# Patient Record
Sex: Male | Born: 1945 | Race: Black or African American | Hispanic: No | Marital: Married | Smoking: Never smoker
Health system: Southern US, Community
[De-identification: ages and names within clinical notes are randomized; demographics above are authoritative.]

## PROBLEM LIST (undated history)

## (undated) DIAGNOSIS — E785 Hyperlipidemia, unspecified: Secondary | ICD-10-CM

## (undated) DIAGNOSIS — M1990 Unspecified osteoarthritis, unspecified site: Secondary | ICD-10-CM

## (undated) DIAGNOSIS — I10 Essential (primary) hypertension: Secondary | ICD-10-CM

## (undated) DIAGNOSIS — T7840XA Allergy, unspecified, initial encounter: Secondary | ICD-10-CM

## (undated) DIAGNOSIS — K219 Gastro-esophageal reflux disease without esophagitis: Secondary | ICD-10-CM

## (undated) DIAGNOSIS — E119 Type 2 diabetes mellitus without complications: Secondary | ICD-10-CM

## (undated) HISTORY — PX: COLONOSCOPY: SHX174

## (undated) HISTORY — DX: Allergy, unspecified, initial encounter: T78.40XA

## (undated) HISTORY — DX: Unspecified osteoarthritis, unspecified site: M19.90

## (undated) HISTORY — DX: Hyperlipidemia, unspecified: E78.5

## (undated) HISTORY — DX: Gastro-esophageal reflux disease without esophagitis: K21.9

## (undated) HISTORY — DX: Essential (primary) hypertension: I10

## (undated) HISTORY — PX: KNEE SURGERY: SHX244

---

## 2001-10-09 ENCOUNTER — Encounter: Admission: RE | Admit: 2001-10-09 | Discharge: 2001-10-09 | Payer: Self-pay | Admitting: Occupational Medicine

## 2001-10-09 ENCOUNTER — Encounter: Payer: Self-pay | Admitting: Occupational Medicine

## 2002-02-11 ENCOUNTER — Encounter: Admission: RE | Admit: 2002-02-11 | Discharge: 2002-02-11 | Payer: Self-pay | Admitting: Occupational Medicine

## 2002-02-11 ENCOUNTER — Encounter: Payer: Self-pay | Admitting: Occupational Medicine

## 2003-09-18 HISTORY — PX: COLON SURGERY: SHX602

## 2004-07-07 ENCOUNTER — Encounter (INDEPENDENT_AMBULATORY_CARE_PROVIDER_SITE_OTHER): Payer: Self-pay | Admitting: *Deleted

## 2004-07-07 ENCOUNTER — Inpatient Hospital Stay (HOSPITAL_COMMUNITY): Admission: AC | Admit: 2004-07-07 | Discharge: 2004-07-21 | Payer: Self-pay

## 2004-07-07 ENCOUNTER — Ambulatory Visit: Payer: Self-pay | Admitting: Internal Medicine

## 2004-07-14 ENCOUNTER — Encounter: Payer: Self-pay | Admitting: Cardiovascular Disease

## 2004-07-24 ENCOUNTER — Ambulatory Visit: Payer: Self-pay | Admitting: Internal Medicine

## 2004-07-25 ENCOUNTER — Ambulatory Visit: Payer: Self-pay | Admitting: Internal Medicine

## 2004-07-27 ENCOUNTER — Ambulatory Visit: Payer: Self-pay | Admitting: Internal Medicine

## 2004-07-28 ENCOUNTER — Ambulatory Visit: Payer: Self-pay | Admitting: Family Medicine

## 2004-08-07 ENCOUNTER — Ambulatory Visit: Payer: Self-pay | Admitting: Family Medicine

## 2004-08-29 ENCOUNTER — Ambulatory Visit: Payer: Self-pay | Admitting: Family Medicine

## 2004-09-05 ENCOUNTER — Ambulatory Visit: Payer: Self-pay | Admitting: Family Medicine

## 2004-09-13 ENCOUNTER — Ambulatory Visit: Payer: Self-pay | Admitting: Family Medicine

## 2004-09-17 HISTORY — PX: COLON SURGERY: SHX602

## 2004-09-20 ENCOUNTER — Ambulatory Visit: Payer: Self-pay | Admitting: Family Medicine

## 2004-09-22 ENCOUNTER — Ambulatory Visit (HOSPITAL_COMMUNITY): Admission: RE | Admit: 2004-09-22 | Discharge: 2004-09-22 | Payer: Self-pay | Admitting: General Surgery

## 2004-09-26 ENCOUNTER — Ambulatory Visit (HOSPITAL_COMMUNITY): Admission: RE | Admit: 2004-09-26 | Discharge: 2004-09-26 | Payer: Self-pay | Admitting: General Surgery

## 2004-09-27 ENCOUNTER — Ambulatory Visit: Payer: Self-pay | Admitting: Family Medicine

## 2004-10-11 ENCOUNTER — Ambulatory Visit: Payer: Self-pay | Admitting: Family Medicine

## 2004-10-19 ENCOUNTER — Encounter (INDEPENDENT_AMBULATORY_CARE_PROVIDER_SITE_OTHER): Payer: Self-pay | Admitting: *Deleted

## 2004-10-19 ENCOUNTER — Inpatient Hospital Stay (HOSPITAL_COMMUNITY): Admission: RE | Admit: 2004-10-19 | Discharge: 2004-10-24 | Payer: Self-pay | Admitting: General Surgery

## 2004-11-01 ENCOUNTER — Ambulatory Visit: Payer: Self-pay | Admitting: Family Medicine

## 2004-11-07 ENCOUNTER — Ambulatory Visit: Payer: Self-pay | Admitting: Family Medicine

## 2004-11-10 ENCOUNTER — Emergency Department (HOSPITAL_COMMUNITY): Admission: EM | Admit: 2004-11-10 | Discharge: 2004-11-10 | Payer: Self-pay | Admitting: Emergency Medicine

## 2004-12-05 ENCOUNTER — Ambulatory Visit: Payer: Self-pay | Admitting: Family Medicine

## 2005-01-05 ENCOUNTER — Ambulatory Visit: Payer: Self-pay | Admitting: Family Medicine

## 2005-01-22 ENCOUNTER — Ambulatory Visit: Payer: Self-pay | Admitting: Internal Medicine

## 2005-02-05 ENCOUNTER — Ambulatory Visit: Payer: Self-pay | Admitting: Family Medicine

## 2005-02-21 ENCOUNTER — Ambulatory Visit: Payer: Self-pay | Admitting: Internal Medicine

## 2005-02-28 ENCOUNTER — Ambulatory Visit: Payer: Self-pay | Admitting: Internal Medicine

## 2005-10-18 ENCOUNTER — Ambulatory Visit: Payer: Self-pay | Admitting: Family Medicine

## 2006-02-06 ENCOUNTER — Ambulatory Visit: Payer: Self-pay | Admitting: Family Medicine

## 2006-04-02 ENCOUNTER — Ambulatory Visit: Payer: Self-pay | Admitting: Family Medicine

## 2006-04-09 ENCOUNTER — Ambulatory Visit: Payer: Self-pay | Admitting: Family Medicine

## 2006-04-16 ENCOUNTER — Encounter: Admission: RE | Admit: 2006-04-16 | Discharge: 2006-04-16 | Payer: Self-pay | Admitting: Family Medicine

## 2006-04-23 ENCOUNTER — Ambulatory Visit: Payer: Self-pay | Admitting: Family Medicine

## 2006-06-24 ENCOUNTER — Ambulatory Visit: Payer: Self-pay | Admitting: Family Medicine

## 2007-03-13 ENCOUNTER — Encounter: Payer: Self-pay | Admitting: Family Medicine

## 2007-03-13 DIAGNOSIS — I10 Essential (primary) hypertension: Secondary | ICD-10-CM | POA: Insufficient documentation

## 2007-03-13 DIAGNOSIS — K219 Gastro-esophageal reflux disease without esophagitis: Secondary | ICD-10-CM

## 2007-03-13 DIAGNOSIS — E785 Hyperlipidemia, unspecified: Secondary | ICD-10-CM

## 2007-03-13 DIAGNOSIS — Z86718 Personal history of other venous thrombosis and embolism: Secondary | ICD-10-CM

## 2007-03-31 ENCOUNTER — Ambulatory Visit: Payer: Self-pay | Admitting: Family Medicine

## 2007-03-31 LAB — CONVERTED CEMR LAB
ALT: 22 units/L (ref 0–53)
AST: 16 units/L (ref 0–37)
Basophils Relative: 0.2 % (ref 0.0–1.0)
Bilirubin, Direct: 0.1 mg/dL (ref 0.0–0.3)
Calcium: 9.4 mg/dL (ref 8.4–10.5)
Chloride: 102 meq/L (ref 96–112)
Creatinine, Ser: 1.3 mg/dL (ref 0.4–1.5)
Creatinine,U: 157.4 mg/dL
Eosinophils Relative: 2.5 % (ref 0.0–5.0)
Glucose, Bld: 118 mg/dL — ABNORMAL HIGH (ref 70–99)
HCT: 43.5 % (ref 39.0–52.0)
MCV: 85.8 fL (ref 78.0–100.0)
Neutrophils Relative %: 51.1 % (ref 43.0–77.0)
PSA: 0.24 ng/mL (ref 0.10–4.00)
RBC: 5.07 M/uL (ref 4.22–5.81)
RDW: 13.2 % (ref 11.5–14.6)
Sodium: 140 meq/L (ref 135–145)
TSH: 1.16 microintl units/mL (ref 0.35–5.50)
Total Bilirubin: 0.9 mg/dL (ref 0.3–1.2)
Total CHOL/HDL Ratio: 6.1
Triglycerides: 124 mg/dL (ref 0–149)
VLDL: 25 mg/dL (ref 0–40)
WBC: 6.2 10*3/uL (ref 4.5–10.5)

## 2007-04-07 ENCOUNTER — Ambulatory Visit: Payer: Self-pay | Admitting: Family Medicine

## 2007-04-07 DIAGNOSIS — N529 Male erectile dysfunction, unspecified: Secondary | ICD-10-CM | POA: Insufficient documentation

## 2007-04-17 ENCOUNTER — Encounter: Payer: Self-pay | Admitting: Family Medicine

## 2007-04-17 ENCOUNTER — Telehealth: Payer: Self-pay | Admitting: Family Medicine

## 2007-05-07 DIAGNOSIS — M67919 Unspecified disorder of synovium and tendon, unspecified shoulder: Secondary | ICD-10-CM | POA: Insufficient documentation

## 2007-05-07 DIAGNOSIS — M719 Bursopathy, unspecified: Secondary | ICD-10-CM

## 2007-07-07 ENCOUNTER — Ambulatory Visit: Payer: Self-pay | Admitting: Family Medicine

## 2008-04-02 ENCOUNTER — Ambulatory Visit: Payer: Self-pay | Admitting: Family Medicine

## 2008-04-06 ENCOUNTER — Ambulatory Visit: Payer: Self-pay | Admitting: Family Medicine

## 2008-04-06 LAB — CONVERTED CEMR LAB
AST: 37 units/L (ref 0–37)
Albumin: 4.2 g/dL (ref 3.5–5.2)
BUN: 19 mg/dL (ref 6–23)
Basophils Absolute: 0 10*3/uL (ref 0.0–0.1)
Basophils Relative: 0.7 % (ref 0.0–3.0)
Bilirubin Urine: NEGATIVE
Blood in Urine, dipstick: NEGATIVE
Calcium: 9.7 mg/dL (ref 8.4–10.5)
Cholesterol: 165 mg/dL (ref 0–200)
Creatinine, Ser: 1.2 mg/dL (ref 0.4–1.5)
Creatinine,U: 126 mg/dL
Eosinophils Absolute: 0.1 10*3/uL (ref 0.0–0.7)
Eosinophils Relative: 1.5 % (ref 0.0–5.0)
GFR calc Af Amer: 79 mL/min
GFR calc non Af Amer: 66 mL/min
Glucose, Urine, Semiquant: 100
HCT: 44.3 % (ref 39.0–52.0)
Hgb A1c MFr Bld: 8.9 % — ABNORMAL HIGH (ref 4.6–6.0)
MCHC: 34.4 g/dL (ref 30.0–36.0)
MCV: 87.4 fL (ref 78.0–100.0)
Microalb Creat Ratio: 10.3 mg/g (ref 0.0–30.0)
Microalb, Ur: 1.3 mg/dL (ref 0.0–1.9)
Monocytes Absolute: 0.6 10*3/uL (ref 0.1–1.0)
Neutro Abs: 2 10*3/uL (ref 1.4–7.7)
Neutrophils Relative %: 42.8 % — ABNORMAL LOW (ref 43.0–77.0)
PSA: 0.2 ng/mL (ref 0.10–4.00)
Protein, U semiquant: NEGATIVE
RBC: 5.07 M/uL (ref 4.22–5.81)
Specific Gravity, Urine: 1.02
Total Bilirubin: 1.3 mg/dL — ABNORMAL HIGH (ref 0.3–1.2)
VLDL: 34 mg/dL (ref 0–40)
WBC Urine, dipstick: NEGATIVE
WBC: 4.7 10*3/uL (ref 4.5–10.5)
pH: 5.5

## 2008-04-20 ENCOUNTER — Ambulatory Visit: Payer: Self-pay | Admitting: Family Medicine

## 2008-04-21 ENCOUNTER — Telehealth: Payer: Self-pay | Admitting: Family Medicine

## 2008-04-27 ENCOUNTER — Ambulatory Visit: Payer: Self-pay | Admitting: Family Medicine

## 2008-06-07 ENCOUNTER — Telehealth: Payer: Self-pay | Admitting: *Deleted

## 2008-07-22 ENCOUNTER — Ambulatory Visit: Payer: Self-pay | Admitting: Family Medicine

## 2008-07-22 LAB — CONVERTED CEMR LAB
BUN: 20 mg/dL (ref 6–23)
Creatinine, Ser: 1.2 mg/dL (ref 0.4–1.5)
GFR calc Af Amer: 79 mL/min
Glucose, Bld: 92 mg/dL (ref 70–99)
Potassium: 4.6 meq/L (ref 3.5–5.1)

## 2008-07-26 ENCOUNTER — Ambulatory Visit: Payer: Self-pay | Admitting: Family Medicine

## 2008-12-10 ENCOUNTER — Telehealth: Payer: Self-pay | Admitting: Family Medicine

## 2009-04-13 ENCOUNTER — Ambulatory Visit: Payer: Self-pay | Admitting: Family Medicine

## 2009-04-13 LAB — CONVERTED CEMR LAB
ALT: 42 units/L (ref 0–53)
AST: 30 units/L (ref 0–37)
Albumin: 4.1 g/dL (ref 3.5–5.2)
BUN: 22 mg/dL (ref 6–23)
Chloride: 109 meq/L (ref 96–112)
Creatinine, Ser: 1.3 mg/dL (ref 0.4–1.5)
GFR calc non Af Amer: 72.07 mL/min (ref >60)
Glucose, Bld: 77 mg/dL (ref 70–99)
Hgb A1c MFr Bld: 7.1 % — ABNORMAL HIGH (ref 4.6–6.5)
Potassium: 4.6 meq/L (ref 3.5–5.1)

## 2009-04-19 ENCOUNTER — Ambulatory Visit: Payer: Self-pay | Admitting: Family Medicine

## 2009-06-06 ENCOUNTER — Encounter: Payer: Self-pay | Admitting: Family Medicine

## 2009-06-23 ENCOUNTER — Telehealth: Payer: Self-pay | Admitting: Family Medicine

## 2009-07-18 ENCOUNTER — Telehealth: Payer: Self-pay | Admitting: Family Medicine

## 2009-07-25 ENCOUNTER — Telehealth: Payer: Self-pay | Admitting: Family Medicine

## 2009-07-28 ENCOUNTER — Ambulatory Visit: Payer: Self-pay | Admitting: Family Medicine

## 2009-07-28 LAB — CONVERTED CEMR LAB
Albumin: 4.2 g/dL (ref 3.5–5.2)
Alkaline Phosphatase: 48 units/L (ref 39–117)
Basophils Relative: 0.4 % (ref 0.0–3.0)
CO2: 33 meq/L — ABNORMAL HIGH (ref 19–32)
Chloride: 105 meq/L (ref 96–112)
Eosinophils Absolute: 0.1 10*3/uL (ref 0.0–0.7)
Glucose, Urine, Semiquant: NEGATIVE
HCT: 45.7 % (ref 39.0–52.0)
Hemoglobin: 15 g/dL (ref 13.0–17.0)
Lymphocytes Relative: 41.4 % (ref 12.0–46.0)
Lymphs Abs: 2.1 10*3/uL (ref 0.7–4.0)
MCHC: 32.9 g/dL (ref 30.0–36.0)
MCV: 90.2 fL (ref 78.0–100.0)
Microalb, Ur: 1.4 mg/dL (ref 0.0–1.9)
Monocytes Absolute: 0.5 10*3/uL (ref 0.1–1.0)
Neutro Abs: 2.3 10*3/uL (ref 1.4–7.7)
Nitrite: NEGATIVE
Potassium: 4.7 meq/L (ref 3.5–5.1)
RBC: 5.06 M/uL (ref 4.22–5.81)
Sodium: 145 meq/L (ref 135–145)
Specific Gravity, Urine: 1.02
Total CHOL/HDL Ratio: 5
Total Protein: 6.8 g/dL (ref 6.0–8.3)

## 2009-08-01 ENCOUNTER — Telehealth: Payer: Self-pay | Admitting: Family Medicine

## 2009-08-03 ENCOUNTER — Ambulatory Visit: Payer: Self-pay | Admitting: Family Medicine

## 2009-08-22 ENCOUNTER — Encounter (INDEPENDENT_AMBULATORY_CARE_PROVIDER_SITE_OTHER): Payer: Self-pay | Admitting: *Deleted

## 2009-10-28 ENCOUNTER — Ambulatory Visit: Payer: Self-pay | Admitting: Family Medicine

## 2009-10-28 LAB — CONVERTED CEMR LAB
CO2: 31 meq/L (ref 19–32)
Calcium: 9.4 mg/dL (ref 8.4–10.5)
Glucose, Bld: 97 mg/dL (ref 70–99)
Potassium: 4.4 meq/L (ref 3.5–5.1)
Sodium: 140 meq/L (ref 135–145)

## 2009-11-07 ENCOUNTER — Ambulatory Visit: Payer: Self-pay | Admitting: Family Medicine

## 2010-10-19 NOTE — Assessment & Plan Note (Signed)
Summary: 3 month rov/njr/pt rsc from bmp/cjr   Vital Signs:  Patient profile:   65 year old male Weight:      250 pounds Temp:     98.8 degrees F oral BP sitting:   122 / 82  (left arm) Cuff size:   regular  Vitals Entered By: Kern Reap CMA Duncan Dull) (November 07, 2009 1:51 PM)  Reason for Visit follow up dm  History of Present Illness: Billy Hansen is a 65 y/o male NS w DM the type1 comes in today for evaluation and have diabetes.  His current dose is Glucophage 1000 mg b.i.d., Glucotrol, 10 mg b.i.d. NovoLog 20 units nightly morning.  Blood sugars around 100, hemoglobin A1c7.0.  We discussed various options, including splitting the dose.  However, for now will increase it to 24 units nightly to see if we can't get his A1c back below 6.5, and only have to take one shot daily  Diabetes Management History:      He says that he is exercising.  Type of exercise includes: walk.  He is doing this 5 times per week.    Allergies: 1)  ! * Aleve  Past History:  Past medical, surgical, family and social histories (including risk factors) reviewed, and no changes noted (except as noted below).  Past Medical History: Reviewed history from 04/20/2008 and no changes required. Diabetes mellitus, type II GERD Hypertension ED Pulmonary embolism, hx of HX HEMATOCHEZIA Hyperlipidemia perforated colon from a leg of a chair to penetrate his rectum Diabetes mellitus, type I  Past Surgical History: Reviewed history from 03/13/2007 and no changes required. FX L KNEE  RT KNEE SCOPE CARTILAGE PERIRECTAL TEAR -FELL ON CHAIR-HAD COLOSTOMY WITH HARTMAN'S POUCH  COLOSTOMY REVERSAL  Family History: Reviewed history from 04/20/2008 and no changes required. father died at 21, hypertension, stroke, and congestive heart failure.  Mother died of a stroke at age 32, 3 brothers, one sister in good health  Social History: Reviewed history from 04/20/2008 and no changes  required. Occupation: Divorced Never Smoked Alcohol use-no Drug use-no Regular exercise-yes  Review of Systems      See HPI  Physical Exam  General:  Well-developed,well-nourished,in no acute distress; alert,appropriate and cooperative throughout examination   Impression & Recommendations:  Problem # 1:  DIABETES MELLITUS, TYPE I (ICD-250.01) Assessment Improved  His updated medication list for this problem includes:    Aspir-81 81 Mg Tbec (Aspirin) .Marland Kitchen... Take 1 tablet by mouth once a day    Glucophage 1000 Mg Tabs (Metformin hcl) .Marland Kitchen... Take 1 tablet by mouth twice a day    Glucotrol 10 Mg Tabs (Glipizide) .Marland Kitchen... Take 1 tablet by mouth twice a day    Lisinopril 30 Mg Tabs (Lisinopril) .Marland Kitchen... 1 tab bid    Novolog Mix 70/30 70-30 % Susp (Insulin aspart prot & aspart) .Marland Kitchen... 20 units once daily    Lisinopril 30 Mg Tabs (Lisinopril) .Marland Kitchen... Take 1 tablet by mouth two times a day  Complete Medication List: 1)  Aspir-81 81 Mg Tbec (Aspirin) .... Take 1 tablet by mouth once a day 2)  Glucophage 1000 Mg Tabs (Metformin hcl) .... Take 1 tablet by mouth twice a day 3)  Glucotrol 10 Mg Tabs (Glipizide) .... Take 1 tablet by mouth twice a day 4)  Hydrochlorothiazide 25 Mg Tabs (Hydrochlorothiazide) .Marland Kitchen.. 1 once daily 5)  Lisinopril 30 Mg Tabs (Lisinopril) .Marland Kitchen.. 1 tab bid 6)  Prilosec 20 Mg Cpdr (Omeprazole) .... Take 1 capsule by mouth every morning 7)  Viagra 100 Mg Tabs (Sildenafil citrate) .... Take as directed 8)  Bd Insulin Syringe U-100 1 Ml Misc (Insulin syringes (disposable)) .... Use two times a day 9)  Accu-chek Compact Strp (Glucose blood) .... Use one in the morning to check fasing blood sugar  dx 250.0 10)  Novolog Mix 70/30 70-30 % Susp (Insulin aspart prot & aspart) .... 20 units once daily 11)  Lisinopril 30 Mg Tabs (Lisinopril) .... Take 1 tablet by mouth two times a day 12)  Zocor 40 Mg Tabs (Simvastatin) .Marland Kitchen.. 1 tab @ bedtime  Patient Instructions: 1)  increase the insulin  dose to 24 units at bedtime, continue the oral medications 2)  Please schedule a follow-up appointment in 3 months. 250.01 3)  BMP prior to visit, ICD-9: 4)  HbgA1C prior to visit, ICD-9:

## 2011-02-02 NOTE — Discharge Summary (Signed)
NAME:  Billy Hansen, Billy Hansen NO.:  [id number]   MEDICAL RECORD NO.:  [id number]          PATIENT TYPE:  INP   LOCATION:  5708                         FACILITY:  [facility]   PHYSICIAN:  Cherylynn Ridges, M.D.    DATE OF BIRTH:  07/18/1946   DATE OF ADMISSION:  10/19/2004  DATE OF DISCHARGE:  10/24/2004                                 DISCHARGE SUMMARY   ADMITTING TRAUMA SURGEON:  Cherylynn Ridges, M.D.   CONSULTANTS:  None.   DISCHARGE DIAGNOSIS:  Status post reversal colostomy.   PROCEDURE:  Reversal colostomy.   HISTORY ON ADMISSION:  Billy Hansen is well known to the trauma service from  a previous admission back in late October 2005 when he was injured,  suffering a perirectal wound.  He had to undergo exploratory laparotomy  which revealed a rectal tear at about 20 cm.  He underwent a diverting end  colostomy with Luz Brazen pouch per Dr. Lindie Spruce and Dr. Janee Morn and had no  complications following those.  During his hospitalization it was discovered  he had significant untreated hypertension and hyperglycemia.  These were  addressed by the medicine service.  He had a further complication of  pulmonary embolus and was placed on heparin and then later Coumadin.  He was  followed in the trauma clinic over the past several months and has done  quite well.  It was felt that he should do well with takedown of his  colostomy and he was admitted for this reason on October 19, 2004.  He was,  of course, taken off his Coumadin in the preoperative period and his INR had  normalized prior to his surgery.  He underwent this procedure without any  complications.  He had an obligatory ileus postoperatively but this rapidly  resolved.  He was initially kept off of all anticoagulants but was able to  be restarted on subcutaneous heparin on postop day #2 and then his Coumadin  was resumed on postoperative day #4.  He has tolerated all this extremely  well, his incision is healing well and he is  tolerating a regular diet and  ready to be discharged at this time.   DISCHARGE MEDICATIONS:  1.  Coumadin 10 mg one p.o. daily.  2.  Vicodin one to two p.o. q.4-6h. p.r.n. more severe pain.  3.  Hydrochlorothiazide 25 mg one p.o. daily.  4.  Lisinopril 30 mg p.o. daily.  5.  Metoprolol 100 mg p.o. b.i.d.   He will need to follow up with his primary physician, Dr. Alonza Smoker, for pro  time/INR next week.  He will follow up with the trauma service week on  October 31, 2004 at approximately 9:30 a.m. for staple removal and recheck  following his takedown colostomy.    SR/MEDQ  D:  10/24/2004  T:  10/24/2004  Job:  [id number]

## 2011-02-02 NOTE — Op Note (Signed)
NAME:  Billy Hansen, Billy Hansen NO.:  [id number]   MEDICAL RECORD NO.:  [id number]          PATIENT TYPE:  OBV   LOCATION:  5733                         FACILITY:  [facility]   PHYSICIAN:  Jimmye Norman III, M.D.  DATE OF BIRTH:  03/09/1946   DATE OF PROCEDURE:  07/07/2004  DATE OF DISCHARGE:                                 OPERATIVE REPORT   PREOPERATIVE DIAGNOSIS:  Rectal and perirectal tear status post accidental  fall on impelling chair leg.   POSTOPERATIVE DIAGNOSIS:  Rectal and perirectal tear status post accidental  fall on impelling chair leg with rectal tear at about 20 centimeters.   OPERATION PERFORMED:  1.  Rigid sigmoidoscopy.  2.  Exploration, debridement and repair of perirectal and rectal tear.  3.  Diverting end colostomy with Hartmann pouch.   SURGEON:  Jimmye Norman, M.D.   ASSISTANT:  Gabrielle Dare. Janee Morn, M.D.   ANESTHESIA:  General endotracheal.   ESTIMATED BLOOD LOSS:  A hundred to 150 mL.   COMPLICATIONS:  None.   CONDITION:  Stable.   FINDINGS:  The patient had a perirectal tear, which came in the right  gluteal cheek near the rectum, extended up the presacral area about half a  centimeter from the anal verge, extending into the anal verge and causing  some tear of the serosa of the rectum at about ___________ meters based on  rigid sigmoidoscopy.  There was no intraperitoneal  perforation.   DESCRIPTION OF OPERATION:  The patient was brought to the operating room and  placed on the table initially in the supine position.  After an adequate  endotracheal anesthetic was administered he was placed in the lithotomy  position and prepped after a rigid sigmoidoscopy was done.   A rigid sigmoidoscopy was done with the patient in lithotomy position.  We  were able to pass the rigid scope up to 22 cm without difficulty.  There was  minimal trauma during the process with a good amount of lubrication and at  about 20 cm there was what appeared to be a  posteriorly lateral to the right  wall hematoma and/or laceration without full-thickness exposure, but what  appeared to be perhaps secondary to the impelled objected.  Also as we  brought to the sigmoidoscope out through the anal verge you could see tears  of the skin all the way up to the anal verge  making it at risk for  contamination with bowel movements; and, the decision was made to do a  repair with a drain and also a diverting end colostomy.   We irrigated the presacral area with copious amounts of warm  half strength  Betadine solution and then with saline solution.  This was done with a bulb  syringe; and, approximately a liter to a liter-and-a-half of saline and  Betadine were used.  The bulb was extended all the way into the presacral  area and then aspirated out.  We then placed a 19 [place] Blake drain into  the presacral space  and sutured it in place with a 2-0 nylon.  We closed  the tear  after debriding it and refreshing the edges.  The tear was started  at the mucosal anal verge and suture approximately 5-6 cm downward towards  the posterior wall using a running stitch of 3-0 chromic.  The rest of the  incision was about 6 cm long also down to the point where the drain came  out, sort of a gluteal T; was closed using a running 3-0 Prolene.  Again,  the drain itself was sutured in place with a 2-0 nylon.  Once this was done  a sterile dressing was applied and we went ahead and placed the patient's  legs down in a supine position, and did a colostomy.   We changed gowns and gloves, resterilized our hands and then prepped the  abdomen in the usual sterile manner for a colostomy.  A midline incision was  made using a #10 blade and it was taken from just above the umbilicus on the  right side down to the pubis.  We went down through the midline fascia using  cautery.  Once we were in the peritoneal cavity we had to figure out the  patient's anatomy carefully because he had a  very redundant sigmoid colon,  which looped up to the right and was distended from the previous  sigmoidoscopy.  It looped up to the right and then went back down towards  the rectum.  There was a redundant loop times two and as it came up to the  right again it went over to the left side of the abdomen where it continued  on with the descending colon.  We transected the midsigmoid colon using a  GIA-75 stapler and left the Hartmann pouch in the pelvis unmarked, but it  was so large, protuberant, that it came right up to the midline above the  bladder.   We had to mobilize the mesentery and the proximal sigmoid colon carefully as  it was very redundant and had a mesentery, which was attached to the right  side of the abdomen.  In doing so we did take some of the deeper mesenteric  vessels leaving a viable end to bring out through the stomal site, which was  placed on the left side.  As we mobilized the colon it did so easily and we  subsequently were able to bring it out through an ostomy site made in the  left lateral wall with the Kocher clamp and  #10 blade.  Once we closed the  fascia we secured it with 3-0 Vicryl pop off sutures.  We irrigated with  saline in the abdomen and then closed using a #1 PDS suture.  Once that was  done the skin was closed using stainless steel staples.   All counts were correct including needles, sutures, sponges and instruments.  Once this was done a sterile dressing was applied and as mentioned we cutoff  the distal 5 inches of colon that had been brought out through the left side  of the abdomen and then attached the transected mucosal edge to the skin,  which appeared to be viable and bled easily.       JW/MEDQ  D:  07/07/2004  T:  07/08/2004  Job:  [id number]

## 2011-02-02 NOTE — Letter (Signed)
August 19, 2006    Ree Kida B. Vanna Scotland., Esq.  [facility] [place], [place]  [address]  [place], [place] [address]   RE:  KRISS, ISHLER  MRN:  [id number]  /  DOB:  [birth year]   Dear Mr. Lennon Alstrom,   I am Mr. Julius Bowels family physician and I would like to comment  on his sequelae from the injury described from the fall at [facility].   Mr. Devera is a 65 year old male who is a primary care patient of mine  at [facility]. I have seen him since he was released from the  hospital following his injury dated July 07, 2004. As you know on  July 07, 2004, Mr. Novosel reports he was working at [employer]. 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 After being impaled for some time, he disimpaled  himself and went to seek help. He was taken to the emergency room and  subsequently admitted to the hospital and treated by Dr. Marta Lamas. Lindie Spruce,  III, MD for severe rectal and perirectal tear from the penetrating  wound. The leg of the chair penetrated through the skin, the muscle and  the perirectal area of his buttocks on the right side. During the first  surgical procedure, it was noted the chair caused a hematoma and  laceration of the right wall of the rectum. There was no full-thickness  perforation. This injury to the rectum did not require surgical repair.  During the first procedure, a [place] drain was placed into the  perisacral area and sutured in place. After debridement and trimming  away the edges, the tear was sutured for a distance of approximately 6  cm towards the posterior wall of the rectum. The rest of the incision  was about 6 cm long which was sutured down to a point where the drain  came out in a T-shaped area around the gluteal fold. Some tearing in the  anal verge ws also noted. Because of the nature of the  injury, it was  necessary to perform a diverting colostomy. This was the second  procedure performed on the same day after the repair of the perirectal  laceration. This diverting colostomy required an abdominal incision  through the skin of the anterior abdominal wall. The incision went from  just above the umbilicus to the right side of the pubic area. Dr. Lindie Spruce  at that time transected the mid sigmoid colon, excised approximately 5  inches of his colon. A second abdominal incision was made in the left  side of the abdomen so the colon could be attached to the ostomy site.  The abdomen was closed in layers and Mr. Lottman tolerated that surgical  procedure well.   He did experience a pulmonary emboli in the left lower lobe of the lung  which was a complication during his first hospitalization. This was  treated and he was placed on anticoagulation, did well and had no  sequelae.   After 2 weeks in the hospital, Mr. Boehning was discharged July 21, 2004 with a functioning colostomy. The healing of his rectal and  perirectal tear continued slowly but without significant incident.   Three months later on October 19, 2004, Dr. Lindie Spruce performed a second  surgery to take down the colostomy and reanastomose Mr. Pfiester  bowels.  During this procedure, Dr. Lindie Spruce noted extensive adhesions of  the small and large bowel which he was able to take down. Adhesions as  you well know are troublesome and not only troublesome but can cause  bowel obstruction later on. Formation of these adhesions is not an  uncommon event in light of his surgical procedures of the abdomen,  colostomy, etc.   During this second surgery on October 19, 2004, 6 inches of stoma was  resected and the large bowel was then reanastomosed. A few weeks after  that first surgical procedure on February 24, Mr. Lenhoff experienced  complications and returned to the hospital and was treated with IV  antibiotics for perirectal  cellulitis.   SUMMARY OF MR Samarin'S INJURIES:  1. After Mr. Sallade last complication, he continued to have      complications with rectal incontinence. He was sent to see our GI      folks who did a complete evaluation. They felt that over time, the      incontinence would resolve although he would never have 100%      function of his rectal sphincter because of the trauma. He also      will have long-term problems sitting for more than 15, 20 to 30      minutes at a time because of scarring around the perirectal area      from the surgical procedure and the trauma of the initial injury.  2. Mr. Longley also sustained significant damage to the skin and      muscles of the buttocks area. Because of its location, it would not      be a cosmetic issue; however, again he will have trouble sitting      for long periods of time and he is unable to lift anything over 50      pounds and therefore this limits what kind of work he can do.  3. Because of the tear in the anal verge in the wound, there is some      loss of function of the sphincter as noted above. He now has fairly      reasonable control of his bowels. He has to be extremely careful      what he eats and avoid constipation.  4. Because of the injury, he had a significant trauma to the distal      rectum. He was extremely fortunate that the area healed and the      colostomy was functioning well and that he was then able to be      reanastomosed but obviously this can cause problems in the future.  5. Because of the abdominal surgeries and then a second final      subsequent reanastomosis of his colostomy, there is some loss of      skin function in the anterior abdominal wall with scarring. This      causes some pruritus and some tenderness which he will experience      for a lifetime. He is also more at risk for developing a hernia in      that area because of the incision through the anterior abdominal     wall. Again he has  to be very careful about lifting and he cannot      lift anything much over 50 pounds.  6. Because of the scarring on the anterior abdominal wall from the      surgical procedures, Mr. Blew is much more  susceptible for back      pain. As you know, the anterior abdominal wall muscle supports 50%      of the back function and therefore this may be a problem in the      future. Again this limits heavy lifting which may limit the work he      can do.  7. Because of the multiple abdominal operations, Mr. Bisig developed      adhesions as noted above. Dr. Lindie Spruce attempted to remove all he      could see but that has potential for causing small bowel      obstruction in the future. When one presents with small bowel      infarction from adhesions, often times a portion of the intestine      has to be removed. These surgical procedures again put Mr. Worley      at high risk for having bowel obstruction in the future. In      addition, any further abdominal surgery that he may require in the      future is likely to be extremely problematic because of the      underlying adhesions that he now has.  8. The sigmoid colon had 2 surgical procedures including the initial      resection. Subsequent colostomy and then the reanastomosis and a      third operation. Because of this, again, it would be difficult in      the future to evaluate him for colon cancer via colonoscopy.  9. During his initial hospitalization, he was noted to have left lower      lobe pulmonary emboli which is a complication of pelvic trauma and      venous stasis. Fortunately this was diagnosed in early stage. He      underwent anticoagulation and was maintained on that for 6 months      subsequent to surgery and did well and had no pulmonary      complications at that time. However, we do know that people who      have pulmonary emboli are more prone to secondary infection, etc.      down the road.  10.Because of the trauma  to the pelvic area and the nerves around the      rectum and the prostate area, Mr. Fawver now is impotent. Prior to      that, he used half of a 50 mg Viagra tablet to help with his sexual      function. Subsequently medications have not worked and I believe      this is a secondary phenomena because of the trauma to the nerves      around the prostate area from the initial injury.   Multiple medications have been tried without improvement and I suspect  this will be a permanent problem.   So in summary, the areas that were injured are as follows:  1. Skin and muscles of the buttocks.  2. Rectal, perirectal and anal sphincter.  3. Trauma to the rectum as noted above.  4. Surgery with injury to the skin and muscles of the anterior      abdominal wall.  5. Surgical removal attachment of the stoma.  6. Surgery with injuries to the sigmoid colon.  7. Surgery with injuries to the abdominal wall and omentum.  8. Adhesions to the small bowel and large bowel. 9. Multiple pulmonary emboli of the left lower lung.  10.Scarring of the  buttocks and abdominal wall from surgical trauma.  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   Considering the trauma that Mr. Bigley experienced, I think he is  overall doing well. He is able to function, he is able to work, however,  he does have limitations as noted above which will be permanent.  However, because of the trauma, he will never be as he was before the  injury.   Case of Rosaire Cueto. [facility] vs[facility] file  [phone].    Sincerely,      Eugenio Hoes. Tawanna Cooler, MD  Electronically Signed    JAT/MedQ  DD: 08/19/2006  DT: 08/19/2006  Job #: [id number]

## 2011-02-02 NOTE — Op Note (Signed)
NAME:  Billy Hansen, GRUENHAGEN NO.:  [id number]   MEDICAL RECORD NO.:  [id number]          PATIENT TYPE:  INP   LOCATION:  2550                         FACILITY:  [facility]   PHYSICIAN:  Cherylynn Ridges III, M.D.DATE OF BIRTH:  [birth year]   DATE OF PROCEDURE:  10/19/2004  DATE OF DISCHARGE:                                 OPERATIVE REPORT   PREOPERATIVE DIAGNOSES:  Functioning colostomy status post, rectal injury.   POSTOPERATIVE DIAGNOSES:  Functioning colostomy status post, rectal injury.   OPERATION PERFORMED:  Takedown colostomy.   SURGEON:  Marta Lamas. Lindie Spruce, M.D.   ASSISTANT:  Adolph Pollack, M.D.   ANESTHESIA:  General endotracheal.   ESTIMATED BLOOD LOSS:  Less than 100 mL.   COMPLICATIONS:  None.   CONDITION:  Stable.   FINDINGS:  The patient had extensive adhesions of the large and small bowel  but no inadvertent enterotomies were made.   DESCRIPTION OF PROCEDURE:  The patient was taken to the operating room and  placed on the table in the supine position.  After an adequate endotracheal  anesthetic was administered, his colostomy was closed using a running  locking stitch of 2-0 silk.  We then prepped and draped in the usual sterile  manner exposing the midline and the colostomy site.   A midline incision was made using a #10 blade below the umbilicus excising  the patient's previously thickened and hypertrophic scar.  It was taken down  to and through the midline fascia.  Once we had gotten to the peritoneal  cavity and the upper portion of the wound without injury of bowel, we could  see extensive adhesions of the small bowel to the anterior abdominal wall to  the previous distal stump of the Hartmann's stump and also to other  surrounding intestines.  The hypertrophic reaction of scarring on the  patient's skin and at his incision was reminiscent of the scarring that was  noted intra-abdominally.   Considerable time was taken to remove down  adhesions from the ligament of  Treitz down to the terminal ileum but this was done.  We were subsequently  able to dissect out the colostomy from its site using a #10 blade then the  electrocautery to dissect it out of the subcu pocket.  There was extensive  scarring here also.  We eventually were able to get the stoma to go back  into the peritoneal cavity and we closed the stoma site at the end of the  case using internal simple stitches of #1 Vicryl and external fascial  stitches of #1 Vicryl.  Once the stoma was retrieved back into the  peritoneal cavity, we resected about six inches of the stomach to anastomose  to the distal sigmoid stump which was obvious in the lower portion of the  abdomen.  We freshened up both ends proximally and distally so that we could  get good serosal stitches of Lembert stitches of 3-0 silk.  This was done  attaching the back walls of the proximal and distal colon and then a full-  thickness and Connell stitch of 3-0  Vicryl was placed on the inner layer.  The anterior wall was reapproximated using 3-0 silk Lembert stitches and the  colon fell easily over into the left lower quadrant with minimal difficulty  with an adequate stoma.  Once the anastomosis was completed, we irrigated  with saline solution.  We could not get any of the omentum to fall back into  the peritoneal cavity as it was adhered in the left upper quadrant.  We did  palpate the NG tube which was in adequate position.   Once the abdomen was closed, then the stoma site was closed as mentioned  previously.  We irrigated with saline on both sides.  Then we closed the  midline fascia using a running #1 PDS suture.  The subcu was irrigated with  saline and the skin was closed using stainless steel staples.  Sterile  dressings were applied.  All sponge, needle and instrument counts were  correct at the conclusion of the case.      JOW/MEDQ  D:  10/19/2004  T:  10/19/2004  Job:  [id number]

## 2011-02-02 NOTE — Discharge Summary (Signed)
NAME:  Billy Hansen, Billy Hansen NO.:  [id number]   MEDICAL RECORD NO.:  [id number]          PATIENT TYPE:  INP   LOCATION:  5742                         FACILITY:  [facility]   PHYSICIAN:  Jimmye Norman, M.D.      DATE OF BIRTH:  [birth year]   DATE OF ADMISSION:  07/07/2004  DATE OF DISCHARGE:  07/21/2004                                 DISCHARGE SUMMARY   CONSULTING PHYSICIAN:  Medical Teaching Service.   FINAL DIAGNOSES:  1.  Fall.  2.  Penetrating puncture wound to perirectal area.  3.  Diabetes mellitus.  4.  Hypertension.   PROCEDURES:  Exploratory laparotomy with diverting colostomy performed on  July 07, 2004 by Dr. Jimmye Norman.   HISTORY:  This is a 65 year old African American male who was standing on  some chairs which were stacked up when he lost his balance and fell off and  an upturned chair leg caught him in the buttock area, and it penetrated just  lateral to the rectum.  The patient was brought to the [facility] Emergency  Room.  He was seen in the emergency room by Dr. Lindie Spruce.  Chest x-ray and  pelvis x-ray were negative.  He was subsequently taken to the OR for  exploration.   HOSPITAL COURSE:  He underwent exploration of the wound in the OR.  A  diverting colostomy was performed.  The penetrating chair leg caused some  bruising and swelling around the sigmoid area, but no puncture was noted.  We did a sigmoidoscopy and it was negative for any penetration of the  sigmoid.  The patient did tolerate the procedure well and no intraoperative  complications occurred.  Postoperatively, the patient's problems recurred  with hyperglycemia and hypertension.  The patient had known he was a mild  hypertensive initially, but he had been noncompliant with his medications.  At the time of arrival, he denied taking any medications.  What was noted  was that he did have an elevated glucose level too and a hemoglobin A1c was  performed which was elevated above normal.   Because of these findings,  medical teaching service was consulted to see the patient and they followed  [place] with the patient.  They treated him for his hypertension and for his  hyperglycemia.  He was started on Lantus subcu daily.  He had Glucotrol.  The patient was started on antihypertensives which were hydrochlorothiazide,  lisinopril and Lopressor.  He would start on Metformin later on after they  do some liver function tests.  He did have a liver function test which was  elevated and he subsequently ordered hepatitis profile which was pending at  the time of this dictation.  He was put on Coumadin and started on this, and  will continue on this for some time, as he did develop a PE while in the  hospital.  This did not cause any further complications while in the  hospital.   DISCHARGE MEDICATIONS:  He was on these medicines at the time of discharge:  1.  Hydrochlorothiazide 25 mg 1 daily.  2.  Lopressor  100 mg b.i.d.  3.  Lisinopril 60 mg 1 daily.  4.  Glucotrol 10 mg 1 daily.  5.  Lantus insulin 14 units subcu at bedtime.  6.  Coumadin 10 mg on Saturday, 7.5 mg on Sunday and Monday.  7.  The patient was given Percocet 1-2 p.o. q.4-6 h. p.r.n. for pain.   FOLLOWUP:  He will be followed up at the clinic with Dr. Judie Grieve  on Monday, the 7th of November, at 2:30 p.m.  He will also follow up with  Dr. Michaell Cowing in the [facility] for continued monitoring of his INR level.  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  He does have a colostomy which  is functioning well at the time of discharge and we will need to schedule a  time for a takedown in the next few months.   CONDITION ON DISCHARGE:  He is actually doing well at this time, tolerating  a diet satisfactorily.  He did have a postoperative ileus which did resolve  without any difficulty.  As noted, the colostomy is functioning   satisfactorily at this time.  No other problems occurred.  He was  subsequently discharged home in satisfactory and stable condition on  July 21, 2004.       CL/MEDQ  D:  07/21/2004  T:  07/22/2004  Job:  [id number]   cc:   Judie Grieve, MD  Fax: [phone]

## 2012-07-22 ENCOUNTER — Telehealth: Payer: Self-pay | Admitting: Family Medicine

## 2012-07-22 NOTE — Telephone Encounter (Signed)
Pt is on medicare now wondering will MD still see him. Last appt 2011.

## 2012-07-22 NOTE — Telephone Encounter (Signed)
Fleet Contras please call will continue to see no problem

## 2012-07-24 NOTE — Telephone Encounter (Signed)
lmom for pt to callback to sch appt °

## 2012-07-25 NOTE — Telephone Encounter (Signed)
Pt is sch for cpx 10-25-2011

## 2012-10-29 ENCOUNTER — Ambulatory Visit (INDEPENDENT_AMBULATORY_CARE_PROVIDER_SITE_OTHER): Payer: Medicare Other | Admitting: Family Medicine

## 2012-10-29 ENCOUNTER — Encounter: Payer: Self-pay | Admitting: Family Medicine

## 2012-10-29 VITALS — BP 160/100 | Temp 98.7°F | Ht 70.75 in | Wt 237.0 lb

## 2012-10-29 DIAGNOSIS — E785 Hyperlipidemia, unspecified: Secondary | ICD-10-CM

## 2012-10-29 DIAGNOSIS — E119 Type 2 diabetes mellitus without complications: Secondary | ICD-10-CM

## 2012-10-29 DIAGNOSIS — Z23 Encounter for immunization: Secondary | ICD-10-CM

## 2012-10-29 DIAGNOSIS — N529 Male erectile dysfunction, unspecified: Secondary | ICD-10-CM

## 2012-10-29 DIAGNOSIS — I10 Essential (primary) hypertension: Secondary | ICD-10-CM

## 2012-10-29 DIAGNOSIS — K219 Gastro-esophageal reflux disease without esophagitis: Secondary | ICD-10-CM

## 2012-10-29 DIAGNOSIS — Z0000 Encounter for general adult medical examination without abnormal findings: Secondary | ICD-10-CM

## 2012-10-29 LAB — CBC WITH DIFFERENTIAL/PLATELET
Eosinophils Relative: 2.6 % (ref 0.0–5.0)
HCT: 49.7 % (ref 39.0–52.0)
Hemoglobin: 16.4 g/dL (ref 13.0–17.0)
Lymphs Abs: 2 10*3/uL (ref 0.7–4.0)
Monocytes Relative: 9.6 % (ref 3.0–12.0)
Neutro Abs: 2.3 10*3/uL (ref 1.4–7.7)
RBC: 5.81 Mil/uL (ref 4.22–5.81)
WBC: 4.8 10*3/uL (ref 4.5–10.5)

## 2012-10-29 LAB — LIPID PANEL
HDL: 34.8 mg/dL — ABNORMAL LOW (ref >39.00)
Total CHOL/HDL Ratio: 6
Triglycerides: 193 mg/dL — ABNORMAL HIGH (ref 0.0–149.0)

## 2012-10-29 LAB — BASIC METABOLIC PANEL
Creatinine, Ser: 1 mg/dL (ref 0.4–1.5)
Glucose, Bld: 222 mg/dL — ABNORMAL HIGH (ref 70–99)
Potassium: 4.4 mEq/L (ref 3.5–5.1)
Sodium: 139 mEq/L (ref 135–145)

## 2012-10-29 LAB — POCT URINALYSIS DIPSTICK
Bilirubin, UA: NEGATIVE
Blood, UA: NEGATIVE
Ketones, UA: NEGATIVE
Leukocytes, UA: NEGATIVE
pH, UA: 5.5

## 2012-10-29 LAB — MICROALBUMIN / CREATININE URINE RATIO
Creatinine,U: 167.3 mg/dL
Microalb, Ur: 4.9 mg/dL — ABNORMAL HIGH (ref 0.0–1.9)

## 2012-10-29 LAB — PSA: PSA: 0.33 ng/mL (ref 0.10–4.00)

## 2012-10-29 LAB — HEMOGLOBIN A1C: Hgb A1c MFr Bld: 10.9 % — ABNORMAL HIGH (ref 4.6–6.5)

## 2012-10-29 LAB — HEPATIC FUNCTION PANEL: Total Bilirubin: 1.4 mg/dL — ABNORMAL HIGH (ref 0.3–1.2)

## 2012-10-29 MED ORDER — LISINOPRIL 20 MG PO TABS: 20.0000 mg | ORAL_TABLET | Freq: Every day | ORAL | Status: DC

## 2012-10-29 MED ORDER — SIMVASTATIN 20 MG PO TABS: 20.0000 mg | ORAL_TABLET | Freq: Every evening | ORAL | Status: DC

## 2012-10-29 MED ORDER — GLIPIZIDE 10 MG PO TABS: 10.0000 mg | ORAL_TABLET | Freq: Every day | ORAL | Status: DC

## 2012-10-29 MED ORDER — FUROSEMIDE 20 MG PO TABS: 20.0000 mg | ORAL_TABLET | Freq: Every day | ORAL | Status: DC

## 2012-10-29 MED ORDER — METFORMIN HCL 1000 MG PO TABS: 1000.0000 mg | ORAL_TABLET | Freq: Two times a day (BID) | ORAL | Status: DC

## 2012-10-29 NOTE — Progress Notes (Signed)
Subjective:    Patient ID: Billy Hansen, male    DOB: [birth year], 67 y.o.   MRN: [id number]  HPI Billy Hansen is a 67 year old married male nonsmoker who comes in today after a 3 year absence to reestablish. He lost his health insurance and has been seeing Dr. Bruna Potter for medication refills no recent lab work  He states he checked his blood sugar about a month ago was in the 200 range.  He's taking glipizide 10 mg daily and metformin 1000 mg twice a day Lasix 20 mg daily along with lisinopril 20 mg daily for hypertension and simvastatin 20 mg daily for hyperlipidemia  He had the one hospitalization about 10 years ago when he had an accident at work and the leg was chair when up his rectum. He had been reconstructed by Dr. Lindie Spruce  Does not get routine eye care recommended Dr. Gweneth Dimitri. He does not get regular dental care other he does brush and floss daily. He's had a colonoscopy in 04 due for followup  No history of any renal problems no erectile dysfunction he's lost weight he was down to 237. He's not been trying to lose weight. He had surgery on his right knee many years ago with arthritis.  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 Information given on shingles  Diabetic review of systems negative no symptoms of neuropathy   Review of Systems  Constitutional: Negative.   HENT: Negative.   Eyes: Negative.   Respiratory: Negative.   Cardiovascular: Negative.   Gastrointestinal: Negative.   Genitourinary: Negative.   Musculoskeletal: Negative.   Skin: Negative.   Neurological: Negative.   Psychiatric/Behavioral: Negative.        Objective:   Physical Exam  Constitutional: He is oriented to person, place, and time. He appears well-developed and well-nourished.  HENT:  Head: Normocephalic and atraumatic.  Right Ear: External  ear normal.  Left Ear: External ear normal.  Nose: Nose normal.  Mouth/Throat: Oropharynx is clear and moist.  Eyes: Conjunctivae and EOM are normal. Pupils are equal, round, and reactive to light.  Neck: Normal range of motion. Neck supple. No JVD present. No tracheal deviation present. No thyromegaly present.  Cardiovascular: Normal rate, regular rhythm, normal heart sounds and intact distal pulses.  Exam reveals no gallop and no friction rub.   No murmur heard. Vascular exam normal no carotid bruits aorta normal peripheral pulses normal  Pulmonary/Chest: Effort normal and breath sounds normal. No stridor. No respiratory distress. He has no wheezes. He has no rales. He exhibits no tenderness.  Abdominal: Soft. Bowel sounds are normal. He exhibits no distension and no mass. There is no tenderness. There is no rebound and no guarding.  Scar bid lower abdomen more he had the large bowel repair  Genitourinary: Rectum normal, prostate normal and penis normal. Guaiac negative stool. No penile tenderness.  Musculoskeletal: Normal range of motion. He exhibits no edema and no tenderness.  Lymphadenopathy:    He has no cervical adenopathy.  Neurological: He is alert and oriented to person, place, and time. He has normal reflexes. No cranial nerve deficit. He exhibits normal muscle tone.  No neuropathy  Skin: Skin is warm and dry. No rash noted. No erythema. No pallor.  Psychiatric: He has a normal mood and affect. His behavior is normal. Judgment and thought content normal.  Assessment & Plan:  Healthy male  Hypertension question goal continue lisinopril and Lasix BP check daily followup in 3 weeks  Hyperlipidemia check labs continue simvastatin and an aspirin tablet  Diabetes type 2 question goal continue glipizide and metformin..... Check labs

## 2012-10-29 NOTE — Patient Instructions (Signed)
Continue your current medications  We will call you on the phone to make any adjustments  Purchase a pump up digital blood pressure cuff and check your blood pressure every morning  Check a fasting blood sugar Monday Wednesday Friday  Return in 3 weeks for followup

## 2012-11-06 ENCOUNTER — Ambulatory Visit (INDEPENDENT_AMBULATORY_CARE_PROVIDER_SITE_OTHER): Payer: Medicare Other | Admitting: Family Medicine

## 2012-11-06 ENCOUNTER — Encounter: Payer: Self-pay | Admitting: Family Medicine

## 2012-11-06 VITALS — BP 130/90

## 2012-11-06 DIAGNOSIS — I10 Essential (primary) hypertension: Secondary | ICD-10-CM

## 2012-11-06 DIAGNOSIS — E119 Type 2 diabetes mellitus without complications: Secondary | ICD-10-CM

## 2012-11-06 NOTE — Progress Notes (Signed)
  Subjective:    Patient ID: Billy Hansen, male    DOB: 17-Aug-1946, 67 y.o.   MRN: [id number]  HPI Billy Hansen is a 67 year old male who comes in today for followup of diabetes and hypertension  We saw him last week after a three-year absence. His blood sugar was elevated and we do some basic labs. Times that his hemoglobin A1c was markedly elevated however he says he was only taking his medication every other day. Since he's been taking his medication daily his blood sugars decreased but only down to 230 range.  BP normal 130/90   Review of Systems Asymptomatic no neuropathy vision normal    Objective:   Physical Exam  Well-developed well-nourished male in no acute distress      Assessment & Plan:  Diabetes type 2 not at goal plan continue current meds

## 2012-11-06 NOTE — Patient Instructions (Addendum)
Continue your current diabetic medication  Really tighten up on your diet  Walk 30 minutes daily  Check a fasting blood sugar daily in the morning  Followup in 3 weeks with all your blood sugar readings

## 2012-11-19 ENCOUNTER — Ambulatory Visit: Payer: Medicare Other | Admitting: Family Medicine

## 2012-12-11 ENCOUNTER — Ambulatory Visit: Payer: Medicare Other | Admitting: Family Medicine

## 2012-12-18 ENCOUNTER — Other Ambulatory Visit: Payer: Self-pay | Admitting: *Deleted

## 2012-12-18 DIAGNOSIS — I10 Essential (primary) hypertension: Secondary | ICD-10-CM

## 2012-12-18 DIAGNOSIS — E119 Type 2 diabetes mellitus without complications: Secondary | ICD-10-CM

## 2012-12-18 DIAGNOSIS — E785 Hyperlipidemia, unspecified: Secondary | ICD-10-CM

## 2012-12-18 MED ORDER — FUROSEMIDE 20 MG PO TABS: 20.0000 mg | ORAL_TABLET | Freq: Every day | ORAL | Status: DC

## 2012-12-18 MED ORDER — METFORMIN HCL 1000 MG PO TABS: 1000.0000 mg | ORAL_TABLET | Freq: Two times a day (BID) | ORAL | Status: DC

## 2012-12-18 MED ORDER — SIMVASTATIN 20 MG PO TABS: 20.0000 mg | ORAL_TABLET | Freq: Every evening | ORAL | Status: DC

## 2012-12-18 MED ORDER — LISINOPRIL 20 MG PO TABS: 20.0000 mg | ORAL_TABLET | Freq: Every day | ORAL | Status: DC

## 2012-12-18 MED ORDER — GLIPIZIDE 10 MG PO TABS: 10.0000 mg | ORAL_TABLET | Freq: Every day | ORAL | Status: DC

## 2013-01-12 ENCOUNTER — Encounter: Payer: Self-pay | Admitting: Family Medicine

## 2013-01-12 ENCOUNTER — Ambulatory Visit (INDEPENDENT_AMBULATORY_CARE_PROVIDER_SITE_OTHER): Payer: Medicare Other | Admitting: Family Medicine

## 2013-01-12 VITALS — BP 140/90 | Temp 98.4°F | Wt 242.0 lb

## 2013-01-12 DIAGNOSIS — I10 Essential (primary) hypertension: Secondary | ICD-10-CM

## 2013-01-12 DIAGNOSIS — E119 Type 2 diabetes mellitus without complications: Secondary | ICD-10-CM

## 2013-01-12 MED ORDER — GLIPIZIDE 10 MG PO TABS: ORAL_TABLET | ORAL | Status: DC

## 2013-01-12 MED ORDER — LISINOPRIL 20 MG PO TABS: ORAL_TABLET | ORAL | Status: DC

## 2013-01-12 NOTE — Patient Instructions (Addendum)
Increase the lisinopril,,,,,,,, take 2 tabs daily in the morning  Increase the glipizide take one tab twice daily  Continue metformin one twice daily  Fasting blood sugar Monday Wednesday Friday  Return in 4 weeks for followup  Workout on your diet   Walk 30 minutes daily

## 2013-01-12 NOTE — Progress Notes (Signed)
  Subjective:    Patient ID: Billy Hansen, male    DOB: July 11, 1946, 67 y.o.   MRN: [id number]  HPI Billy Hansen is a 67 year old male who comes in today for followup of diabetes and hypertension  2 months ago his hemoglobin A1c went from normal to 10.9. His blood sugar is still elevated despite increasing his medication  His blood pressure is 140/90.   Review of Systems    review of systems otherwise negative no neuropathy Objective:   Physical Exam  Well-developed well-nourished male in no acute distress BP 140/90 foot exam normal pulses normal no neuropathy      Assessment & Plan:  Diabetes type 2 out of control increase Glucotrol 10 twice a day continue metformin 1000 mg twice a day followup in 2 months  Hypertension not at goal increase lisinopril to 40 mg daily

## 2013-02-16 ENCOUNTER — Ambulatory Visit (INDEPENDENT_AMBULATORY_CARE_PROVIDER_SITE_OTHER): Payer: Medicare Other | Admitting: Family Medicine

## 2013-02-16 ENCOUNTER — Encounter: Payer: Self-pay | Admitting: Family Medicine

## 2013-02-16 VITALS — BP 140/90 | Temp 98.5°F | Wt 240.0 lb

## 2013-02-16 DIAGNOSIS — I10 Essential (primary) hypertension: Secondary | ICD-10-CM

## 2013-02-16 DIAGNOSIS — E119 Type 2 diabetes mellitus without complications: Secondary | ICD-10-CM

## 2013-02-16 NOTE — Patient Instructions (Signed)
Continue the Glucotrol and metformin one of each twice daily  Fasting blood sugar daily in the morning  Were coronary diet and exercise  Return in 6 weeks with a record of all your blood sugar readings  If however during this process you see your blood sugar go up and not down then come back sooner and we will start insulin as we discussed

## 2013-02-16 NOTE — Progress Notes (Signed)
  Subjective:    Patient ID: Billy Hansen, male    DOB: 22-Mar-1946, 67 y.o.   MRN: [id number]  HPI [name] is a 67 year old male who comes in today for evaluation of hypertension diabetes  His blood pressure is 140/90. He takes Lasix 20 mg daily and lisinopril 40 mg daily.  His blood sugar on metformin 1000 mg twice a day and Glucotrol 10 mg twice a day is staying around 220. It was up to 260 but now instructed to 20 but will go any lower. In the past we started a discussion on insulin however he would like to try a diet and exercise to get his blood sugar back to normal   Review of Systems Review of systems negative    Objective:   Physical Exam Well-developed well-nourished male no acute distress vital signs stable he is afebrile BP 140/90 morning sugar 220       Assessment & Plan:  Hypertension approaching goal  Diabetes type 2 uncontrolled,,,,,,,,,, I suggested we started insulin he wants to try diet and exercise and followup in 6 weeks

## 2013-03-09 ENCOUNTER — Telehealth: Payer: Self-pay | Admitting: Family Medicine

## 2013-03-09 MED ORDER — GLUCOSE BLOOD VI STRP: ORAL_STRIP | Status: DC

## 2013-03-09 NOTE — Telephone Encounter (Signed)
Pt states he has the Accu check Compact Plus.

## 2013-03-09 NOTE — Telephone Encounter (Signed)
Left message on machine to find out which accu-chek glucometer he uses

## 2013-03-09 NOTE — Telephone Encounter (Signed)
Pt stopped by today to request that a new RX of accucheck testing strips be sent to [facility] on [address]. He states that he is completely out. Please assist.

## 2013-03-30 ENCOUNTER — Encounter: Payer: Self-pay | Admitting: Family Medicine

## 2013-03-30 ENCOUNTER — Ambulatory Visit (INDEPENDENT_AMBULATORY_CARE_PROVIDER_SITE_OTHER): Payer: Medicare Other | Admitting: Family Medicine

## 2013-03-30 VITALS — BP 130/90 | Temp 98.5°F | Wt 241.0 lb

## 2013-03-30 DIAGNOSIS — E119 Type 2 diabetes mellitus without complications: Secondary | ICD-10-CM

## 2013-03-30 NOTE — Patient Instructions (Signed)
Insulin Lantus,,,,,,,,,,,, 10 units at bedtime  Check a fasting blood sugar once a day in the morning  Return in one week for followup

## 2013-03-30 NOTE — Progress Notes (Signed)
  Subjective:    Patient ID: Billy Hansen, male    DOB: July 21, 1946, 67 y.o.   MRN: [id number]  HPI [name] is a 67 year old single male nonsmoker who comes in today for followup of diabetes  He's on glipizide 10 mg twice a day, metformin 1000 mg twice a day, blood sugars average 180-220. He's following a diet he's exercising on a regular basis.  He's on 7 trouble with his balance he feels wobbly at times. No vertigo or other neurologic symptoms. [place] comes and goes. He read about Zocor and he thinks it may be related to Zocor.   Review of Systems Review of systems otherwise negative    Objective:   Physical Exam  Well-developed well-nourished male in no acute distress review of his physical exam was unchanged      Assessment & Plan:  Diabetes type type II graduated diabetes type 1 started insulin see orders 15 minutes was spent with the patient reviewing the insulin and giving him his first shot  Imbalance etiology unknown stopped Zocor for 2 months if symptoms persist or get worse we will get a neurologic evaluation hypertension at goal continue current medication

## 2013-03-31 ENCOUNTER — Other Ambulatory Visit: Payer: Self-pay | Admitting: *Deleted

## 2013-03-31 DIAGNOSIS — E119 Type 2 diabetes mellitus without complications: Secondary | ICD-10-CM

## 2013-03-31 DIAGNOSIS — I10 Essential (primary) hypertension: Secondary | ICD-10-CM

## 2013-03-31 MED ORDER — LISINOPRIL 20 MG PO TABS: ORAL_TABLET | ORAL | Status: DC

## 2013-03-31 MED ORDER — GLIPIZIDE 10 MG PO TABS: ORAL_TABLET | ORAL | Status: DC

## 2013-04-06 ENCOUNTER — Telehealth: Payer: Self-pay | Admitting: Family Medicine

## 2013-04-06 NOTE — Telephone Encounter (Signed)
I called pt to reschedule his 1wk f/u w/ Dr Tawanna Cooler that was this Wed. He was to f/u on the new med: Lantus 10 units. He states that it is working somewhat, but does not feel it is enough. He did not want to make an appt w/ Padonda, so there is no appt for him at this time. He wanted you to know the info above, and see what he should do. Thanks.

## 2013-04-06 NOTE — Telephone Encounter (Signed)
Patient is aware 

## 2013-04-06 NOTE — Telephone Encounter (Signed)
Increase lantus by 6 units every week until fast bs in Am is around 120.

## 2013-04-08 ENCOUNTER — Ambulatory Visit: Payer: Medicare Other | Admitting: Family Medicine

## 2013-05-20 ENCOUNTER — Telehealth: Payer: Self-pay | Admitting: Family Medicine

## 2013-05-20 MED ORDER — INSULIN GLARGINE 100 UNIT/ML SOLOSTAR PEN: 10.0000 [IU] | PEN_INJECTOR | Freq: Every day | SUBCUTANEOUS | Status: DC

## 2013-05-20 NOTE — Telephone Encounter (Signed)
PT stopped by today to request a RX for Insulin Glargine (LANTUS SOLOSTAR) 100 UNIT/ML SOPN be sent to [facility] on [address]. Please assist.

## 2013-06-10 ENCOUNTER — Telehealth: Payer: Self-pay | Admitting: Family Medicine

## 2013-06-10 DIAGNOSIS — E119 Type 2 diabetes mellitus without complications: Secondary | ICD-10-CM

## 2013-06-10 NOTE — Telephone Encounter (Signed)
Referral request placed.

## 2013-06-10 NOTE — Telephone Encounter (Signed)
Pt states that he would like to see a specialist for his diabetes. Please advise.

## 2013-06-26 ENCOUNTER — Encounter: Payer: Self-pay | Admitting: Internal Medicine

## 2013-06-26 ENCOUNTER — Ambulatory Visit (INDEPENDENT_AMBULATORY_CARE_PROVIDER_SITE_OTHER): Payer: Medicare Other | Admitting: Internal Medicine

## 2013-06-26 VITALS — BP 124/68 | HR 64 | Temp 98.1°F | Resp 10 | Ht 70.5 in | Wt 244.0 lb

## 2013-06-26 DIAGNOSIS — E119 Type 2 diabetes mellitus without complications: Secondary | ICD-10-CM

## 2013-06-26 LAB — COMPREHENSIVE METABOLIC PANEL
ALT: 27 U/L (ref 0–53)
AST: 20 U/L (ref 0–37)
Alkaline Phosphatase: 66 U/L (ref 39–117)
CO2: 29 mEq/L (ref 19–32)
Creatinine, Ser: 1.2 mg/dL (ref 0.4–1.5)
Sodium: 142 mEq/L (ref 135–145)
Total Bilirubin: 0.9 mg/dL (ref 0.3–1.2)
Total Protein: 7.3 g/dL (ref 6.0–8.3)

## 2013-06-26 LAB — HEMOGLOBIN A1C: Hgb A1c MFr Bld: 7.4 % — ABNORMAL HIGH (ref 4.6–6.5)

## 2013-06-26 MED ORDER — SITAGLIPTIN PHOSPHATE 100 MG PO TABS: 100.0000 mg | ORAL_TABLET | Freq: Every day | ORAL | Status: DC

## 2013-06-26 NOTE — Patient Instructions (Addendum)
Please return in 1 month with your sugar log.  Stop Glipizide. Continue Metformin at 1000 mh 2x a day. Start Januvia 100 mg daily in am. When injecting insulin:  Inject in the abdomen  Rotate the injection sites around the belly button  Change needle for each injection  Keep needle in for 10 sec after last unit of insulin in   PATIENT INSTRUCTIONS FOR TYPE 2 DIABETES:  **Please join MyChart!** - see attached instructions about how to join   DIET AND EXERCISE Diet and exercise is an important part of diabetic treatment.  We recommended aerobic exercise in the form of brisk walking (working between 40-60% of maximal aerobic capacity, similar to brisk walking) for 150 minutes per week (such as 30 minutes five days per week) along with 3 times per week performing 'resistance' training (using various gauge rubber tubes with handles) 5-10 exercises involving the major muscle groups (upper body, lower body and core) performing 10-15 repetitions (or near fatigue) each exercise. Start at half the above goal but build slowly to reach the above goals. If limited by weight, joint pain, or disability, we recommend daily walking in a swimming pool with water up to waist to reduce pressure from joints while allow for adequate exercise.    BLOOD GLUCOSES Monitoring your blood glucoses is important for continued management of your diabetes. Please check your blood glucoses 2-4 times a day: fasting, before meals and at bedtime (you can rotate these measurements - e.g. one day check before the 3 meals, the next day check before 2 of the meals and before bedtime, etc.   HYPOGLYCEMIA (low blood sugar) Hypoglycemia is usually a reaction to not eating, exercising, or taking too much insulin/ other diabetes drugs.  Symptoms include tremors, sweating, hunger, confusion, headache, etc. Treat IMMEDIATELY with 15 grams of Carbs:   4 glucose tablets    cup regular juice/soda   2 tablespoons raisins   4  teaspoons sugar   1 tablespoon honey Recheck blood glucose in 15 mins and repeat above if still symptomatic/blood glucose <100. Please contact our office at [phone] if you have questions about how to next handle your insulin.  RECOMMENDATIONS TO REDUCE YOUR RISK OF DIABETIC COMPLICATIONS: * Take your prescribed MEDICATION(S). * Follow a DIABETIC diet: Complex carbs, fiber rich foods, heart healthy fish twice weekly, (monounsaturated and polyunsaturated) fats * AVOID saturated/trans fats, high fat foods, >2,300 mg salt per day. * EXERCISE at least 5 times a week for 30 minutes or preferably daily.  * DO NOT SMOKE OR DRINK more than 1 drink a day. * Check your FEET every day. Do not wear tightfitting shoes. Contact us if you develop an ulcer * See your EYE doctor once a year or more if needed * Get a FLU shot once a year * Get a PNEUMONIA vaccine once before and once after age 77 years  GOALS:  * Your Hemoglobin A1c of <7%  * fasting sugars need to be <130 * after meals sugars need to be <180 (2h after you start eating) * Your Systolic BP should be 140 or lower  * Your Diastolic BP should be 80 or lower  * Your HDL (Good Cholesterol) should be 40 or higher  * Your LDL (Bad Cholesterol) should be 100 or lower  * Your Triglycerides should be 150 or lower  * Your Urine microalbumin (kidney function) should be <30 * Your Body Mass Index should be 25 or lower   We will be glad  to help you achieve these goals. Our telephone number is: [phone].

## 2013-06-26 NOTE — Progress Notes (Signed)
Patient ID: Billy Hansen, male   DOB: 10-May-1946, 67 y.o.   MRN: [id number]  HPI: Billy Hansen is a 67 y.o.-year-old male, self-referred, for management of DM2, insulin-dependent, uncontrolled, without complications.   Patient has been diagnosed with diabetes in 2007; he has not been on insulin before. Last hemoglobin A1c was: Lab Results  Component Value Date   HGBA1C 10.9* 10/29/2012  Previous values: 7.0%, 6.8% 7.1%, 7.2%, 8.9%, 7.3% dating back to 2009.  Pt is on a regimen of: - Metformin 1000 mg po bid - Lantus 25 units units qhs >> added 10 units on 03/30/2013 - Glipizide 10 bid  Pt checks his sugars once a day and they are: - am: 119-181  Had lows after starting Lantus >> not as much now. Lowest sugar was 80's but not checking when he has lows. Noticed he had more lows after a 80-g carb meal; he has hypoglycemia awareness - unclear at what level. Highest sugar was 181 - this am.  Pt's meals are: - Breakfast: b'fast sandwich + coffee with splenda and cream - Lunch: baked chicken/fish + beans + diet drink - Dinner: same as lunch - Snacks: 3x a day: graham crackers, sometimes a sandwich He saw nutrition >> 80 g carb at each meal and 40 g at his snacks >> still doing this.   - no (or mild) CKD, last BUN/creatinine:  Lab Results  Component Value Date   BUN 16 10/29/2012   CREATININE 1.0 10/29/2012  On Lisinopril. The last ACR 2.9 (11/03/2012). - last set of lipids: Lab Results  Component Value Date   CHOL 205* 10/29/2012   HDL 34.80* 10/29/2012   LDLCALC 98 07/28/2009   LDLDIRECT 128.7 10/29/2012   TRIG 193.0* 10/29/2012   CHOLHDL 6 10/29/2012  He is off Zocor >> as it gave him incoordination. - last eye exam was in 02/2013. ?+ DR on L.  - no numbness and tingling in his feet.  I reviewed his chart and he also has a history of HTN, HL, GERD, h/o PE, ED.  Pt has FH of DM in cousins, brother.   ROS: Constitutional: no weight gain/loss, no fatigue, no subjective  hyperthermia/hypothermia Eyes: no blurry vision, no xerophthalmia ENT: no sore throat, no nodules palpated in throat, no dysphagia/odynophagia, no hoarseness; + decreased hearing, tinnitus Cardiovascular: no CP/SOB/palpitations/leg swelling Respiratory: no cough/SOB Gastrointestinal: no N/V/D/C Musculoskeletal: + both:  muscle/joint aches Skin: no rashes; + itching Neurological: no tremors/numbness/tingling/dizziness Psychiatric: no depression/anxiety  Past Medical History  Diagnosis Date  . Allergy   . Hyperlipidemia   . Hypertension    Past Surgical History  Procedure Laterality Date  . Colon surgery    . Knee surgery      right knee   History   Social History  . Marital Status: Divorced    Spouse Name: N/A    Children: yes   Occupational History  . maintenance   Social History Main Topics  . Smoking status: Former Smoker, quit 39 years ago  . Smokeless tobacco: Not on file  . Alcohol Use: No  . Drug Use: No  . Sexual Activity: Yes    Partners: Female   Social History Narrative   Regular exercise: walk   Caffeine use: 1 cup of coffee   Current Outpatient Prescriptions on File Prior to Visit  Medication Sig Dispense Refill  . furosemide (LASIX) 20 MG tablet Take 1 tablet (20 mg total) by mouth daily.  100 tablet  3  . glipiZIDE (  GLUCOTROL) 10 MG tablet One tablet twice daily  180 tablet  3  . glucose blood test strip accu chek compact plus, once daily, dx 250.00  100 each  12  . Insulin Pen Needle (BD PEN NEEDLE NANO U/F) 32G X 4 MM MISC 1 each by Does not apply route at bedtime.      Marland Kitchen lisinopril (PRINIVIL,ZESTRIL) 20 MG tablet 2 by mouth every morning  180 tablet  3  . metFORMIN (GLUCOPHAGE) 1000 MG tablet Take 1 tablet (1,000 mg total) by mouth 2 (two) times daily with a meal.  200 tablet  3  . simvastatin (ZOCOR) 20 MG tablet Take 1 tablet (20 mg total) by mouth every evening.  100 tablet  3   No current facility-administered medications on file prior to  visit.   Allergies  Allergen Reactions  . Naproxen Sodium    Family History  Problem Relation Age of Onset  . Cancer Mother    PE: BP 124/68  Pulse 64  Temp(Src) 98.1 F (36.7 C) (Oral)  Resp 10  Ht 5' 10.5" (1.791 m)  Wt 244 lb (110.678 kg)  BMI 34.5 kg/m2  SpO2 98% Wt Readings from Last 3 Encounters:  06/26/13 244 lb (110.678 kg)  03/30/13 241 lb (109.317 kg)  02/16/13 240 lb (108.863 kg)   Constitutional: overweight, in NAD Eyes: PERRLA, EOMI, no exophthalmos ENT: moist mucous membranes, no thyromegaly, no cervical lymphadenopathy Cardiovascular: RRR, No MRG Respiratory: CTA B Gastrointestinal: abdomen soft, NT, ND, BS+ Musculoskeletal: no deformities, strength intact in all 4 Skin: moist, warm, no rashes Neurological: no tremor with outstretched hands, DTR normal in all 4  ASSESSMENT: 1. DM2, non-insulin-dependent, uncontrolled, without complications  PLAN:  1. Patient with long-standing, recently more uncontrolled diabetes, on oral antidiabetic regimen and with recently added Lantus, which is not helping, he feels. He onlt checks am sugars so there are not enough data points to evaluate his control properly. Also, last HbA1C was checked 8 mo ago. He appears to have lows before and sometimes after lunch and also has high am sugars, which can be a reaction to predawn low CBGs.  - We discussed about options for treatment, and I suggested to:  Stop Glipizide. Continue Metformin at 1000 mg 2x a day. Start Januvia 100 mg daily in am. When injecting insulin:  Inject in the abdomen  Rotate the injection sites around the belly button  Change needle for each injection  Keep needle in for 10 sec after last unit of insulin in - Strongly advised to start checking sugars at different times of the day - check 2 times a day, rotating checks - given sugar log and advised how to fill it and to bring it at next appt  - given foot care handout and explained the principles  -  given instructions for hypoglycemia management "15-15 rule"  - advised for yearly eye exams - check HbA1C and CMP today - Return to clinic in one month with sugar log   Office Visit on 06/26/2013  Component Date Value Range Status  . Hemoglobin A1C 06/26/2013 7.4* 4.6 - 6.5 % Final   Glycemic Control Guidelines for People with Diabetes:Non Diabetic:  <6%Goal of Therapy: <7%Additional Action Suggested:  >8%   . Sodium 06/26/2013 142  135 - 145 mEq/L Final  . Potassium 06/26/2013 4.1  3.5 - 5.1 mEq/L Final  . Chloride 06/26/2013 105  96 - 112 mEq/L Final  . CO2 06/26/2013 29  19 - 32 mEq/L Final  .  Glucose, Bld 06/26/2013 112* 70 - 99 mg/dL Final  . BUN 19/14/7829 19  6 - 23 mg/dL Final  . Creatinine, Ser 06/26/2013 1.2  0.4 - 1.5 mg/dL Final  . Total Bilirubin 06/26/2013 0.9  0.3 - 1.2 mg/dL Final  . Alkaline Phosphatase 06/26/2013 66  39 - 117 U/L Final  . AST 06/26/2013 20  0 - 37 U/L Final  . ALT 06/26/2013 27  0 - 53 U/L Final  . Total Protein 06/26/2013 7.3  6.0 - 8.3 g/dL Final  . Albumin 56/21/3086 4.4  3.5 - 5.2 g/dL Final  . Calcium 57/84/6962 9.5  8.4 - 10.5 mg/dL Final  . GFR 95/28/4132 80.30  >60.00 mL/min Final   Great improvement in hemoglobin A1c. Creatinine slightly higher, at 1.2. Continue the above medication changes.

## 2013-07-27 ENCOUNTER — Ambulatory Visit (INDEPENDENT_AMBULATORY_CARE_PROVIDER_SITE_OTHER): Payer: Medicare Other | Admitting: Internal Medicine

## 2013-07-27 ENCOUNTER — Encounter: Payer: Self-pay | Admitting: Internal Medicine

## 2013-07-27 ENCOUNTER — Telehealth: Payer: Self-pay | Admitting: Internal Medicine

## 2013-07-27 VITALS — BP 132/80 | HR 101 | Temp 99.0°F | Resp 10 | Wt 240.6 lb

## 2013-07-27 DIAGNOSIS — E119 Type 2 diabetes mellitus without complications: Secondary | ICD-10-CM

## 2013-07-27 DIAGNOSIS — Z23 Encounter for immunization: Secondary | ICD-10-CM

## 2013-07-27 MED ORDER — CANAGLIFLOZIN 100 MG PO TABS: 100.0000 mg | ORAL_TABLET | Freq: Every day | ORAL | Status: DC

## 2013-07-27 NOTE — Telephone Encounter (Signed)
Pt called to be sure he was clear on the directions that Dr Elvera Lennox gave him. I went over the pt's AVS with him: advised him per Dr Elvera Lennox to, Stop Glipizide. Continue Metformin at 1000 mg 2x a day. Start Invokana 100 mg daily in am. Hold off insulin for now. Pt understood.

## 2013-07-27 NOTE — Patient Instructions (Signed)
Please return in 1 month with your sugar log.   Stop Glipizide. Continue Metformin at 1000 mg 2x a day. Start Invokana 100 mg daily in am. Hold off insulin for now  Please let me know in 2 weeks how your sugars are doing. I will then call in some more Invokana.

## 2013-07-27 NOTE — Progress Notes (Signed)
Patient ID: Billy Hansen, male   DOB: 06/04/1946, 67 y.o.   MRN: [id number]  HPI: Billy Hansen is a 67 y.o.-year-old male, self-referred, for management of DM2, dx 2007, insulin-dependent, uncontrolled, without complications.   Last hemoglobin A1c was: Lab Results  Component Value Date   HGBA1C 7.4* 06/26/2013   HGBA1C 10.9* 10/29/2012   HGBA1C 7.0* 10/28/2009   Pt is on a regimen of: - Metformin 1000 mg po bid We stopped Glipizide 10 bid >> started 07/07/2013 Glipizide 10 mg in am >> sugars improved. He was on Lantus 25 units units qhs, but ran out of insulin 07/21/2013 >> sugars did not change much. We tried Januvia >> HAs, fatigue >> stopped 07/06/2013. Now only on Metformin and Glipizide 10 mg daily.  Pt checks his sugars once a day and they are: - am: 119-181 >> 150-192 - dinnertime 69-183 - 2h after dinner: 114, 150 - bedtime: 109, and a 309 after Church dinner (spaghetti) Has lows: 62; he has hypoglycemia awareness 70. Highest sugar was  309. He saw nutrition >> 80 g carb at each meal and 40 g at his snacks.  - has CKD, last BUN/creatinine:  Lab Results  Component Value Date   BUN 19 06/26/2013   CREATININE 1.2 06/26/2013  On Lisinopril. The last ACR 2.9 (11/03/2012). - last set of lipids: Lab Results  Component Value Date   CHOL 205* 10/29/2012   HDL 34.80* 10/29/2012   LDLCALC 98 07/28/2009   LDLDIRECT 128.7 10/29/2012   TRIG 193.0* 10/29/2012   CHOLHDL 6 10/29/2012  He is off Zocor, as it gave him incoordination. - last eye exam was in 02/2013. ?+ DR on L.  - no numbness and tingling in his feet.  I reviewed his chart and he also has a history of HTN, HL, GERD, h/o PE, ED.  I reviewed pt's medications, allergies, PMH, social hx, family hx and no changes required, except as mentioned above.  ROS: Constitutional: + weight loss - intentional, no fatigue, no subjective hyperthermia/hypothermia Eyes: no blurry vision, no xerophthalmia ENT: no sore throat, no  nodules palpated in throat, no dysphagia/odynophagia, no hoarseness Cardiovascular: no CP/SOB/palpitations/leg swelling Respiratory: no cough/SOB Gastrointestinal: no N/V/D/C Musculoskeletal: no muscle/joint aches Skin: no rashes  PE: BP 132/80  Pulse 101  Temp(Src) 99 F (37.2 C) (Oral)  Resp 10  Wt 240 lb 9.6 oz (109.135 kg)  SpO2 96% Wt Readings from Last 3 Encounters:  07/27/13 240 lb 9.6 oz (109.135 kg)  06/26/13 244 lb (110.678 kg)  03/30/13 241 lb (109.317 kg)   Constitutional: overweight, in NAD Eyes: PERRLA, EOMI, no exophthalmos ENT: moist mucous membranes, no thyromegaly, no cervical lymphadenopathy Cardiovascular: RRR, No MRG Respiratory: CTA B Gastrointestinal: abdomen soft, NT, ND, BS+ Musculoskeletal: no deformities, strength intact in all 4 Skin: moist, warm, no rashes Neurological: no tremor with outstretched hands, DTR normal in all 4  ASSESSMENT: 1. DM2, non-insulin-dependent, uncontrolled, without complications  PLAN:  1. Patient with long-standing diabetes, with improved control in last few months, but still with variability in his sugars. He has a lot of stress in his family and feels that this has an impact on his sugars too. His CBGs are not much changed after stopping insulin >> will hold off restarting for now. We discussed about starting Invokana with Metformin. We discussed about SEs of Invokana, which are: dizziness (advised to be careful when stands from sitting position), decreased BP - usually not < normal (BP today is not low), and  fungal UTIs (advised to let me know if develops one). I advised him to stay off the furosemide for a few days, but to continue to check his blood pressure at home (he does have a blood pressure cuff), and restart if his systolic blood pressure increases over 140. - I suggested to:  Stop Glipizide. Continue Metformin at 1000 mg 2x a day. Start Invokana 100 mg daily in am. Hold off insulin for now - continue to start  checking sugars at different times of the day - check 2 times a day, rotating checks - given more sugar logs - he is up to date with his eye exams, has an appointment coming up soon - given flu vaccine today - given Invokana samples for 15 days, advised him to let me know how his sugars are doing and how he tolerates it after 2 weeks and then I will call in some more to his pharmacy - Return to clinic in one month with sugar log

## 2013-08-07 ENCOUNTER — Other Ambulatory Visit: Payer: Self-pay | Admitting: *Deleted

## 2013-08-07 MED ORDER — CANAGLIFLOZIN 100 MG PO TABS: 100.0000 mg | ORAL_TABLET | Freq: Every day | ORAL | Status: DC

## 2013-08-10 ENCOUNTER — Other Ambulatory Visit: Payer: Self-pay | Admitting: *Deleted

## 2013-08-10 MED ORDER — CANAGLIFLOZIN 100 MG PO TABS: 100.0000 mg | ORAL_TABLET | Freq: Every day | ORAL | Status: DC

## 2013-08-31 ENCOUNTER — Encounter: Payer: Self-pay | Admitting: Internal Medicine

## 2013-08-31 ENCOUNTER — Ambulatory Visit (INDEPENDENT_AMBULATORY_CARE_PROVIDER_SITE_OTHER): Payer: Medicare Other | Admitting: Internal Medicine

## 2013-08-31 VITALS — BP 118/74 | HR 94 | Temp 98.1°F | Resp 10 | Wt 233.8 lb

## 2013-08-31 DIAGNOSIS — E119 Type 2 diabetes mellitus without complications: Secondary | ICD-10-CM

## 2013-08-31 MED ORDER — CANAGLIFLOZIN 300 MG PO TABS: 300.0000 mg | ORAL_TABLET | Freq: Every day | ORAL | Status: DC

## 2013-08-31 NOTE — Patient Instructions (Signed)
Continue Invokana but increase to 300 mg a day. Continue Metformin 1000 mg 2x a day.  Please return in 1 month with your sugar log.

## 2013-08-31 NOTE — Progress Notes (Signed)
Patient ID: Billy Hansen, male   DOB: 11/11/1945, 67 y.o.   MRN: [id number]  HPI: Billy Hansen is a 67 y.o.-year-old male, self-referred, for management of DM2, dx 2007, insulin-dependent, uncontrolled, without complications. Last visit 1 mo ago.  Last hemoglobin A1c was: Lab Results  Component Value Date   HGBA1C 7.4* 06/26/2013   HGBA1C 10.9* 10/29/2012   HGBA1C 7.0* 10/28/2009   Pt is on a regimen of: - Metformin 1000 mg po bid - Invokana 100 mg started at last visit >> tolerates it well He was on Lantus 25 units units qhs, but ran out of insulin 07/21/2013 >> sugars did not change much. We stopped Glipizide 10 bid.  We tried Januvia >> HAs, fatigue >> stopped 07/06/2013. Now only on Metformin and Glipizide 10 mg daily.  Pt checks his sugars once a day and they are: - am: 119-181 >> 150-192 >> 136-182  - 2h after b'fast:  N/c - lunch: 120-154 - 2h after lunch: 123, 139 - dinnertime 69-183 >> 120-145 (one 248) - 2h after dinner: 114, 150 >> 107-233 - bedtime: 109, and a 309 after Church dinner (spaghetti) >> 225, 230, 249 lately No more lows; he has hypoglycemia awareness 70. Highest sugar was 249.  He saw nutrition >> 80 g carb at each meal and 40 g at his snacks.  - has CKD, last BUN/creatinine:  Lab Results  Component Value Date   BUN 19 06/26/2013   CREATININE 1.2 06/26/2013  On Lisinopril. The last ACR 2.9 (11/03/2012). - last set of lipids: Lab Results  Component Value Date   CHOL 205* 10/29/2012   HDL 34.80* 10/29/2012   LDLCALC 98 07/28/2009   LDLDIRECT 128.7 10/29/2012   TRIG 193.0* 10/29/2012   CHOLHDL 6 10/29/2012  He is off Zocor, as it gave him incoordination. - last eye exam was in 02/2013. ?+ DR on L.  - no numbness and tingling in his feet.  I reviewed his chart and he also has a history of HTN, HL, GERD, h/o PE, ED.  I reviewed pt's medications, allergies, PMH, social hx, family hx and no changes required, except as mentioned  above.  ROS: Constitutional: + weight loss, no fatigue, + hot flusheshyperthermia/hypothermia Eyes: no blurry vision, no xerophthalmia ENT: no sore throat, no nodules palpated in throat, no dysphagia/odynophagia, no hoarseness Cardiovascular: no CP/SOB/palpitations/leg swelling Respiratory: no cough/SOB Gastrointestinal: no N/V/D/C Musculoskeletal: no muscle/joint aches Skin: no rashes  PE: BP 118/74  Pulse 94  Temp(Src) 98.1 F (36.7 C) (Oral)  Resp 10  Wt 233 lb 12.8 oz (106.051 kg)  SpO2 97% Wt Readings from Last 3 Encounters:  08/31/13 233 lb 12.8 oz (106.051 kg)  07/27/13 240 lb 9.6 oz (109.135 kg)  06/26/13 244 lb (110.678 kg)   Constitutional: overweight, in NAD Eyes: PERRLA, EOMI, no exophthalmos ENT: moist mucous membranes, no thyromegaly, no cervical lymphadenopathy Cardiovascular: RRR, No MRG Respiratory: CTA B Gastrointestinal: abdomen soft, NT, ND, BS+ Musculoskeletal: no deformities, strength intact in all 4 Skin: moist, warm, no rashes  ASSESSMENT: 1. DM2, non-insulin-dependent, uncontrolled, without complications  PLAN:  1. Patient with long-standing diabetes, with improved control in last few months, but still with variability in his sugars, mostly due to dietary indiscretions in the evening. His CBGs are not much changed after stopping insulin >> will hold off restarting for now.  - I suggested to:  Continue Metformin at 1000 mg 2x a day. Start Invokana 300 mg daily in am. Hold off the am Lasix  dose - continue to start checking sugars at different times of the day - check 2 times a day, rotating checks - given more sugar logs - he is up to date with his eye exams - given flu vaccine today - given Invokana 300 samples for 15 days, I also called in 1 mo supply to his pharmacy - Return to clinic in one month with sugar log - check A1c then

## 2013-10-05 ENCOUNTER — Ambulatory Visit: Payer: Medicare Other | Admitting: Internal Medicine

## 2013-10-08 ENCOUNTER — Ambulatory Visit (INDEPENDENT_AMBULATORY_CARE_PROVIDER_SITE_OTHER): Payer: Medicare Other | Admitting: Internal Medicine

## 2013-10-08 ENCOUNTER — Encounter: Payer: Self-pay | Admitting: Internal Medicine

## 2013-10-08 VITALS — BP 116/68 | HR 78 | Temp 98.0°F | Resp 12 | Wt 227.8 lb

## 2013-10-08 DIAGNOSIS — E119 Type 2 diabetes mellitus without complications: Secondary | ICD-10-CM

## 2013-10-08 LAB — HEMOGLOBIN A1C: Hgb A1c MFr Bld: 7.6 % — ABNORMAL HIGH (ref 4.6–6.5)

## 2013-10-08 MED ORDER — CANAGLIFLOZIN 300 MG PO TABS: 300.0000 mg | ORAL_TABLET | Freq: Every day | ORAL | Status: DC

## 2013-10-08 NOTE — Patient Instructions (Addendum)
Continue current diabetic regimen. Keep up the great work! Please return in 3 month with your sugar log.  Please stop at the lab.

## 2013-10-08 NOTE — Progress Notes (Signed)
Patient ID: Billy Hansen, male   DOB: [birth year], 68 y.o.   MRN: [id number]  HPI: Billy Hansen is a 68 y.o.-year-old male, self-referred, for management of DM2, dx 2007, insulin-dependent, uncontrolled, without complications. Last visit 1 mo ago.  He was fired on Christmas day >> stressed lately.  Last hemoglobin A1c was: Lab Results  Component Value Date   HGBA1C 7.4* 06/26/2013   HGBA1C 10.9* 10/29/2012   HGBA1C 7.0* 10/28/2009   Pt is on a regimen of: - Metformin 1000 mg po bid - Invokana 300 mg increased at last visit >> tolerates it well He was on Lantus 25 units units qhs, but ran out of insulin 07/21/2013 >> sugars did not change much. We stopped Glipizide 10 bid.  We tried Januvia >> HAs, fatigue >> stopped 07/06/2013. Now only on Metformin and Glipizide 10 mg daily.  Pt checks his sugars once a day and they are (forgot log): - am: 119-181 >> 150-192 >> 136-182 >> 140s - 2h after b'fast:  N/c  - lunch: 120-154 >> 112-146 - 2h after lunch: 123, 139 >> 140s - dinnertime 69-183 >> 120-145 (one 248) >> 115-130 - 2h after dinner: 114, 150 >> 107-233 >> n/c - bedtime: 109, and a 309 after Church dinner (spaghetti) >> 225, 230, 249 >> 140s No more lows; he has hypoglycemia awareness 70.   He lost 13 lbs in last 2 months.  He saw nutrition >> 80 g carb at each meal and 40 g at his snacks.  - has CKD, last BUN/creatinine:  Lab Results  Component Value Date   BUN 19 06/26/2013   CREATININE 1.2 06/26/2013  On Lisinopril. The last ACR 2.9 (11/03/2012). - last set of lipids: Lab Results  Component Value Date   CHOL 205* 10/29/2012   HDL 34.80* 10/29/2012   LDLCALC 98 07/28/2009   LDLDIRECT 128.7 10/29/2012   TRIG 193.0* 10/29/2012   CHOLHDL 6 10/29/2012  He is off Zocor, as it gave him incoordination. - last eye exam was in 02/2013. ?+ DR on L.  - no numbness and tingling in his feet.  I reviewed his chart and he also has a history of HTN, HL, GERD, h/o PE, ED.  I  reviewed pt's medications, allergies, PMH, social hx, family hx and no changes required, except as mentioned above.  ROS: Constitutional: + weight loss, no fatigue, + hot flusheshyperthermia/hypothermia Eyes: no blurry vision, no xerophthalmia ENT: no sore throat, no nodules palpated in throat, no dysphagia/odynophagia, no hoarseness Cardiovascular: no CP/SOB/palpitations/leg swelling Respiratory: no cough/SOB Gastrointestinal: no N/V/D/C Musculoskeletal: no muscle/joint aches Skin: no rashes  PE: BP 116/68  Pulse 78  Temp(Src) 98 F (36.7 C) (Oral)  Resp 12  Wt 227 lb 12.8 oz (103.329 kg)  SpO2 97% Wt Readings from Last 3 Encounters:  10/08/13 227 lb 12.8 oz (103.329 kg)  08/31/13 233 lb 12.8 oz (106.051 kg)  07/27/13 240 lb 9.6 oz (109.135 kg)   Constitutional: overweight, in NAD Eyes: PERRLA, EOMI, no exophthalmos ENT: moist mucous membranes, no thyromegaly, no cervical lymphadenopathy Cardiovascular: RRR, No MRG Respiratory: CTA B Gastrointestinal: abdomen soft, NT, ND, BS+ Musculoskeletal: no deformities, strength intact in all 4 Skin: moist, warm, no rashes  ASSESSMENT: 1. DM2, non-insulin-dependent, uncontrolled, without complications  PLAN:  1. Patient with long-standing diabetes, with much improved control in last months. He also started to lose weight, which is great. Tolerates Invokana well. - I suggested to:  Patient Instructions  Continue current diabetic regimen. Keep up  the great work! Please return in 3 month with your sugar log.  Please stop at the lab.  - continue to start checking sugars at different times of the day - check 2 times a day, rotating checks - given more sugar logs - he is up to date with his eye exams - given flu vaccine this season - sent Rx Invokana 300 to [facility] - Return to clinic in 3 month with sugar log  Office Visit on 10/08/2013  Component Date Value Range Status  . Hemoglobin A1C 10/08/2013 7.6* 4.6 - 6.5 % Final    Glycemic Control Guidelines for People with Diabetes:Non Diabetic:  <6%Goal of Therapy: <7%Additional Action Suggested:  >8%    I am again surprised that HbA1c not lower... Continue current regimen.

## 2013-10-27 ENCOUNTER — Other Ambulatory Visit: Payer: Self-pay | Admitting: *Deleted

## 2013-10-27 MED ORDER — GLUCOSE BLOOD VI STRP: ORAL_STRIP | Status: DC

## 2013-12-29 ENCOUNTER — Other Ambulatory Visit: Payer: Self-pay | Admitting: Family Medicine

## 2013-12-29 DIAGNOSIS — I10 Essential (primary) hypertension: Secondary | ICD-10-CM

## 2013-12-29 DIAGNOSIS — E785 Hyperlipidemia, unspecified: Secondary | ICD-10-CM

## 2013-12-29 DIAGNOSIS — E119 Type 2 diabetes mellitus without complications: Secondary | ICD-10-CM

## 2013-12-29 NOTE — Telephone Encounter (Signed)
[facility] - [place], [place] - [address] is requesting re-fills on the following, 90 day supply:  furosemide (LASIX) 20 MG tablet simvastatin (ZOCOR) 20 MG tablet metFORMIN (GLUCOPHAGE) 1000 MG tablet

## 2013-12-30 MED ORDER — METFORMIN HCL 1000 MG PO TABS: 1000.0000 mg | ORAL_TABLET | Freq: Two times a day (BID) | ORAL | Status: DC

## 2013-12-30 MED ORDER — SIMVASTATIN 20 MG PO TABS: 20.0000 mg | ORAL_TABLET | Freq: Every evening | ORAL | Status: DC

## 2013-12-30 MED ORDER — FUROSEMIDE 20 MG PO TABS: 20.0000 mg | ORAL_TABLET | Freq: Every day | ORAL | Status: DC

## 2014-01-01 NOTE — Telephone Encounter (Signed)
Scripts were sent in on 12/30/13.

## 2014-01-05 ENCOUNTER — Encounter: Payer: Self-pay | Admitting: Internal Medicine

## 2014-01-05 ENCOUNTER — Ambulatory Visit (INDEPENDENT_AMBULATORY_CARE_PROVIDER_SITE_OTHER): Payer: Medicare Other | Admitting: Internal Medicine

## 2014-01-05 ENCOUNTER — Other Ambulatory Visit: Payer: Self-pay | Admitting: *Deleted

## 2014-01-05 VITALS — BP 118/64 | HR 79 | Temp 97.7°F | Resp 12 | Wt 224.0 lb

## 2014-01-05 DIAGNOSIS — E119 Type 2 diabetes mellitus without complications: Secondary | ICD-10-CM

## 2014-01-05 MED ORDER — GLUCOSE BLOOD VI STRP: ORAL_STRIP | Status: DC

## 2014-01-05 NOTE — Patient Instructions (Signed)
Please continue the current regimen. Come to the lab tomorrow. Please come back for a follow-up appointment in 3 months with your sugar log.
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
## 2014-01-06 ENCOUNTER — Telehealth: Payer: Self-pay | Admitting: Family Medicine

## 2014-01-06 ENCOUNTER — Other Ambulatory Visit (INDEPENDENT_AMBULATORY_CARE_PROVIDER_SITE_OTHER): Payer: Medicare Other

## 2014-01-06 DIAGNOSIS — I10 Essential (primary) hypertension: Secondary | ICD-10-CM

## 2014-01-06 DIAGNOSIS — E119 Type 2 diabetes mellitus without complications: Secondary | ICD-10-CM

## 2014-01-06 DIAGNOSIS — E785 Hyperlipidemia, unspecified: Secondary | ICD-10-CM

## 2014-01-06 LAB — BASIC METABOLIC PANEL
BUN: 23 mg/dL (ref 6–23)
CHLORIDE: 105 meq/L (ref 96–112)
CO2: 29 mEq/L (ref 19–32)
Calcium: 9.4 mg/dL (ref 8.4–10.5)
Creatinine, Ser: 1 mg/dL (ref 0.4–1.5)
GFR: 92.87 mL/min (ref >60.00)
Glucose, Bld: 133 mg/dL — ABNORMAL HIGH (ref 70–99)
Potassium: 4.3 mEq/L (ref 3.5–5.1)
SODIUM: 141 meq/L (ref 135–145)

## 2014-01-06 LAB — HEMOGLOBIN A1C: HEMOGLOBIN A1C: 6.8 % — AB (ref 4.6–6.5)

## 2014-01-06 MED ORDER — FUROSEMIDE 20 MG PO TABS: 20.0000 mg | ORAL_TABLET | Freq: Every day | ORAL | Status: DC

## 2014-01-06 MED ORDER — METFORMIN HCL 1000 MG PO TABS: 1000.0000 mg | ORAL_TABLET | Freq: Two times a day (BID) | ORAL | Status: DC

## 2014-01-06 MED ORDER — SIMVASTATIN 20 MG PO TABS: 20.0000 mg | ORAL_TABLET | Freq: Every evening | ORAL | Status: DC

## 2014-01-06 NOTE — Telephone Encounter (Signed)
rx sent to [facility]

## 2014-01-06 NOTE — Telephone Encounter (Signed)
Relevant patient education mailed to patient.  

## 2014-01-06 NOTE — Telephone Encounter (Signed)
Pt needs refills on metformin  1000 mg #180,simvastatin 20 mg #90 and furosemide 20 mg #90 each rx with refills sent to [facility]

## 2014-01-15 ENCOUNTER — Telehealth: Payer: Self-pay

## 2014-01-15 NOTE — Telephone Encounter (Signed)
Relevant patient education mailed to patient.  

## 2014-02-04 ENCOUNTER — Emergency Department (HOSPITAL_COMMUNITY)
Admission: EM | Admit: 2014-02-04 | Discharge: 2014-02-04 | Disposition: A | Discharge status: Home / Self Care | Payer: Medicare Other | Attending: Emergency Medicine | Admitting: Emergency Medicine

## 2014-02-04 ENCOUNTER — Emergency Department (HOSPITAL_COMMUNITY): Payer: Medicare Other

## 2014-02-04 ENCOUNTER — Encounter (HOSPITAL_COMMUNITY): Payer: Self-pay | Admitting: Emergency Medicine

## 2014-02-04 DIAGNOSIS — IMO0002 Reserved for concepts with insufficient information to code with codable children: Secondary | ICD-10-CM | POA: Insufficient documentation

## 2014-02-04 DIAGNOSIS — Y9389 Activity, other specified: Secondary | ICD-10-CM | POA: Insufficient documentation

## 2014-02-04 DIAGNOSIS — Y92410 Unspecified street and highway as the place of occurrence of the external cause: Secondary | ICD-10-CM | POA: Insufficient documentation

## 2014-02-04 DIAGNOSIS — E119 Type 2 diabetes mellitus without complications: Secondary | ICD-10-CM | POA: Insufficient documentation

## 2014-02-04 DIAGNOSIS — E785 Hyperlipidemia, unspecified: Secondary | ICD-10-CM | POA: Insufficient documentation

## 2014-02-04 DIAGNOSIS — S161XXA Strain of muscle, fascia and tendon at neck level, initial encounter: Secondary | ICD-10-CM

## 2014-02-04 DIAGNOSIS — I10 Essential (primary) hypertension: Secondary | ICD-10-CM | POA: Insufficient documentation

## 2014-02-04 DIAGNOSIS — R509 Fever, unspecified: Secondary | ICD-10-CM | POA: Insufficient documentation

## 2014-02-04 DIAGNOSIS — S139XXA Sprain of joints and ligaments of unspecified parts of neck, initial encounter: Secondary | ICD-10-CM | POA: Insufficient documentation

## 2014-02-04 DIAGNOSIS — Z794 Long term (current) use of insulin: Secondary | ICD-10-CM | POA: Insufficient documentation

## 2014-02-04 DIAGNOSIS — S0990XA Unspecified injury of head, initial encounter: Secondary | ICD-10-CM | POA: Insufficient documentation

## 2014-02-04 DIAGNOSIS — Z79899 Other long term (current) drug therapy: Secondary | ICD-10-CM | POA: Insufficient documentation

## 2014-02-04 HISTORY — DX: Type 2 diabetes mellitus without complications: E11.9

## 2014-02-04 LAB — CBG MONITORING, ED: Glucose-Capillary: 109 mg/dL — ABNORMAL HIGH (ref 70–99)

## 2014-02-04 MED ORDER — HYDROCODONE-ACETAMINOPHEN 5-325 MG PO TABS: 1.0000 | ORAL_TABLET | Freq: Four times a day (QID) | ORAL | Status: DC | PRN

## 2014-02-04 MED ORDER — TRAMADOL HCL 50 MG PO TABS
50.0000 mg | ORAL_TABLET | Freq: Four times a day (QID) | ORAL | Status: DC | PRN
Start: 2014-02-04 — End: 2014-08-16
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
## 2014-02-04 NOTE — ED Notes (Signed)
Pt returned from CT. Family at bedside

## 2014-04-02 ENCOUNTER — Telehealth: Payer: Self-pay | Admitting: *Deleted

## 2014-04-02 DIAGNOSIS — E785 Hyperlipidemia, unspecified: Secondary | ICD-10-CM

## 2014-04-02 DIAGNOSIS — E109 Type 1 diabetes mellitus without complications: Secondary | ICD-10-CM

## 2014-04-02 NOTE — Telephone Encounter (Signed)
Left message on machine for patient to schedule a lab appointment Lipid ordered Diabatic bundle

## 2014-04-09 ENCOUNTER — Ambulatory Visit (INDEPENDENT_AMBULATORY_CARE_PROVIDER_SITE_OTHER): Payer: Medicare Other | Admitting: Internal Medicine

## 2014-04-09 ENCOUNTER — Telehealth: Payer: Self-pay | Admitting: Family Medicine

## 2014-04-09 ENCOUNTER — Encounter: Payer: Self-pay | Admitting: Internal Medicine

## 2014-04-09 VITALS — BP 118/68 | HR 89 | Temp 98.0°F | Resp 12 | Wt 230.0 lb

## 2014-04-09 DIAGNOSIS — I10 Essential (primary) hypertension: Secondary | ICD-10-CM

## 2014-04-09 DIAGNOSIS — E1165 Type 2 diabetes mellitus with hyperglycemia: Principal | ICD-10-CM

## 2014-04-09 DIAGNOSIS — E1129 Type 2 diabetes mellitus with other diabetic kidney complication: Secondary | ICD-10-CM

## 2014-04-09 LAB — HEMOGLOBIN A1C: HEMOGLOBIN A1C: 7.3 % — AB (ref 4.6–6.5)

## 2014-04-09 MED ORDER — LISINOPRIL 20 MG PO TABS: ORAL_TABLET | ORAL | Status: DC

## 2014-04-09 NOTE — Patient Instructions (Signed)
-   Continue Metformin 1000 mg 2x daily - Continue Invokana 300 mg in am Please return in 3 months with your sugar log.  Pleas stop at the lab. 
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
## 2014-04-09 NOTE — Telephone Encounter (Signed)
[facility] - [place], [place] - [address] is requesting 90 day re-fill on lisinopril (PRINIVIL,ZESTRIL) 20 MG tablet

## 2014-04-16 ENCOUNTER — Telehealth: Payer: Self-pay | Admitting: Internal Medicine

## 2014-04-16 ENCOUNTER — Other Ambulatory Visit: Payer: Self-pay | Admitting: *Deleted

## 2014-04-16 MED ORDER — CANAGLIFLOZIN 300 MG PO TABS: 300.0000 mg | ORAL_TABLET | Freq: Every day | ORAL | Status: DC

## 2014-04-16 NOTE — Telephone Encounter (Signed)
Patient would like his Invokana refilled  He would only like 30 day supply at a time not 90  [facility] [address]   Thank you

## 2014-06-01 ENCOUNTER — Telehealth: Payer: Self-pay | Admitting: Family Medicine

## 2014-06-01 DIAGNOSIS — E119 Type 2 diabetes mellitus without complications: Secondary | ICD-10-CM

## 2014-06-01 DIAGNOSIS — I10 Essential (primary) hypertension: Secondary | ICD-10-CM

## 2014-06-01 DIAGNOSIS — E785 Hyperlipidemia, unspecified: Secondary | ICD-10-CM

## 2014-06-01 MED ORDER — FUROSEMIDE 20 MG PO TABS: 20.0000 mg | ORAL_TABLET | Freq: Every day | ORAL | Status: DC

## 2014-06-01 MED ORDER — SIMVASTATIN 20 MG PO TABS: 20.0000 mg | ORAL_TABLET | Freq: Every evening | ORAL | Status: DC

## 2014-06-01 MED ORDER — METFORMIN HCL 1000 MG PO TABS: 1000.0000 mg | ORAL_TABLET | Freq: Two times a day (BID) | ORAL | Status: DC

## 2014-06-01 NOTE — Telephone Encounter (Signed)
Refills sent.  Patient will need an office visit for more refills.

## 2014-06-01 NOTE — Telephone Encounter (Signed)
[facility] - [place], [place] - [address] is requesting re-fills on the following: simvastatin (ZOCOR) 20 MG tablet metFORMIN (GLUCOPHAGE) 1000 MG tablet furosemide (LASIX) 20 MG tablet

## 2014-07-12 ENCOUNTER — Encounter: Payer: Self-pay | Admitting: Internal Medicine

## 2014-07-12 ENCOUNTER — Ambulatory Visit (INDEPENDENT_AMBULATORY_CARE_PROVIDER_SITE_OTHER): Payer: Medicare Other | Admitting: Internal Medicine

## 2014-07-12 VITALS — BP 104/60 | HR 82 | Temp 97.8°F | Resp 12 | Wt 232.0 lb

## 2014-07-12 DIAGNOSIS — N058 Unspecified nephritic syndrome with other morphologic changes: Secondary | ICD-10-CM

## 2014-07-12 DIAGNOSIS — E1129 Type 2 diabetes mellitus with other diabetic kidney complication: Secondary | ICD-10-CM

## 2014-07-12 LAB — LIPID PANEL
CHOL/HDL RATIO: 6
Cholesterol: 229 mg/dL — ABNORMAL HIGH (ref 0–200)
HDL: 35.5 mg/dL — ABNORMAL LOW (ref >39.00)
NONHDL: 193.5
TRIGLYCERIDES: 208 mg/dL — AB (ref 0.0–149.0)
VLDL: 41.6 mg/dL — ABNORMAL HIGH (ref 0.0–40.0)

## 2014-07-12 LAB — COMPREHENSIVE METABOLIC PANEL
ALT: 19 U/L (ref 0–53)
AST: 17 U/L (ref 0–37)
Albumin: 4.2 g/dL (ref 3.5–5.2)
Alkaline Phosphatase: 62 U/L (ref 39–117)
BUN: 24 mg/dL — ABNORMAL HIGH (ref 6–23)
CHLORIDE: 101 meq/L (ref 96–112)
CO2: 27 mEq/L (ref 19–32)
CREATININE: 1.3 mg/dL (ref 0.4–1.5)
Calcium: 9.9 mg/dL (ref 8.4–10.5)
GFR: 68.44 mL/min (ref >60.00)
Glucose, Bld: 165 mg/dL — ABNORMAL HIGH (ref 70–99)
POTASSIUM: 4.6 meq/L (ref 3.5–5.1)
SODIUM: 137 meq/L (ref 135–145)
TOTAL PROTEIN: 8 g/dL (ref 6.0–8.3)
Total Bilirubin: 1.3 mg/dL — ABNORMAL HIGH (ref 0.2–1.2)

## 2014-07-12 LAB — HEMOGLOBIN A1C: Hgb A1c MFr Bld: 7.4 % — ABNORMAL HIGH (ref 4.6–6.5)

## 2014-07-12 LAB — LDL CHOLESTEROL, DIRECT: Direct LDL: 149.4 mg/dL

## 2014-07-12 NOTE — Patient Instructions (Signed)
-   Continue Metformin 1000 mg 2x daily - Continue Invokana 300 mg in am Please return in 3 months with your sugar log.  Pleas stop at the lab.
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
## 2014-07-20 ENCOUNTER — Other Ambulatory Visit: Payer: Self-pay | Admitting: *Deleted

## 2014-07-20 ENCOUNTER — Telehealth: Payer: Self-pay | Admitting: Internal Medicine

## 2014-07-20 MED ORDER — GLUCOSE BLOOD VI STRP: ORAL_STRIP | Status: DC

## 2014-07-20 NOTE — Telephone Encounter (Signed)
Patient states that [facility] acc[facility]    Thank you

## 2014-07-27 ENCOUNTER — Encounter: Payer: Medicare Other | Admitting: Family Medicine

## 2014-08-16 ENCOUNTER — Ambulatory Visit (INDEPENDENT_AMBULATORY_CARE_PROVIDER_SITE_OTHER): Payer: Medicare Other | Admitting: Family Medicine

## 2014-08-16 ENCOUNTER — Encounter: Payer: Self-pay | Admitting: Family Medicine

## 2014-08-16 VITALS — BP 110/80 | Temp 97.8°F | Ht 70.25 in | Wt 231.0 lb

## 2014-08-16 DIAGNOSIS — R351 Nocturia: Secondary | ICD-10-CM

## 2014-08-16 DIAGNOSIS — N529 Male erectile dysfunction, unspecified: Secondary | ICD-10-CM

## 2014-08-16 DIAGNOSIS — N401 Enlarged prostate with lower urinary tract symptoms: Secondary | ICD-10-CM

## 2014-08-16 DIAGNOSIS — I10 Essential (primary) hypertension: Secondary | ICD-10-CM

## 2014-08-16 DIAGNOSIS — E1129 Type 2 diabetes mellitus with other diabetic kidney complication: Secondary | ICD-10-CM

## 2014-08-16 DIAGNOSIS — N058 Unspecified nephritic syndrome with other morphologic changes: Secondary | ICD-10-CM

## 2014-08-16 DIAGNOSIS — E109 Type 1 diabetes mellitus without complications: Secondary | ICD-10-CM

## 2014-08-16 DIAGNOSIS — E785 Hyperlipidemia, unspecified: Secondary | ICD-10-CM

## 2014-08-16 DIAGNOSIS — E139 Other specified diabetes mellitus without complications: Secondary | ICD-10-CM

## 2014-08-16 DIAGNOSIS — N528 Other male erectile dysfunction: Secondary | ICD-10-CM

## 2014-08-16 DIAGNOSIS — Z23 Encounter for immunization: Secondary | ICD-10-CM

## 2014-08-16 DIAGNOSIS — K219 Gastro-esophageal reflux disease without esophagitis: Secondary | ICD-10-CM

## 2014-08-16 LAB — CBC WITH DIFFERENTIAL/PLATELET
BASOS ABS: 0 10*3/uL (ref 0.0–0.1)
Basophils Relative: 0.6 % (ref 0.0–3.0)
Eosinophils Absolute: 0.1 10*3/uL (ref 0.0–0.7)
Eosinophils Relative: 2.6 % (ref 0.0–5.0)
HCT: 50 % (ref 39.0–52.0)
Hemoglobin: 16.6 g/dL (ref 13.0–17.0)
LYMPHS PCT: 44.2 % (ref 12.0–46.0)
Lymphs Abs: 2.4 10*3/uL (ref 0.7–4.0)
MCHC: 33.3 g/dL (ref 30.0–36.0)
MCV: 87.3 fl (ref 78.0–100.0)
Monocytes Absolute: 0.4 10*3/uL (ref 0.1–1.0)
Monocytes Relative: 7.3 % (ref 3.0–12.0)
NEUTROS PCT: 45.3 % (ref 43.0–77.0)
Neutro Abs: 2.5 10*3/uL (ref 1.4–7.7)
Platelets: 162 10*3/uL (ref 150.0–400.0)
RBC: 5.73 Mil/uL (ref 4.22–5.81)
RDW: 13.4 % (ref 11.5–15.5)
WBC: 5.5 10*3/uL (ref 4.0–10.5)

## 2014-08-16 LAB — TSH: TSH: 0.83 u[IU]/mL (ref 0.35–4.50)

## 2014-08-16 LAB — PSA: PSA: 0.32 ng/mL (ref 0.10–4.00)

## 2014-08-16 MED ORDER — LISINOPRIL 20 MG PO TABS: ORAL_TABLET | ORAL | Status: DC

## 2014-08-16 MED ORDER — SIMVASTATIN 40 MG PO TABS: 40.0000 mg | ORAL_TABLET | Freq: Every day | ORAL | Status: DC

## 2014-08-16 MED ORDER — FUROSEMIDE 20 MG PO TABS: 20.0000 mg | ORAL_TABLET | Freq: Every day | ORAL | Status: DC

## 2014-08-16 MED ORDER — METFORMIN HCL 1000 MG PO TABS: 1000.0000 mg | ORAL_TABLET | Freq: Two times a day (BID) | ORAL | Status: DC

## 2014-08-16 NOTE — Progress Notes (Signed)
Subjective:    Patient ID: Billy Hansen, male    DOB: 01/24/1946, 68 y.o.   MRN: [id number]  HPI Billy Hansen is a 68 year old male nonsmoker who comes in today for evaluation of hypertension diabetes and hyperlipidemia  We referred him to Dr. Durel Saltshristine Hansen because of elevation of his blood sugar. He's on metformin 1000 mg twice a day and she added the new drug Invokana. He's lost about 20 some pounds and his blood sugar has dropped down and A1c was 7.4% a month ago.  He takes Lasix 20 mg, lisinopril 40 mg for hypertension BP 110/80  He takes Zocor 20 mg daily lipid panel shows an HDL of 36 LDL 149. We'll increase the Zocor to 40 mg daily to try to get his LDL to 75  He gets routine eye care, dental care, colonoscopy and GI, vaccinations updated by Billy Hansen  ,,,,,,,,,, retired..... For 2 years....... spends a lot of time with his grandchildren and great-grandchildren which he has 7 of!!!!!!!!  Cognitive function normal he walks daily home health safety reviewed no issues identified, no guns in the house, he does not have a healthcare power of attorney normal living well,,,,,,,, was encouraged to do so   Review of Systems  Constitutional: Negative.   HENT: Negative.   Eyes: Negative.   Respiratory: Negative.   Cardiovascular: Negative.   Gastrointestinal: Negative.   Endocrine: Negative.   Genitourinary: Negative.   Musculoskeletal: Negative.   Skin: Negative.   Allergic/Immunologic: Negative.   Neurological: Negative.   Hematological: Negative.   Psychiatric/Behavioral: Negative.        Objective:   Physical Exam  Constitutional: He is oriented to person, place, and time. He appears well-developed and well-nourished.  HENT:  Head: Normocephalic and atraumatic.  Right Ear: External ear normal.  Left Ear: External ear normal.  Nose: Nose normal.  Mouth/Throat: Oropharynx is clear and moist.  Eyes: Conjunctivae and EOM are normal. Pupils are equal, round, and reactive to  light.  Neck: Normal range of motion. Neck supple. No JVD present. No tracheal deviation present. No thyromegaly present.  Cardiovascular: Normal rate, regular rhythm, normal heart sounds and intact distal pulses.  Exam reveals no gallop and no friction rub.   No murmur heard. No carotid nor aortic bruits peripheral pulses 2+ and symmetrical  Pulmonary/Chest: Effort normal and breath sounds normal. No stridor. No respiratory distress. He has no wheezes. He has no rales. He exhibits no tenderness.  Abdominal: Soft. Bowel sounds are normal. He exhibits no distension and no mass. There is no tenderness. There is no rebound and no guarding.  Genitourinary: Rectum normal, prostate normal and penis normal. Guaiac negative stool. No penile tenderness.  Musculoskeletal: Normal range of motion. He exhibits no edema or tenderness.  Lymphadenopathy:    He has no cervical adenopathy.  Neurological: He is alert and oriented to person, place, and time. He has normal reflexes. No cranial nerve deficit. He exhibits normal muscle tone.  Skin: Skin is warm and dry. No rash noted. No erythema. No pallor.  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 recommended soaking and filing weekly to debulk  Psychiatric: He has a normal mood and affect. His behavior is normal. Judgment and thought content normal.  Nursing note and vitals reviewed.         Assessment & Plan:  Healthy male  Hypertension at goal....... continue current therapy  Hyperlipidemia.......Marland Kitchen  not at goal increase Zocor to 40 mg daily follow-up lipid panel in 3 months  Diabetes type 2.......... improved control with input from Dr. Durel Saltshristine Hansen and her addition of Invokana which is also caused 20 some pounds of weight loss....... follow-up with her as outlined

## 2014-08-16 NOTE — Progress Notes (Signed)
Pre visit review using our clinic review tool, if applicable. No additional management support is needed unless otherwise documented below in the visit note. Lab Results  Component Value Date   HGBA1C 7.4* 07/12/2014   HGBA1C 7.3* 04/09/2014   HGBA1C 6.8* 01/06/2014   Lab Results  Component Value Date   MICROALBUR 4.9* 10/29/2012   LDLCALC 98 07/28/2009   CREATININE 1.3 07/12/2014

## 2014-08-16 NOTE — Patient Instructions (Signed)
Continue the Lasix and lisinopril daily  Increase his Zocor to 40 mg daily  Follow-up fasting lipid panel in 3 months............. office visit 1 week after labs  Follow-up with Dr. Durel Saltshristine G as outlined

## 2014-10-12 ENCOUNTER — Encounter: Payer: Self-pay | Admitting: Internal Medicine

## 2014-10-12 ENCOUNTER — Ambulatory Visit (INDEPENDENT_AMBULATORY_CARE_PROVIDER_SITE_OTHER): Payer: Medicaid Other | Admitting: Internal Medicine

## 2014-10-12 VITALS — BP 124/68 | HR 95 | Temp 98.3°F | Resp 12 | Wt 238.8 lb

## 2014-10-12 DIAGNOSIS — E1129 Type 2 diabetes mellitus with other diabetic kidney complication: Secondary | ICD-10-CM

## 2014-10-12 DIAGNOSIS — N182 Chronic kidney disease, stage 2 (mild): Secondary | ICD-10-CM | POA: Diagnosis not present

## 2014-10-12 DIAGNOSIS — E1122 Type 2 diabetes mellitus with diabetic chronic kidney disease: Secondary | ICD-10-CM | POA: Diagnosis not present

## 2014-10-12 DIAGNOSIS — N058 Unspecified nephritic syndrome with other morphologic changes: Secondary | ICD-10-CM

## 2014-10-12 LAB — BASIC METABOLIC PANEL
BUN: 26 mg/dL — AB (ref 6–23)
CHLORIDE: 103 meq/L (ref 96–112)
CO2: 23 mEq/L (ref 19–32)
Calcium: 9.4 mg/dL (ref 8.4–10.5)
Creatinine, Ser: 1.28 mg/dL (ref 0.40–1.50)
GFR: 72.1 mL/min (ref >60.00)
Glucose, Bld: 359 mg/dL — ABNORMAL HIGH (ref 70–99)
POTASSIUM: 4.4 meq/L (ref 3.5–5.1)
Sodium: 136 mEq/L (ref 135–145)

## 2014-10-12 LAB — HEMOGLOBIN A1C: HEMOGLOBIN A1C: 9.1 % — AB (ref 4.6–6.5)

## 2014-10-12 MED ORDER — CANAGLIFLOZIN 300 MG PO TABS: 300.0000 mg | ORAL_TABLET | Freq: Every day | ORAL | Status: DC

## 2014-10-12 NOTE — Patient Instructions (Signed)
Patient Instructions  - Continue Metformin 1000 mg 2x daily - Restart Invokana 300 mg in am Please return in 3 months with your sugar log.  Pleas stop at the lab. Also, have a BMP drawn at the next blood draw for Dr. Tawanna Coolerodd.
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
## 2014-11-01 ENCOUNTER — Other Ambulatory Visit (INDEPENDENT_AMBULATORY_CARE_PROVIDER_SITE_OTHER): Payer: Commercial Managed Care - HMO

## 2014-11-01 DIAGNOSIS — E785 Hyperlipidemia, unspecified: Secondary | ICD-10-CM

## 2014-11-01 LAB — HEPATIC FUNCTION PANEL
ALT: 16 U/L (ref 0–53)
AST: 12 U/L (ref 0–37)
Albumin: 4.6 g/dL (ref 3.5–5.2)
Alkaline Phosphatase: 62 U/L (ref 39–117)
Bilirubin, Direct: 0.2 mg/dL (ref 0.0–0.3)
Total Bilirubin: 1.2 mg/dL (ref 0.2–1.2)
Total Protein: 7.5 g/dL (ref 6.0–8.3)

## 2014-11-01 LAB — LIPID PANEL
Cholesterol: 172 mg/dL (ref 0–200)
HDL: 32.1 mg/dL — ABNORMAL LOW
LDL Cholesterol: 107 mg/dL — ABNORMAL HIGH (ref 0–99)
NonHDL: 139.9
Total CHOL/HDL Ratio: 5
Triglycerides: 163 mg/dL — ABNORMAL HIGH (ref 0.0–149.0)
VLDL: 32.6 mg/dL (ref 0.0–40.0)

## 2014-11-08 ENCOUNTER — Encounter: Payer: Self-pay | Admitting: Family Medicine

## 2014-11-08 ENCOUNTER — Ambulatory Visit (INDEPENDENT_AMBULATORY_CARE_PROVIDER_SITE_OTHER): Payer: Commercial Managed Care - HMO | Admitting: Family Medicine

## 2014-11-08 VITALS — BP 110/70 | Temp 98.0°F | Wt 233.0 lb

## 2014-11-08 DIAGNOSIS — E785 Hyperlipidemia, unspecified: Secondary | ICD-10-CM

## 2014-11-08 NOTE — Progress Notes (Signed)
Pre visit review using our clinic review tool, if applicable. No additional management support is needed unless otherwise documented below in the visit note. 

## 2014-11-08 NOTE — Progress Notes (Signed)
   Subjective:    Patient ID: Billy Hansen, male    DOB: February 26, 1946, 69 y.o.   MRN: [id number]  HPI Billy Hansen is a 69 year old male who comes in today for follow-up of hyperlipidemia  We increased his Zocor from 20-40 mg daily. He has underlying diabetes. We'll try to get his LDL below 75. He comes back today for follow-up ear LDL is down to 107. However he says his blood sugars been up recently. He was office Invokana for couple days.   Review of Systems    review of systems negative no side effects from medication liver function studies normal Objective:   Physical Exam  Well-developed well-nourished male no acute distress vital signs stable he is afebrile BP 110/70      Assessment & Plan:  Hyperlipidemia,,,,,,,, not at goal,,,,,,, however with his blood sugar elevated,,,,,, will wait until next fall continue current medications follow-up in November,,,,,

## 2014-11-08 NOTE — Patient Instructions (Signed)
Continue current medications  Follow-up next November for your annual physical examination  Labs one week prior

## 2015-01-11 ENCOUNTER — Ambulatory Visit (INDEPENDENT_AMBULATORY_CARE_PROVIDER_SITE_OTHER): Payer: Commercial Managed Care - HMO | Admitting: Internal Medicine

## 2015-01-11 ENCOUNTER — Encounter: Payer: Self-pay | Admitting: Internal Medicine

## 2015-01-11 VITALS — BP 110/62 | HR 91 | Temp 97.8°F | Resp 12 | Wt 227.0 lb

## 2015-01-11 DIAGNOSIS — N058 Unspecified nephritic syndrome with other morphologic changes: Secondary | ICD-10-CM

## 2015-01-11 DIAGNOSIS — E1129 Type 2 diabetes mellitus with other diabetic kidney complication: Secondary | ICD-10-CM

## 2015-01-11 LAB — BASIC METABOLIC PANEL
BUN: 25 mg/dL — AB (ref 6–23)
CALCIUM: 9.7 mg/dL (ref 8.4–10.5)
CO2: 22 mEq/L (ref 19–32)
CREATININE: 1.26 mg/dL (ref 0.40–1.50)
Chloride: 103 mEq/L (ref 96–112)
GFR: 73.37 mL/min (ref >60.00)
Glucose, Bld: 203 mg/dL — ABNORMAL HIGH (ref 70–99)
Potassium: 4.6 mEq/L (ref 3.5–5.1)
SODIUM: 134 meq/L — AB (ref 135–145)

## 2015-01-11 LAB — HEMOGLOBIN A1C: Hgb A1c MFr Bld: 8.4 % — ABNORMAL HIGH (ref 4.6–6.5)
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
## 2015-01-11 NOTE — Patient Instructions (Signed)
Patient Instructions  - Continue Metformin 1000 mg 2x daily - Continue Invokana 300 mg in am  Please return in 3 months with your sugar log.   Pleas stop at the lab.   Please call and schedule an eye appt with Dr. Randon GoldsmithLyles: [facility]:  Dr. Jeni SallesLyles Graham W. MD ?  Address: [address], [place], [place] [address]  [phone]

## 2015-04-12 ENCOUNTER — Encounter: Payer: Self-pay | Admitting: Internal Medicine

## 2015-04-12 ENCOUNTER — Other Ambulatory Visit (INDEPENDENT_AMBULATORY_CARE_PROVIDER_SITE_OTHER): Payer: Commercial Managed Care - HMO | Admitting: *Deleted

## 2015-04-12 ENCOUNTER — Ambulatory Visit (INDEPENDENT_AMBULATORY_CARE_PROVIDER_SITE_OTHER): Payer: Commercial Managed Care - HMO | Admitting: Internal Medicine

## 2015-04-12 VITALS — BP 112/60 | HR 85 | Temp 98.0°F | Resp 12 | Wt 232.0 lb

## 2015-04-12 DIAGNOSIS — E1129 Type 2 diabetes mellitus with other diabetic kidney complication: Secondary | ICD-10-CM

## 2015-04-12 DIAGNOSIS — E1122 Type 2 diabetes mellitus with diabetic chronic kidney disease: Secondary | ICD-10-CM

## 2015-04-12 DIAGNOSIS — N058 Unspecified nephritic syndrome with other morphologic changes: Secondary | ICD-10-CM | POA: Diagnosis not present

## 2015-04-12 DIAGNOSIS — N182 Chronic kidney disease, stage 2 (mild): Principal | ICD-10-CM

## 2015-04-12 LAB — POCT GLYCOSYLATED HEMOGLOBIN (HGB A1C): Hemoglobin A1C: 7.7

## 2015-04-12 MED ORDER — CANAGLIFLOZIN 300 MG PO TABS: 300.0000 mg | ORAL_TABLET | Freq: Every day | ORAL | Status: DC

## 2015-04-12 NOTE — Patient Instructions (Signed)
  Patient Instructions  - Continue Metformin 1000 mg 2x daily - Continue Invokana 300 mg in am  Please return in 3 months with your sugar log. 
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
## 2015-04-22 LAB — HM DIABETES EYE EXAM

## 2015-04-25 ENCOUNTER — Encounter: Payer: Self-pay | Admitting: Family Medicine

## 2015-05-17 ENCOUNTER — Encounter (HOSPITAL_COMMUNITY): Payer: Self-pay | Admitting: Emergency Medicine

## 2015-05-17 ENCOUNTER — Emergency Department (HOSPITAL_COMMUNITY)
Admission: EM | Admit: 2015-05-17 | Discharge: 2015-05-17 | Disposition: A | Discharge status: Home / Self Care | Payer: Commercial Managed Care - HMO | Attending: Emergency Medicine | Admitting: Emergency Medicine

## 2015-05-17 ENCOUNTER — Emergency Department (HOSPITAL_COMMUNITY): Payer: Commercial Managed Care - HMO

## 2015-05-17 DIAGNOSIS — S92031A Displaced avulsion fracture of tuberosity of right calcaneus, initial encounter for closed fracture: Secondary | ICD-10-CM | POA: Diagnosis not present

## 2015-05-17 DIAGNOSIS — Y999 Unspecified external cause status: Secondary | ICD-10-CM | POA: Insufficient documentation

## 2015-05-17 DIAGNOSIS — Y929 Unspecified place or not applicable: Secondary | ICD-10-CM | POA: Insufficient documentation

## 2015-05-17 DIAGNOSIS — W109XXA Fall (on) (from) unspecified stairs and steps, initial encounter: Secondary | ICD-10-CM | POA: Diagnosis not present

## 2015-05-17 DIAGNOSIS — E785 Hyperlipidemia, unspecified: Secondary | ICD-10-CM | POA: Diagnosis not present

## 2015-05-17 DIAGNOSIS — Z79899 Other long term (current) drug therapy: Secondary | ICD-10-CM | POA: Diagnosis not present

## 2015-05-17 DIAGNOSIS — Y939 Activity, unspecified: Secondary | ICD-10-CM | POA: Diagnosis not present

## 2015-05-17 DIAGNOSIS — Z794 Long term (current) use of insulin: Secondary | ICD-10-CM | POA: Diagnosis not present

## 2015-05-17 DIAGNOSIS — X58XXXA Exposure to other specified factors, initial encounter: Secondary | ICD-10-CM | POA: Diagnosis not present

## 2015-05-17 DIAGNOSIS — I10 Essential (primary) hypertension: Secondary | ICD-10-CM | POA: Diagnosis not present

## 2015-05-17 DIAGNOSIS — E119 Type 2 diabetes mellitus without complications: Secondary | ICD-10-CM | POA: Diagnosis not present

## 2015-05-17 DIAGNOSIS — S99911A Unspecified injury of right ankle, initial encounter: Secondary | ICD-10-CM | POA: Diagnosis present

## 2015-05-17 DIAGNOSIS — S92001A Unspecified fracture of right calcaneus, initial encounter for closed fracture: Secondary | ICD-10-CM

## 2015-05-17 MED ORDER — HYDROCODONE-ACETAMINOPHEN 5-325 MG PO TABS: 1.0000 | ORAL_TABLET | Freq: Four times a day (QID) | ORAL | Status: DC | PRN

## 2015-05-17 NOTE — ED Notes (Signed)
Pt sts right ankle pain after falling and twisting today; pt sts painful to ambulate

## 2015-05-17 NOTE — ED Provider Notes (Signed)
CSN: [id number]     Arrival date & time 05/17/15  1711 History  This chart was scribed for non-physician practitioner Arthor Captain, PA-C working with Richardean Canal, MD by Lyndel Safe, ED Scribe. This patient was seen in room TR05C/TR05C and the patient's care was started at 5:46 PM.   Chief Complaint  Patient presents with  . Ankle Pain   The history is provided by the patient. No language interpreter was used.   HPI Comments: Billy Hansen is a 69 y.o. male who presents to the Emergency Department complaining of sudden onset, constant, moderate right ankle pain s/p fall that occurred 6 hours ago. He has associated swelling to lateral aspect of right ankle. Pt reports he fell down 1 stair while carrying several items. He notes during the fall, his right foot became caught in the stairwell and he twisted his right lower leg hearing a pop in his right ankle during the accident. His pain is exacerbated with ambulation and any ROM movements. He denies a wound to the affected area.   Past Medical History  Diagnosis Date  . Allergy   . Hyperlipidemia   . Hypertension   . Diabetes mellitus without complication    Past Surgical History  Procedure Laterality Date  . Colon surgery    . Knee surgery      right knee   Family History  Problem Relation Age of Onset  . Cancer Mother    Social History  Substance Use Topics  . Smoking status: Never Smoker   . Smokeless tobacco: None  . Alcohol Use: No    Review of Systems  Musculoskeletal: Positive for joint swelling ( right ankle) and arthralgias ( right ankle).  Skin: Negative for wound.   Allergies  Januvia and Naproxen sodium  Home Medications   Prior to Admission medications   Medication Sig Start Date End Date Taking? Authorizing Provider  canagliflozin (INVOKANA) 300 MG TABS tablet Take 300 mg by mouth daily with breakfast. 04/12/15   Carlus Pavlov, MD  furosemide (LASIX) 20 MG tablet Take 1 tablet (20 mg total) by mouth  daily. 08/16/14   Roderick Pee, MD  glucose blood (ACCU-CHEK COMPACT PLUS) test strip Use to test blood sugar 1 time daily as instructed. Dx code: E11.29 07/20/14   Carlus Pavlov, MD  Insulin Pen Needle (BD PEN NEEDLE NANO U/F) 32G X 4 MM MISC 1 each by Does not apply route at bedtime.    Historical Provider, MD  lisinopril (PRINIVIL,ZESTRIL) 20 MG tablet 2 by mouth every morning 08/16/14   Roderick Pee, MD  metFORMIN (GLUCOPHAGE) 1000 MG tablet Take 1 tablet (1,000 mg total) by mouth 2 (two) times daily with a meal. 08/16/14   Roderick Pee, MD  simvastatin (ZOCOR) 40 MG tablet Take 1 tablet (40 mg total) by mouth at bedtime. 08/16/14   Roderick Pee, MD   BP 135/81 mmHg  Pulse 110  Temp(Src) 98.6 F (37 C) (Oral)  Resp 16  Ht 5\' 11"  (1.803 m)  Wt 232 lb (105.235 kg)  BMI 32.37 kg/m2  SpO2 95% Physical Exam  Constitutional: He appears well-developed and well-nourished. No distress.  HENT:  Head: Normocephalic.  Eyes: Conjunctivae are normal. Right eye exhibits no discharge. Left eye exhibits no discharge. No scleral icterus.  Neck: No JVD present.  Pulmonary/Chest: Effort normal. No respiratory distress.  Neurological: He is alert. Coordination normal.  Skin: Skin is warm. No rash noted. No erythema. No pallor.  Psychiatric:  He has a normal mood and affect. His behavior is normal.  Nursing note and vitals reviewed.  ED Course  Procedures  DIAGNOSTIC STUDIES: Oxygen Saturation is 95% on RA, adequate by my interpretation.    COORDINATION OF CARE: 5:50 PM Discussed treatment plan which includes to order Xray of right ankle and right foot with pt. Pt acknowledges and agrees to plan.  6:58 PM Discussed Xray results with pt. Will prescribe pain medication, order crutches and cam walker, and give ortho referral. Pt acknowledges and agrees to plan.   Imaging Review Dg Ankle Complete Right  05/17/2015   CLINICAL DATA:  Patient with right ankle inversion injury.  EXAM: RIGHT  ANKLE - COMPLETE 3+ VIEW  COMPARISON:  None.  FINDINGS: Normal anatomic alignment. Small avulsed fragment is demonstrated off of the lateral aspect of the calcaneus. Ankle mortise is intact. Distal tibia and fibula are unremarkable. Soft tissue swelling about the ankle. Midfoot degenerative changes. Plantar and posterior calcaneal spurring.  IMPRESSION: Avulsed fragment off the lateral distal calcaneus.   Electronically Signed   By: Annia Belt M.D.   On: 05/17/2015 18:40   Dg Foot Complete Right  05/17/2015   CLINICAL DATA:  Fall, twisting right ankle today. Ankle and foot pain.  EXAM: RIGHT FOOT COMPLETE - 3+ VIEW  COMPARISON:  None  FINDINGS: Small avulsed fragments noted off the lateral distal calcaneus. No additional acute bony abnormality. Small plantar calcaneal spur. Soft tissues are intact.  IMPRESSION: Small avulsed fragment off the lateral distal calcaneus.   Electronically Signed   By: Charlett Nose M.D.   On: 05/17/2015 18:34   I have personally reviewed and evaluated these images and lab results as part of my medical decision-making.  MDM   Final diagnoses:  Avulsion fracture of calcaneus, right, closed, initial encounter   Patient with small calcaneal avulsion fracture. NVI. Will treat with cam walker, crutches. Patient is to follow closely with Ortho.  I personally performed the services described in this documentation, which was scribed in my presence. The recorded information has been reviewed and is accurate.      Arthor Captain, PA-C 05/17/15 [id number]  Richardean Canal, MD 05/18/15 [phone]

## 2015-05-17 NOTE — Discharge Instructions (Signed)
Ankle Fracture  A fracture is a break in a bone. The ankle joint is made up of three bones. These include the lower (distal)sections of your lower leg bones, called the tibia and fibula, along with a bone in your foot, called the talus. Depending on how bad the break is and if more than one ankle joint bone is broken, a cast or splint is used to protect and keep your injured bone from moving while it heals. Sometimes, surgery is required to help the fracture heal properly.   There are two general types of fractures:   Stable fracture. This includes a single fracture line through one bone, with no injury to ankle ligaments. A fracture of the talus that does not have any displacement (movement of the bone on either side of the fracture line) is also stable.   Unstable fracture. This includes more than one fracture line through one or more bones in the ankle joint. It also includes fractures that have displacement of the bone on either side of the fracture line.  CAUSES   A direct blow to the ankle.    Quickly and severely twisting your ankle.   Trauma, such as a car accident or falling from a significant height.  RISK FACTORS  You may be at a higher risk of ankle fracture if:   You have certain medical conditions.   You are involved in high-impact sports.   You are involved in a high-impact car accident.  SIGNS AND SYMPTOMS    Tender and swollen ankle.   Bruising around the injured ankle.   Pain on movement of the ankle.   Difficulty walking or putting weight on the ankle.   A cold foot below the site of the ankle injury. This can occur if the blood vessels passing through your injured ankle were also damaged.   Numbness in the foot below the site of the ankle injury.  DIAGNOSIS   An ankle fracture is usually diagnosed with a physical exam and X-rays. A CT scan may also be required for complex fractures.  TREATMENT   Stable fractures are treated with a cast or splint and using crutches to avoid putting  weight on your injured ankle. This is followed by an ankle strengthening program. Some patients require a special type of cast, depending on other medical problems they may have. Unstable fractures require surgery to ensure the bones heal properly. Your health care provider will tell you what type of fracture you have and the best treatment for your condition.  HOME CARE INSTRUCTIONS    Review correct crutch use with your health care provider and use your crutches as directed. Safe use of crutches is extremely important. Misuse of crutches can cause you to fall or cause injury to nerves in your hands or armpits.   Do not put weight or pressure on the injured ankle until directed by your health care provider.   To lessen the swelling, keep the injured leg elevated while sitting or lying down.   Apply ice to the injured area:   Put ice in a plastic bag.   Place a towel between your cast and the bag.   Leave the ice on for 20 minutes, 2-3 times a day.   If you have a plaster or fiberglass cast:   Do not try to scratch the skin under the cast with any objects. This can increase your risk of skin infection.   Check the skin around the cast every day. You   may put lotion on any red or sore areas.   Keep your cast dry and clean.   If you have a plaster splint:   Wear the splint as directed.   You may loosen the elastic around the splint if your toes become numb, tingle, or turn cold or blue.   Do not put pressure on any part of your cast or splint; it may break. Rest your cast only on a pillow the first 24 hours until it is fully hardened.   Your cast or splint can be protected during bathing with a plastic bag sealed to your skin with medical tape. Do not lower the cast or splint into water.   Take medicines as directed by your health care provider. Only take over-the-counter or prescription medicines for pain, discomfort, or fever as directed by your health care provider.   Do not drive a vehicle until  your health care provider specifically tells you it is safe to do so.   If your health care provider has given you a follow-up appointment, it is very important to keep that appointment. Not keeping the appointment could result in a chronic or permanent injury, pain, and disability. If you have any problem keeping the appointment, call the facility for assistance.  SEEK MEDICAL CARE IF:  You develop increased swelling or discomfort.  SEEK IMMEDIATE MEDICAL CARE IF:    Your cast gets damaged or breaks.   You have continued severe pain.   You develop new pain or swelling after the cast was put on.   Your skin or toenails below the injury turn blue or gray.   Your skin or toenails below the injury feel cold, numb, or have loss of sensitivity to touch.   There is a bad smell or pus draining from under the cast.  MAKE SURE YOU:    Understand these instructions.   Will watch your condition.   Will get help right away if you are not doing well or get worse.  Document Released: 08/31/2000 Document Revised: 09/08/2013 Document Reviewed: 04/02/2013  Exit[employer] Patient Information 2015 [employer]. This information is not intended to replace advice given to you by your health care provider. Make sure you discuss any questions you have with your health care provider.

## 2015-05-18 ENCOUNTER — Encounter: Payer: Self-pay | Admitting: Internal Medicine

## 2015-05-18 ENCOUNTER — Telehealth: Payer: Self-pay | Admitting: Family Medicine

## 2015-05-18 DIAGNOSIS — S92901A Unspecified fracture of right foot, initial encounter for closed fracture: Secondary | ICD-10-CM

## 2015-05-18 NOTE — Telephone Encounter (Signed)
Pt need a referral to see Deboraha Sprang at [facility] for a broken foot

## 2015-05-20 ENCOUNTER — Encounter: Payer: Self-pay | Admitting: Internal Medicine

## 2015-07-15 ENCOUNTER — Other Ambulatory Visit: Payer: Commercial Managed Care - HMO | Admitting: *Deleted

## 2015-07-15 ENCOUNTER — Encounter: Payer: Self-pay | Admitting: Internal Medicine

## 2015-07-15 ENCOUNTER — Ambulatory Visit (INDEPENDENT_AMBULATORY_CARE_PROVIDER_SITE_OTHER): Payer: Commercial Managed Care - HMO | Admitting: Internal Medicine

## 2015-07-15 VITALS — BP 106/62 | HR 83 | Temp 97.7°F | Resp 12 | Wt 227.8 lb

## 2015-07-15 DIAGNOSIS — E1121 Type 2 diabetes mellitus with diabetic nephropathy: Secondary | ICD-10-CM | POA: Diagnosis not present

## 2015-07-15 DIAGNOSIS — N182 Chronic kidney disease, stage 2 (mild): Principal | ICD-10-CM

## 2015-07-15 DIAGNOSIS — Z23 Encounter for immunization: Secondary | ICD-10-CM

## 2015-07-15 DIAGNOSIS — E1122 Type 2 diabetes mellitus with diabetic chronic kidney disease: Secondary | ICD-10-CM

## 2015-07-15 LAB — POCT GLYCOSYLATED HEMOGLOBIN (HGB A1C): HEMOGLOBIN A1C: 7.7

## 2015-07-15 MED ORDER — CANAGLIFLOZIN 300 MG PO TABS: 300.0000 mg | ORAL_TABLET | Freq: Every day | ORAL | Status: DC

## 2015-07-15 NOTE — Patient Instructions (Signed)
  Patient Instructions  - Continue Metformin 1000 mg 2x daily - Continue Invokana 300 mg in am  Please return in 3 months with your sugar log.
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
## 2015-08-02 ENCOUNTER — Encounter: Payer: Commercial Managed Care - HMO | Admitting: Family Medicine

## 2015-08-05 ENCOUNTER — Other Ambulatory Visit (INDEPENDENT_AMBULATORY_CARE_PROVIDER_SITE_OTHER): Payer: Commercial Managed Care - HMO

## 2015-08-05 DIAGNOSIS — E1129 Type 2 diabetes mellitus with other diabetic kidney complication: Secondary | ICD-10-CM

## 2015-08-05 DIAGNOSIS — E785 Hyperlipidemia, unspecified: Secondary | ICD-10-CM

## 2015-08-05 LAB — HEPATIC FUNCTION PANEL
ALT: 14 U/L (ref 0–53)
AST: 12 U/L (ref 0–37)
Albumin: 4.4 g/dL (ref 3.5–5.2)
Alkaline Phosphatase: 64 U/L (ref 39–117)
BILIRUBIN DIRECT: 0.2 mg/dL (ref 0.0–0.3)
TOTAL PROTEIN: 6.6 g/dL (ref 6.0–8.3)
Total Bilirubin: 1 mg/dL (ref 0.2–1.2)

## 2015-08-05 LAB — POCT URINALYSIS DIPSTICK
Bilirubin, UA: NEGATIVE
Blood, UA: NEGATIVE
Ketones, UA: NEGATIVE
Leukocytes, UA: NEGATIVE
Nitrite, UA: NEGATIVE
Protein, UA: NEGATIVE
Spec Grav, UA: <=1.005
Urobilinogen, UA: 0.2
pH, UA: 5

## 2015-08-05 LAB — LIPID PANEL
Cholesterol: 157 mg/dL (ref 0–200)
HDL: 39.2 mg/dL
LDL Cholesterol: 82 mg/dL (ref 0–99)
NonHDL: 117.65
Total CHOL/HDL Ratio: 4
Triglycerides: 177 mg/dL — ABNORMAL HIGH (ref 0.0–149.0)
VLDL: 35.4 mg/dL (ref 0.0–40.0)

## 2015-08-05 LAB — TSH: TSH: 1.41 u[IU]/mL (ref 0.35–4.50)

## 2015-08-05 LAB — CBC WITH DIFFERENTIAL/PLATELET
Basophils Absolute: 0 K/uL (ref 0.0–0.1)
Basophils Relative: 0.5 % (ref 0.0–3.0)
Eosinophils Absolute: 0.1 K/uL (ref 0.0–0.7)
Eosinophils Relative: 2 % (ref 0.0–5.0)
HCT: 48.4 % (ref 39.0–52.0)
Hemoglobin: 16.1 g/dL (ref 13.0–17.0)
Lymphocytes Relative: 42.4 % (ref 12.0–46.0)
Lymphs Abs: 2.4 K/uL (ref 0.7–4.0)
MCHC: 33.2 g/dL (ref 30.0–36.0)
MCV: 87.4 fl (ref 78.0–100.0)
Monocytes Absolute: 0.4 K/uL (ref 0.1–1.0)
Monocytes Relative: 7.1 % (ref 3.0–12.0)
Neutro Abs: 2.7 K/uL (ref 1.4–7.7)
Neutrophils Relative %: 48 % (ref 43.0–77.0)
Platelets: 158 K/uL (ref 150.0–400.0)
RBC: 5.53 Mil/uL (ref 4.22–5.81)
RDW: 13.6 % (ref 11.5–15.5)
WBC: 5.7 K/uL (ref 4.0–10.5)

## 2015-08-05 LAB — MICROALBUMIN / CREATININE URINE RATIO
Creatinine,U: 11.4 mg/dL
Microalb Creat Ratio: 6.2 mg/g (ref 0.0–30.0)
Microalb, Ur: <0.7 mg/dL (ref 0.0–1.9)

## 2015-08-05 LAB — HEMOGLOBIN A1C: Hgb A1c MFr Bld: 7.9 % — ABNORMAL HIGH (ref 4.6–6.5)

## 2015-08-05 LAB — BASIC METABOLIC PANEL WITH GFR
BUN: 17 mg/dL (ref 6–23)
CO2: 27 meq/L (ref 19–32)
Calcium: 9.4 mg/dL (ref 8.4–10.5)
Chloride: 102 meq/L (ref 96–112)
Creatinine, Ser: 1.2 mg/dL (ref 0.40–1.50)
GFR: 77.49 mL/min
Glucose, Bld: 226 mg/dL — ABNORMAL HIGH (ref 70–99)
Potassium: 4.4 meq/L (ref 3.5–5.1)
Sodium: 139 meq/L (ref 135–145)

## 2015-08-05 LAB — PSA: PSA: 0.35 ng/mL (ref 0.10–4.00)

## 2015-08-09 ENCOUNTER — Ambulatory Visit (INDEPENDENT_AMBULATORY_CARE_PROVIDER_SITE_OTHER): Payer: Commercial Managed Care - HMO | Admitting: Family Medicine

## 2015-08-09 ENCOUNTER — Other Ambulatory Visit: Payer: Self-pay | Admitting: Family Medicine

## 2015-08-09 ENCOUNTER — Encounter: Payer: Self-pay | Admitting: Family Medicine

## 2015-08-09 VITALS — BP 110/80 | Temp 97.8°F | Ht 70.0 in | Wt 230.0 lb

## 2015-08-09 DIAGNOSIS — Z0000 Encounter for general adult medical examination without abnormal findings: Secondary | ICD-10-CM

## 2015-08-09 DIAGNOSIS — E139 Other specified diabetes mellitus without complications: Secondary | ICD-10-CM

## 2015-08-09 DIAGNOSIS — E1121 Type 2 diabetes mellitus with diabetic nephropathy: Secondary | ICD-10-CM | POA: Diagnosis not present

## 2015-08-09 DIAGNOSIS — E049 Nontoxic goiter, unspecified: Secondary | ICD-10-CM

## 2015-08-09 DIAGNOSIS — E1122 Type 2 diabetes mellitus with diabetic chronic kidney disease: Secondary | ICD-10-CM

## 2015-08-09 DIAGNOSIS — N182 Chronic kidney disease, stage 2 (mild): Secondary | ICD-10-CM

## 2015-08-09 DIAGNOSIS — E785 Hyperlipidemia, unspecified: Secondary | ICD-10-CM

## 2015-08-09 DIAGNOSIS — I10 Essential (primary) hypertension: Secondary | ICD-10-CM

## 2015-08-09 MED ORDER — SIMVASTATIN 40 MG PO TABS: 40.0000 mg | ORAL_TABLET | Freq: Every day | ORAL | Status: DC

## 2015-08-09 MED ORDER — FUROSEMIDE 20 MG PO TABS: 20.0000 mg | ORAL_TABLET | Freq: Every day | ORAL | Status: DC

## 2015-08-09 MED ORDER — LISINOPRIL 20 MG PO TABS: ORAL_TABLET | ORAL | Status: DC

## 2015-08-09 MED ORDER — METFORMIN HCL 1000 MG PO TABS: 1000.0000 mg | ORAL_TABLET | Freq: Two times a day (BID) | ORAL | Status: DC

## 2015-08-09 NOTE — Progress Notes (Signed)
Pre visit review using our clinic review tool, if applicable. No additional management support is needed unless otherwise documented below in the visit note. 
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
## 2015-08-09 NOTE — Patient Instructions (Addendum)
Continue current medications  Continue follow-up with endocrinology about your blood sugar  If you don't see any improvement in your knee in 2-3 weeks contact orthopedist Dr. Norlene CampbellPeter Whitfield  For your knee pain I would recommend Motrin 400 mg twice daily, elevation and ice 30 minutes twice daily  I will send a note to GI about your colonoscopy  Shingles vaccine  Kandee Keenory and [name]....... are 2 new adult nurse practitioner's...........Marland Kitchen. Dr. SwazilandJordan  Call in August for your physical examination in November  Pick one of the new folks for your physical examination in long-term care  We will get a scan and uptake of your thyroid gland. Make an appointment a week after your study for follow-up with me

## 2015-08-10 ENCOUNTER — Other Ambulatory Visit: Payer: Self-pay | Admitting: Family Medicine

## 2015-08-10 DIAGNOSIS — E049 Nontoxic goiter, unspecified: Secondary | ICD-10-CM

## 2015-08-16 ENCOUNTER — Ambulatory Visit: Payer: Commercial Managed Care - HMO | Admitting: Internal Medicine

## 2015-08-22 ENCOUNTER — Encounter (HOSPITAL_COMMUNITY): Payer: Commercial Managed Care - HMO

## 2015-08-22 ENCOUNTER — Encounter (HOSPITAL_COMMUNITY)
Admission: RE | Admit: 2015-08-22 | Discharge: 2015-08-22 | Disposition: A | Discharge status: Home / Self Care | Payer: Medicare Other | Source: Ambulatory Visit | Attending: Family Medicine | Admitting: Family Medicine

## 2015-08-22 DIAGNOSIS — E049 Nontoxic goiter, unspecified: Secondary | ICD-10-CM | POA: Insufficient documentation

## 2015-08-23 ENCOUNTER — Encounter (HOSPITAL_COMMUNITY)
Admission: RE | Admit: 2015-08-23 | Discharge: 2015-08-23 | Disposition: A | Discharge status: Home / Self Care | Payer: Medicare Other | Source: Ambulatory Visit | Attending: Family Medicine | Admitting: Family Medicine

## 2015-08-23 ENCOUNTER — Encounter (HOSPITAL_COMMUNITY): Payer: Commercial Managed Care - HMO

## 2015-08-23 DIAGNOSIS — E049 Nontoxic goiter, unspecified: Secondary | ICD-10-CM | POA: Diagnosis not present

## 2015-08-23 MED ORDER — SODIUM PERTECHNETATE TC 99M INJECTION
10.5000 | Freq: Once | INTRAVENOUS | Status: AC | PRN
  Administered 2015-08-23: 10.5 via INTRAVENOUS

## 2015-09-14 ENCOUNTER — Telehealth: Payer: Self-pay | Admitting: Internal Medicine

## 2015-09-14 ENCOUNTER — Other Ambulatory Visit: Payer: Self-pay | Admitting: *Deleted

## 2015-09-14 MED ORDER — GLUCOSE BLOOD VI STRP: ORAL_STRIP | Status: DC

## 2015-09-14 NOTE — Telephone Encounter (Signed)
rx sent

## 2015-09-14 NOTE — Telephone Encounter (Signed)
Patient need refill of Accu check compact plus [facility]  [facility] [place], [place] - [address] [phone] (Phone) [phone] (Fax)

## 2015-09-22 ENCOUNTER — Other Ambulatory Visit (HOSPITAL_COMMUNITY): Payer: Commercial Managed Care - HMO

## 2015-09-29 ENCOUNTER — Telehealth: Payer: Self-pay | Admitting: Family Medicine

## 2015-09-29 DIAGNOSIS — I10 Essential (primary) hypertension: Secondary | ICD-10-CM

## 2015-09-29 MED ORDER — FUROSEMIDE 20 MG PO TABS: 20.0000 mg | ORAL_TABLET | Freq: Every day | ORAL | Status: DC

## 2015-09-29 MED ORDER — LISINOPRIL 20 MG PO TABS: ORAL_TABLET | ORAL | Status: DC

## 2015-09-29 MED ORDER — SIMVASTATIN 40 MG PO TABS: 40.0000 mg | ORAL_TABLET | Freq: Every day | ORAL | Status: DC

## 2015-09-29 NOTE — Telephone Encounter (Signed)
Refills sent

## 2015-09-29 NOTE — Telephone Encounter (Signed)
Pt needs refills on simvastatin,furosemide and lisinopril #90 each w/refills send to [facility] on [address]

## 2015-10-14 ENCOUNTER — Encounter: Payer: Self-pay | Admitting: Internal Medicine

## 2015-10-14 ENCOUNTER — Ambulatory Visit (INDEPENDENT_AMBULATORY_CARE_PROVIDER_SITE_OTHER): Payer: Medicare Other | Admitting: Internal Medicine

## 2015-10-14 ENCOUNTER — Other Ambulatory Visit (INDEPENDENT_AMBULATORY_CARE_PROVIDER_SITE_OTHER): Payer: Medicare Other | Admitting: *Deleted

## 2015-10-14 VITALS — BP 108/64 | HR 61 | Temp 98.0°F | Resp 12 | Wt 233.6 lb

## 2015-10-14 DIAGNOSIS — N183 Chronic kidney disease, stage 3 unspecified: Secondary | ICD-10-CM

## 2015-10-14 DIAGNOSIS — E1121 Type 2 diabetes mellitus with diabetic nephropathy: Secondary | ICD-10-CM | POA: Insufficient documentation

## 2015-10-14 DIAGNOSIS — E1122 Type 2 diabetes mellitus with diabetic chronic kidney disease: Secondary | ICD-10-CM | POA: Diagnosis not present

## 2015-10-14 DIAGNOSIS — N182 Chronic kidney disease, stage 2 (mild): Secondary | ICD-10-CM | POA: Diagnosis not present

## 2015-10-14 LAB — POCT GLYCOSYLATED HEMOGLOBIN (HGB A1C): Hemoglobin A1C: 7.4

## 2015-10-14 NOTE — Patient Instructions (Signed)
-   Please move Metformin all with dinner: 2000 mg.  - Continue Invokana 300 mg in am  Please return in 3 months with your sugar log.
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
## 2015-12-09 ENCOUNTER — Encounter: Payer: Self-pay | Admitting: Internal Medicine

## 2016-01-13 ENCOUNTER — Encounter: Payer: Self-pay | Admitting: Internal Medicine

## 2016-01-13 ENCOUNTER — Other Ambulatory Visit (INDEPENDENT_AMBULATORY_CARE_PROVIDER_SITE_OTHER): Payer: Medicare Other | Admitting: *Deleted

## 2016-01-13 ENCOUNTER — Ambulatory Visit (INDEPENDENT_AMBULATORY_CARE_PROVIDER_SITE_OTHER): Payer: Medicare Other | Admitting: Internal Medicine

## 2016-01-13 VITALS — BP 130/74 | HR 94 | Temp 98.2°F | Resp 12 | Wt 231.0 lb

## 2016-01-13 DIAGNOSIS — E1122 Type 2 diabetes mellitus with diabetic chronic kidney disease: Secondary | ICD-10-CM | POA: Diagnosis not present

## 2016-01-13 DIAGNOSIS — N182 Chronic kidney disease, stage 2 (mild): Secondary | ICD-10-CM

## 2016-01-13 LAB — POCT GLYCOSYLATED HEMOGLOBIN (HGB A1C): HEMOGLOBIN A1C: 8.2

## 2016-01-13 MED ORDER — CANAGLIFLOZIN 300 MG PO TABS: 300.0000 mg | ORAL_TABLET | Freq: Every day | ORAL | Status: DC
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
## 2016-01-13 NOTE — Patient Instructions (Signed)
Patient Instructions  Please continue - Metformin 1000 mg 2x a day - Invokana 300 mg in am  Please return in 3 months with your sugar log.

## 2016-01-14 LAB — FRUCTOSAMINE: FRUCTOSAMINE: 347 umol/L — AB (ref 0–285)

## 2016-01-18 ENCOUNTER — Ambulatory Visit (AMBULATORY_SURGERY_CENTER): Payer: Self-pay | Admitting: *Deleted

## 2016-01-18 VITALS — Ht 70.0 in | Wt 234.0 lb

## 2016-01-18 DIAGNOSIS — Z1211 Encounter for screening for malignant neoplasm of colon: Secondary | ICD-10-CM

## 2016-01-18 NOTE — Progress Notes (Signed)
Patient denies any allergies to eggs or soy. Patient denies any problems with anesthesia/sedation. Patient denies any oxygen use at home and does not take any diet/weight loss medications.  Patient declined emmi video information.

## 2016-02-01 ENCOUNTER — Ambulatory Visit (AMBULATORY_SURGERY_CENTER): Payer: Medicare Other | Admitting: Internal Medicine

## 2016-02-01 ENCOUNTER — Encounter: Payer: Self-pay | Admitting: Internal Medicine

## 2016-02-01 VITALS — BP 120/76 | HR 67 | Temp 99.6°F | Resp 13 | Ht 70.0 in | Wt 231.0 lb

## 2016-02-01 DIAGNOSIS — Z1211 Encounter for screening for malignant neoplasm of colon: Secondary | ICD-10-CM | POA: Diagnosis present

## 2016-02-01 DIAGNOSIS — E119 Type 2 diabetes mellitus without complications: Secondary | ICD-10-CM | POA: Diagnosis not present

## 2016-02-01 LAB — GLUCOSE, CAPILLARY
GLUCOSE-CAPILLARY: 119 mg/dL — AB (ref 65–99)
Glucose-Capillary: 107 mg/dL — ABNORMAL HIGH (ref 65–99)

## 2016-02-01 MED ORDER — SODIUM CHLORIDE 0.9 % IV SOLN: 500.0000 mL | INTRAVENOUS | Status: DC

## 2016-02-01 NOTE — Patient Instructions (Addendum)
No polyps or cancer seen!  Next routine colonoscopy in 10 years - 2027  I appreciate the opportunity to care for you. Billy Booparl E. Amyria Komar, MD, [facility]  Discharge instructions given. Normal exam. Resume previous medications. YOU HAD AN ENDOSCOPIC PROCEDURE TODAY AT THE  ENDOSCOPY CENTER:   Refer to the procedure report that was given to you for any specific questions about what was found during the examination.  If the procedure report does not answer your questions, please call your gastroenterologist to clarify.  If you requested that your care partner not be given the details of your procedure findings, then the procedure report has been included in a sealed envelope for you to review at your convenience later.  YOU SHOULD EXPECT: Some feelings of bloating in the abdomen. Passage of more gas than usual.  Walking can help get rid of the air that was put into your GI tract during the procedure and reduce the bloating. If you had a lower endoscopy (such as a colonoscopy or flexible sigmoidoscopy) you may notice spotting of blood in your stool or on the toilet paper. If you underwent a bowel prep for your procedure, you may not have a normal bowel movement for a few days.  Please Note:  You might notice some irritation and congestion in your nose or some drainage.  This is from the oxygen used during your procedure.  There is no need for concern and it should clear up in a day or so.  SYMPTOMS TO REPORT IMMEDIATELY:   Following lower endoscopy (colonoscopy or flexible sigmoidoscopy):  Excessive amounts of blood in the stool  Significant tenderness or worsening of abdominal pains  Swelling of the abdomen that is new, acute  Fever of 100F or higher For urgent or emergent issues, a gastroenterologist can be reached at any hour by calling [phone].   DIET: Your first meal following the procedure should be a small meal and then it is ok to progress to your normal diet. Heavy or  fried foods are harder to digest and may make you feel nauseous or bloated.  Likewise, meals heavy in dairy and vegetables can increase bloating.  Drink plenty of fluids but you should avoid alcoholic beverages for 24 hours.  ACTIVITY:  You should plan to take it easy for the rest of today and you should NOT DRIVE or use heavy machinery until tomorrow (because of the sedation medicines used during the test).    FOLLOW UP: Our staff will call the number listed on your records the next business day following your procedure to check on you and address any questions or concerns that you may have regarding the information given to you following your procedure. If we do not reach you, we will leave a message.  However, if you are feeling well and you are not experiencing any problems, there is no need to return our call.  We will assume that you have returned to your regular daily activities without incident.  If any biopsies were taken you will be contacted by phone or by letter within the next 1-3 weeks.  Please call us at [phone] if you have not heard about the biopsies in 3 weeks.    SIGNATURES/CONFIDENTIALITY: You and/or your care partner have signed paperwork which will be entered into your electronic medical record.  These signatures attest to the fact that that the information above on your After Visit Summary has been reviewed and is understood.  Full responsibility of  the confidentiality of this discharge information lies with you and/or your care-partner. 

## 2016-02-01 NOTE — Op Note (Signed)
[facility] Patient Name: Billy Hansen Procedure Date: 02/01/2016 11:48 AM MRN: [id number] Endoscopist: Iva Booparl E Dudley Mages , MD Age: [age] Referring MD:  Date of Birth: 1946/03/25 Gender: Male Procedure:                Colonoscopy Indications:              Screening for colorectal malignant neoplasm, Last                            colonoscopy: 2006 Medicines:                Propofol per Anesthesia, Monitored Anesthesia Care Procedure:                Pre-Anesthesia Assessment:                           - Prior to the procedure, a History and Physical                            was performed, and patient medications and                            allergies were reviewed. The patient's tolerance of                            previous anesthesia was also reviewed. The risks                            and benefits of the procedure and the sedation                            options and risks were discussed with the patient.                            All questions were answered, and informed consent                            was obtained. Prior Anticoagulants: The patient                            last took aspirin 1 day prior to the procedure. ASA                            Grade Assessment: II - A patient with mild systemic                            disease. After reviewing the risks and benefits,                            the patient was deemed in satisfactory condition to                            undergo the procedure.  After obtaining informed consent, the colonoscope                            was passed under direct vision. Throughout the                            procedure, the patient's blood pressure, pulse, and                            oxygen saturations were monitored continuously. The                            Model PCF-[id number]) scope was introduced                            through the anus and advanced to the the cecum,                            identified by appendiceal orifice and ileocecal                            valve. The quality of the bowel preparation was                            adequate. The colonoscopy was performed without                            difficulty. The patient tolerated the procedure                            well. The bowel preparation used was Miralax. The                            ileocecal valve, appendiceal orifice, and rectum                            were photographed. Scope In: 11:53:09 AM Scope Out: 12:09:12 PM Scope Withdrawal Time: 0 hours 13 minutes 39 seconds  Total Procedure Duration: 0 hours 16 minutes 3 seconds  Findings:                 The colon (entire examined portion) appeared normal.                           The perianal and digital rectal examinations were                            normal. Pertinent negatives include normal prostate                            (size, shape, and consistency). Complications:            No immediate complications. Estimated blood loss:  None. Estimated Blood Loss:     Estimated blood loss: none. Recommendation:           - Repeat colonoscopy in 10 years for screening                            purposes.                           - Patient has a contact number available for                            emergencies. The signs and symptoms of potential                            delayed complications were discussed with the                            patient. Return to normal activities tomorrow.                            Written discharge instructions were provided to the                            patient.                           - Resume previous diet.                           - Continue present medications. Iva Boop, MD 02/01/2016 12:12:46 PM This report has been signed electronically.

## 2016-02-01 NOTE — Progress Notes (Signed)
A/ox3, pleased with MAC, report to RN 

## 2016-02-02 ENCOUNTER — Telehealth: Payer: Self-pay

## 2016-02-02 NOTE — Telephone Encounter (Signed)
  Follow up Call-  Call back number 02/01/2016  Post procedure Call Back phone  # [phone]  Permission to leave phone message Yes     [employer] "MAILBOX HAS NOT BEEN SET UP YET AND CAN NOT ACCEPT CALLS"

## 2016-04-16 ENCOUNTER — Ambulatory Visit (INDEPENDENT_AMBULATORY_CARE_PROVIDER_SITE_OTHER): Payer: Medicare Other | Admitting: Internal Medicine

## 2016-04-16 ENCOUNTER — Encounter: Payer: Self-pay | Admitting: Internal Medicine

## 2016-04-16 VITALS — BP 110/72 | HR 68 | Ht 69.5 in | Wt 225.0 lb

## 2016-04-16 DIAGNOSIS — N183 Chronic kidney disease, stage 3 unspecified: Secondary | ICD-10-CM

## 2016-04-16 DIAGNOSIS — E1122 Type 2 diabetes mellitus with diabetic chronic kidney disease: Secondary | ICD-10-CM | POA: Diagnosis not present

## 2016-04-16 MED ORDER — GLUCOSE BLOOD VI STRP: ORAL_STRIP | 1 refills | Status: DC

## 2016-04-16 NOTE — Patient Instructions (Signed)
Please continue - Metformin 1000 mg 2x a day - Invokana 300 mg in am  Please return in 3-4 months with your sugar log.   Please stop at the lab.
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
## 2016-04-17 ENCOUNTER — Telehealth: Payer: Self-pay

## 2016-04-17 LAB — FRUCTOSAMINE: Fructosamine: 319 umol/L — ABNORMAL HIGH (ref 0–285)

## 2016-04-17 NOTE — Telephone Encounter (Signed)
Called and spoke with patient about lab results. No questions or concerns at this time.  

## 2016-04-18 ENCOUNTER — Other Ambulatory Visit: Payer: Self-pay | Admitting: Internal Medicine

## 2016-04-18 NOTE — Telephone Encounter (Signed)
Pt needs refills on test strips please call to pharmacy

## 2016-04-18 NOTE — Telephone Encounter (Signed)
Rx sent to pharmacy   

## 2016-07-11 ENCOUNTER — Other Ambulatory Visit: Payer: Self-pay

## 2016-07-11 MED ORDER — CANAGLIFLOZIN 300 MG PO TABS: 300.0000 mg | ORAL_TABLET | Freq: Every day | ORAL | 1 refills | Status: DC

## 2016-07-18 ENCOUNTER — Ambulatory Visit (INDEPENDENT_AMBULATORY_CARE_PROVIDER_SITE_OTHER): Payer: Medicare Other | Admitting: Internal Medicine

## 2016-07-18 ENCOUNTER — Encounter: Payer: Self-pay | Admitting: Internal Medicine

## 2016-07-18 VITALS — BP 120/84 | HR 68 | Ht 69.5 in | Wt 227.0 lb

## 2016-07-18 DIAGNOSIS — E1122 Type 2 diabetes mellitus with diabetic chronic kidney disease: Secondary | ICD-10-CM

## 2016-07-18 DIAGNOSIS — N183 Chronic kidney disease, stage 3 (moderate): Secondary | ICD-10-CM

## 2016-07-18 DIAGNOSIS — Z23 Encounter for immunization: Secondary | ICD-10-CM
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
## 2016-07-18 NOTE — Patient Instructions (Addendum)
Please continue - Metformin 1000 mg 2x a day - Invokana 300 mg in am  Please return in 4 months with your sugar log.   Please stop at the lab.

## 2016-07-20 LAB — FRUCTOSAMINE: FRUCTOSAMINE: 344 umol/L — AB (ref 190–270)

## 2016-07-30 ENCOUNTER — Encounter: Payer: Self-pay | Admitting: Family Medicine

## 2016-07-30 DIAGNOSIS — E119 Type 2 diabetes mellitus without complications: Secondary | ICD-10-CM | POA: Diagnosis not present

## 2016-07-30 DIAGNOSIS — H2513 Age-related nuclear cataract, bilateral: Secondary | ICD-10-CM | POA: Diagnosis not present

## 2016-07-30 DIAGNOSIS — Z0100 Encounter for examination of eyes and vision without abnormal findings: Secondary | ICD-10-CM | POA: Diagnosis not present

## 2016-07-30 LAB — HM DIABETES EYE EXAM

## 2016-09-04 ENCOUNTER — Other Ambulatory Visit (INDEPENDENT_AMBULATORY_CARE_PROVIDER_SITE_OTHER): Payer: Medicare Other

## 2016-09-04 ENCOUNTER — Other Ambulatory Visit (HOSPITAL_COMMUNITY)
Admission: RE | Admit: 2016-09-04 | Discharge: 2016-09-04 | Disposition: A | Discharge status: Home / Self Care | Payer: Medicare Other | Source: Ambulatory Visit | Attending: Family Medicine | Admitting: Family Medicine

## 2016-09-04 DIAGNOSIS — Z113 Encounter for screening for infections with a predominantly sexual mode of transmission: Secondary | ICD-10-CM | POA: Insufficient documentation

## 2016-09-04 DIAGNOSIS — Z0000 Encounter for general adult medical examination without abnormal findings: Secondary | ICD-10-CM

## 2016-09-04 DIAGNOSIS — Z114 Encounter for screening for human immunodeficiency virus [HIV]: Secondary | ICD-10-CM

## 2016-09-04 LAB — POC URINALSYSI DIPSTICK (AUTOMATED)
BILIRUBIN UA: NEGATIVE
Ketones, UA: NEGATIVE
Leukocytes, UA: NEGATIVE
NITRITE UA: NEGATIVE
Protein, UA: NEGATIVE
Spec Grav, UA: <=1.005
UROBILINOGEN UA: 0.2
pH, UA: 5

## 2016-09-04 LAB — HEPATIC FUNCTION PANEL
ALK PHOS: 58 U/L (ref 39–117)
ALT: 13 U/L (ref 0–53)
AST: 11 U/L (ref 0–37)
Albumin: 4.6 g/dL (ref 3.5–5.2)
BILIRUBIN TOTAL: 1.1 mg/dL (ref 0.2–1.2)
Bilirubin, Direct: 0.2 mg/dL (ref 0.0–0.3)
Total Protein: 6.7 g/dL (ref 6.0–8.3)

## 2016-09-04 LAB — CBC WITH DIFFERENTIAL/PLATELET
BASOS ABS: 0 10*3/uL (ref 0.0–0.1)
Basophils Relative: 0.8 % (ref 0.0–3.0)
Eosinophils Absolute: 0.1 10*3/uL (ref 0.0–0.7)
Eosinophils Relative: 1.8 % (ref 0.0–5.0)
HCT: 49.6 % (ref 39.0–52.0)
Hemoglobin: 16.9 g/dL (ref 13.0–17.0)
LYMPHS ABS: 2 10*3/uL (ref 0.7–4.0)
Lymphocytes Relative: 37.8 % (ref 12.0–46.0)
MCHC: 34 g/dL (ref 30.0–36.0)
MCV: 86.3 fl (ref 78.0–100.0)
MONO ABS: 0.4 10*3/uL (ref 0.1–1.0)
Monocytes Relative: 7.6 % (ref 3.0–12.0)
NEUTROS ABS: 2.7 10*3/uL (ref 1.4–7.7)
NEUTROS PCT: 52 % (ref 43.0–77.0)
PLATELETS: 163 10*3/uL (ref 150.0–400.0)
RBC: 5.75 Mil/uL (ref 4.22–5.81)
RDW: 13.8 % (ref 11.5–15.5)
WBC: 5.2 10*3/uL (ref 4.0–10.5)

## 2016-09-04 LAB — BASIC METABOLIC PANEL
BUN: 19 mg/dL (ref 6–23)
CALCIUM: 9.5 mg/dL (ref 8.4–10.5)
CO2: 29 mEq/L (ref 19–32)
Chloride: 105 mEq/L (ref 96–112)
Creatinine, Ser: 1.19 mg/dL (ref 0.40–1.50)
GFR: 77.99 mL/min (ref >60.00)
GLUCOSE: 176 mg/dL — AB (ref 70–99)
Potassium: 4.3 mEq/L (ref 3.5–5.1)
Sodium: 141 mEq/L (ref 135–145)

## 2016-09-04 LAB — LIPID PANEL
CHOLESTEROL: 155 mg/dL (ref 0–200)
HDL: 40.5 mg/dL (ref >39.00)
LDL CALC: 91 mg/dL (ref 0–99)
NonHDL: 114.27
Total CHOL/HDL Ratio: 4
Triglycerides: 116 mg/dL (ref 0.0–149.0)
VLDL: 23.2 mg/dL (ref 0.0–40.0)

## 2016-09-04 LAB — PSA: PSA: 0.62 ng/mL (ref 0.10–4.00)

## 2016-09-04 LAB — MICROALBUMIN / CREATININE URINE RATIO
Creatinine,U: 13 mg/dL
MICROALB/CREAT RATIO: 5.4 mg/g (ref 0.0–30.0)
Microalb, Ur: <0.7 mg/dL (ref 0.0–1.9)

## 2016-09-04 LAB — HEMOGLOBIN A1C: Hgb A1c MFr Bld: 7.7 % — ABNORMAL HIGH (ref 4.6–6.5)

## 2016-09-04 LAB — TSH: TSH: 0.61 u[IU]/mL (ref 0.35–4.50)

## 2016-09-05 LAB — URINE CYTOLOGY ANCILLARY ONLY
CHLAMYDIA, DNA PROBE: NEGATIVE
Neisseria Gonorrhea: NEGATIVE
Trichomonas: NEGATIVE

## 2016-09-05 LAB — RPR

## 2016-09-05 LAB — HIV ANTIBODY (ROUTINE TESTING W REFLEX): HIV: NONREACTIVE

## 2016-09-11 ENCOUNTER — Encounter: Payer: Medicare Other | Admitting: Family Medicine

## 2016-09-13 LAB — URINE CYTOLOGY ANCILLARY ONLY
Bacterial vaginitis: NEGATIVE
Candida vaginitis: NEGATIVE

## 2016-09-25 ENCOUNTER — Encounter: Payer: Self-pay | Admitting: Family Medicine

## 2016-09-25 ENCOUNTER — Ambulatory Visit (INDEPENDENT_AMBULATORY_CARE_PROVIDER_SITE_OTHER): Payer: Medicare Other | Admitting: Family Medicine

## 2016-09-25 ENCOUNTER — Other Ambulatory Visit: Payer: Self-pay | Admitting: Family Medicine

## 2016-09-25 VITALS — BP 122/70 | HR 89 | Temp 98.0°F | Ht 69.0 in | Wt 229.2 lb

## 2016-09-25 DIAGNOSIS — E785 Hyperlipidemia, unspecified: Secondary | ICD-10-CM

## 2016-09-25 DIAGNOSIS — N183 Chronic kidney disease, stage 3 (moderate): Secondary | ICD-10-CM

## 2016-09-25 DIAGNOSIS — E139 Other specified diabetes mellitus without complications: Secondary | ICD-10-CM

## 2016-09-25 DIAGNOSIS — Z0000 Encounter for general adult medical examination without abnormal findings: Secondary | ICD-10-CM

## 2016-09-25 DIAGNOSIS — I10 Essential (primary) hypertension: Secondary | ICD-10-CM | POA: Diagnosis not present

## 2016-09-25 DIAGNOSIS — Z23 Encounter for immunization: Secondary | ICD-10-CM

## 2016-09-25 DIAGNOSIS — E1122 Type 2 diabetes mellitus with diabetic chronic kidney disease: Secondary | ICD-10-CM

## 2016-09-25 DIAGNOSIS — E109 Type 1 diabetes mellitus without complications: Secondary | ICD-10-CM

## 2016-09-25 DIAGNOSIS — E049 Nontoxic goiter, unspecified: Secondary | ICD-10-CM

## 2016-09-25 DIAGNOSIS — N529 Male erectile dysfunction, unspecified: Secondary | ICD-10-CM

## 2016-09-25 MED ORDER — SIMVASTATIN 40 MG PO TABS: 40.0000 mg | ORAL_TABLET | Freq: Every day | ORAL | 4 refills | Status: DC

## 2016-09-25 MED ORDER — METFORMIN HCL 1000 MG PO TABS: 1000.0000 mg | ORAL_TABLET | Freq: Two times a day (BID) | ORAL | 4 refills | Status: DC

## 2016-09-25 MED ORDER — GLIPIZIDE 5 MG PO TABS: 2.5000 mg | ORAL_TABLET | Freq: Every day | ORAL | 4 refills | Status: DC

## 2016-09-25 MED ORDER — SILDENAFIL CITRATE 20 MG PO TABS: 20.0000 mg | ORAL_TABLET | Freq: Every evening | ORAL | 11 refills | Status: DC | PRN

## 2016-09-25 MED ORDER — LISINOPRIL 20 MG PO TABS: 40.0000 mg | ORAL_TABLET | Freq: Every morning | ORAL | 4 refills | Status: DC

## 2016-09-25 MED ORDER — FUROSEMIDE 20 MG PO TABS: ORAL_TABLET | ORAL | 4 refills | Status: DC

## 2016-09-25 NOTE — Patient Instructions (Signed)
Generic Viagra 20 mg........... [facility]..........Marland Kitchen. 1-2 tabs 2 hours prior to sex  Continue other medications  Add glipizide 2.5 mg........ one tablet daily in the morning  Continue daily exercise  Strict carbohydrate free diet  Follow-up in one year sooner if any problems

## 2016-09-25 NOTE — Addendum Note (Signed)
Addended by: Dominic PeaHOLSEY, Ashtin Rosner V. on: 09/25/2016 04:20 PM   Modules accepted: Orders
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
## 2016-11-15 ENCOUNTER — Encounter: Payer: Self-pay | Admitting: Internal Medicine

## 2016-11-15 ENCOUNTER — Ambulatory Visit (INDEPENDENT_AMBULATORY_CARE_PROVIDER_SITE_OTHER): Payer: Medicare Other | Admitting: Internal Medicine

## 2016-11-15 VITALS — BP 132/84 | HR 96 | Ht 70.0 in | Wt 234.0 lb

## 2016-11-15 DIAGNOSIS — E1122 Type 2 diabetes mellitus with diabetic chronic kidney disease: Secondary | ICD-10-CM

## 2016-11-15 DIAGNOSIS — N183 Chronic kidney disease, stage 3 (moderate): Secondary | ICD-10-CM | POA: Diagnosis not present
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
## 2016-11-15 NOTE — Patient Instructions (Addendum)
Please continue - Metformin 1000 mg 2x a day - Invokana 300 mg in am  Stop Glipizide.  Please return in 4 months with your sugar log.   Please stop at the lab.

## 2016-11-19 LAB — FRUCTOSAMINE: FRUCTOSAMINE: 317 umol/L — AB (ref 190–270)

## 2016-11-20 ENCOUNTER — Telehealth: Payer: Self-pay

## 2016-11-20 NOTE — Telephone Encounter (Signed)
Called patient and gave lab results. Patient had no questions or concerns.  

## 2016-11-20 NOTE — Telephone Encounter (Signed)
-----   Message from Carlus Pavlovristina Gherghe, MD sent at 11/19/2016  5:24 PM EST ----- [name], can you please call pt: HbA1c calculated from the fructosamine is better, at 7%.

## 2017-01-24 ENCOUNTER — Other Ambulatory Visit: Payer: Self-pay | Admitting: Internal Medicine

## 2017-03-27 ENCOUNTER — Ambulatory Visit (INDEPENDENT_AMBULATORY_CARE_PROVIDER_SITE_OTHER): Payer: Medicare Other | Admitting: Internal Medicine

## 2017-03-27 ENCOUNTER — Encounter: Payer: Self-pay | Admitting: Internal Medicine

## 2017-03-27 VITALS — BP 120/70 | HR 75 | Wt 226.0 lb

## 2017-03-27 DIAGNOSIS — N183 Chronic kidney disease, stage 3 unspecified: Secondary | ICD-10-CM

## 2017-03-27 DIAGNOSIS — E1122 Type 2 diabetes mellitus with diabetic chronic kidney disease: Secondary | ICD-10-CM | POA: Diagnosis not present
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
## 2017-03-27 NOTE — Patient Instructions (Addendum)
Please continue - Metformin 1000 mg 2x a day - Invokana 300 mg in am  Please return in 4 months with your sugar log.   Please stop at the lab. 

## 2017-03-29 LAB — FRUCTOSAMINE: Fructosamine: 352 umol/L — ABNORMAL HIGH (ref 190–270)

## 2017-04-01 ENCOUNTER — Telehealth: Payer: Self-pay

## 2017-04-01 NOTE — Telephone Encounter (Signed)
Called patient and gave lab results. Patient had no questions or concerns.  

## 2017-04-01 NOTE — Telephone Encounter (Signed)
-----   Message from Billy Pavlovristina Gherghe, MD sent at 03/29/2017  5:46 PM EDT ----- [name], can you please call pt: HbA1c calculated from fructosamine is higher: 7.6%. Please advise him to watch his diet a little more.

## 2017-04-25 ENCOUNTER — Ambulatory Visit (INDEPENDENT_AMBULATORY_CARE_PROVIDER_SITE_OTHER): Payer: Medicare Other | Admitting: Podiatry

## 2017-04-25 ENCOUNTER — Ambulatory Visit (INDEPENDENT_AMBULATORY_CARE_PROVIDER_SITE_OTHER): Payer: Medicare Other

## 2017-04-25 ENCOUNTER — Encounter: Payer: Self-pay | Admitting: Podiatry

## 2017-04-25 VITALS — BP 132/86 | HR 71 | Resp 16

## 2017-04-25 DIAGNOSIS — M779 Enthesopathy, unspecified: Secondary | ICD-10-CM

## 2017-04-25 DIAGNOSIS — L84 Corns and callosities: Secondary | ICD-10-CM | POA: Diagnosis not present

## 2017-04-25 DIAGNOSIS — E119 Type 2 diabetes mellitus without complications: Secondary | ICD-10-CM

## 2017-04-25 DIAGNOSIS — B351 Tinea unguium: Secondary | ICD-10-CM | POA: Diagnosis not present

## 2017-04-25 DIAGNOSIS — M2011 Hallux valgus (acquired), right foot: Secondary | ICD-10-CM | POA: Diagnosis not present

## 2017-04-25 DIAGNOSIS — B353 Tinea pedis: Secondary | ICD-10-CM

## 2017-04-25 MED ORDER — KETOCONAZOLE 2 % EX CREA: TOPICAL_CREAM | Freq: Every day | CUTANEOUS | Status: DC

## 2017-04-25 NOTE — Progress Notes (Signed)
   Subjective:    Patient ID: Billy Hansen, male    DOB: [birth year], 71 y.o.   MRN: [id number]  HPI 71 year old male presents for pain and callus between his toes. States that it has flared up since he started a new jo and is wearing different shoes. Present for a couple months. Located at 1st and 2nd toes of his R foot. Reports pain in the great toe but not in the second.  History of DM with self-reported A1c "around 7."   Review of Systems  All other systems reviewed and are negative.     Objective:   Physical Exam  Vitals:   04/25/17 1129  BP: 132/86  Pulse: 71  Resp: 16    General AA&O x3. Normal mood and affect.  Vascular Dorsalis pedis and posterior tibial pulses 2/4 bilat. Brisk capillary refill to all digits. Pedal hair present.  Neurologic Epicritic sensation grossly intact. SWMF 5/5 bilat. Vibratory sensation intact bilat.  Dermatologic Soft hyperkeratotic lesions to the R medial hallux IPJ, 2nd PIPJ  Xerosis with scaling to the plantar  Interspaces clear of maceration. Nails well groomed and normal in appearance.  Orthopedic: MMT 5/5 in dorsiflexion, plantarflexion, inversion, and eversion. Normal joint ROM without pain or crepitus.   Radiographs: Taken and reviewed. No acute fractures or dislocations. Mild HAV deformity bilat.     Assessment & Plan:  Patient evaluated and treated and all questions answered.  Capsulitis R Hallux -XR reviewed as above. -Injected medial hallux IPJ joint capsule with 0.5 cc marcaine 0.5 % plain and 0.5 cc dexamethasone  Callus R Hallux Medial IPJ, R 2nd PIPJ -R 2nd PIPJ callus without pain. -Dispensed silicone toe sleeve for R hallux. -Will trial padding and injection therapy. If pain persists will consider hallux hemiphalangectomy.  Onychomycosis, Tinea Pedis -Rx Ketoconazole. Instructed on application. -If not satisfied with results will consider laser.  Return in about 1 month (around 05/26/2017).

## 2017-04-26 ENCOUNTER — Telehealth: Payer: Self-pay | Admitting: *Deleted

## 2017-04-26 MED ORDER — KETOCONAZOLE 2 % EX CREA: TOPICAL_CREAM | CUTANEOUS | 2 refills | Status: DC

## 2017-04-26 NOTE — Telephone Encounter (Signed)
[facility]

## 2017-05-24 ENCOUNTER — Ambulatory Visit (INDEPENDENT_AMBULATORY_CARE_PROVIDER_SITE_OTHER): Payer: Medicare Other | Admitting: Podiatry

## 2017-05-24 ENCOUNTER — Encounter: Payer: Self-pay | Admitting: Podiatry

## 2017-05-24 DIAGNOSIS — B353 Tinea pedis: Secondary | ICD-10-CM | POA: Diagnosis not present

## 2017-05-24 DIAGNOSIS — M779 Enthesopathy, unspecified: Secondary | ICD-10-CM | POA: Diagnosis not present

## 2017-05-24 NOTE — Progress Notes (Signed)
   Subjective:    Patient ID: Billy Hansen, male    DOB: [birth year], 71 y.o.   MRN: [id number]  Chief Complaint  Patient presents with  . Toe Pain    Follow up capsulitis hallux right   "It better"  . Skin Problem    Follow up tinea   "The cream he gave me is working"   HPI 71 y.o. male follows up today for the above issues. Reports that the R great toe is feeling better. He has purchased on OTC sleeve for his big toe, states it helps with the rubbing and the pain. States the cream for his athlete's foot is working. No new complaints.  Review of Systems    Objective:   Physical Exam There were no vitals filed for this visit. General AA&O x3. Normal mood and affect.  Vascular Dorsalis pedis and posterior tibial pulses  present 2+ bilaterally  Capillary refill normal to all digits. Pedal hair growth normal.  Neurologic Epicritic sensation grossly present.  Dermatologic No open lesions. Interspaces clear of maceration. Nails elongated and thickened. Still has areas of xerosis with scaling to the plantar foot bilat. Callus lateral R hallux IPJ, medial 2nd PIPJ  Orthopedic: No tenderness to palpation R great toe IPJ.     Assessment & Plan:  Patient was evaluated and treated and all questions answered.  Capsulitis R Hallux, Callus  -Improving. No need for re-injection today. -Discussed hemiphalangectomy in the future if pain persists  Tinea Pedis -Improving. Advised patient to call if he needs a refill.  Return in about 3 months (around 08/23/2017).

## 2017-06-05 ENCOUNTER — Telehealth: Payer: Self-pay | Admitting: Family Medicine

## 2017-06-05 NOTE — Telephone Encounter (Signed)
Pt request refill  sildenafil (REVATIO) 20 MG tablet  Pt states it is too [facility] at [facility] and would like you to send to [facility] pharmacy. [address]

## 2017-06-10 NOTE — Telephone Encounter (Signed)
Please Advise if ok to call in Rx for refill.

## 2017-06-11 ENCOUNTER — Other Ambulatory Visit: Payer: Self-pay

## 2017-06-11 MED ORDER — SILDENAFIL CITRATE 20 MG PO TABS: 20.0000 mg | ORAL_TABLET | Freq: Every evening | ORAL | 10 refills | Status: DC | PRN

## 2017-06-11 NOTE — Telephone Encounter (Signed)
Rx was signed and is ready for pick up at the front desk, Pt is aware

## 2017-06-11 NOTE — Telephone Encounter (Signed)
Billy Hansen please give him a prescription for the generic Viagra 20 mg dispense 30 tabs,,,,,,,, uses directed,,,,,,,, refills 10,,,,,,,,,,,,,,. The prescription so he can take it to [facility]

## 2017-06-11 NOTE — Telephone Encounter (Signed)
Rx has been printed waiting for dr Todd to sign 

## 2017-07-24 ENCOUNTER — Ambulatory Visit (INDEPENDENT_AMBULATORY_CARE_PROVIDER_SITE_OTHER): Payer: Medicare Other | Admitting: Podiatry

## 2017-07-24 ENCOUNTER — Encounter: Payer: Self-pay | Admitting: Podiatry

## 2017-07-24 DIAGNOSIS — B353 Tinea pedis: Secondary | ICD-10-CM

## 2017-07-26 ENCOUNTER — Ambulatory Visit: Payer: Medicare Other | Admitting: Podiatry

## 2017-07-29 ENCOUNTER — Encounter: Payer: Self-pay | Admitting: Internal Medicine

## 2017-07-29 ENCOUNTER — Ambulatory Visit (INDEPENDENT_AMBULATORY_CARE_PROVIDER_SITE_OTHER): Payer: Medicare Other | Admitting: Internal Medicine

## 2017-07-29 VITALS — BP 118/74 | HR 84 | Temp 97.8°F | Wt 230.4 lb

## 2017-07-29 DIAGNOSIS — E1122 Type 2 diabetes mellitus with diabetic chronic kidney disease: Secondary | ICD-10-CM

## 2017-07-29 DIAGNOSIS — E785 Hyperlipidemia, unspecified: Secondary | ICD-10-CM

## 2017-07-29 DIAGNOSIS — N183 Chronic kidney disease, stage 3 unspecified: Secondary | ICD-10-CM

## 2017-07-29 DIAGNOSIS — E669 Obesity, unspecified: Secondary | ICD-10-CM

## 2017-07-29 LAB — POCT GLYCOSYLATED HEMOGLOBIN (HGB A1C): Hemoglobin A1C: 7.3

## 2017-07-29 NOTE — Patient Instructions (Addendum)
Please continue: - Metformin 1000 mg 2x a day - Invokana 300 mg in am  Please ask Dr. Tawanna Coolerodd to check your fructosamine.  Please return in 4 months with your sugar log.
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
## 2017-07-31 DIAGNOSIS — H2513 Age-related nuclear cataract, bilateral: Secondary | ICD-10-CM | POA: Diagnosis not present

## 2017-07-31 DIAGNOSIS — H524 Presbyopia: Secondary | ICD-10-CM | POA: Diagnosis not present

## 2017-07-31 DIAGNOSIS — E119 Type 2 diabetes mellitus without complications: Secondary | ICD-10-CM | POA: Diagnosis not present

## 2017-07-31 DIAGNOSIS — H5203 Hypermetropia, bilateral: Secondary | ICD-10-CM | POA: Diagnosis not present

## 2017-08-01 ENCOUNTER — Telehealth: Payer: Self-pay | Admitting: Internal Medicine

## 2017-08-01 ENCOUNTER — Other Ambulatory Visit: Payer: Self-pay

## 2017-08-01 MED ORDER — CANAGLIFLOZIN 300 MG PO TABS: ORAL_TABLET | ORAL | 1 refills | Status: DC

## 2017-08-01 MED ORDER — GLUCOSE BLOOD VI STRP: ORAL_STRIP | 3 refills | Status: DC

## 2017-08-01 NOTE — Telephone Encounter (Signed)
New Message   *STAT* If patient is at the pharmacy, call can be transferred to refill team.   1. Which medications need to be refilled? (please list name of each medication and dose if known)  INVOKANA 300 mg tablet once daily at breakfast Accu-Check Compact Plus Test Strips and use once daily  2. Which pharmacy/location (including street and city if local pharmacy) is medication to be sent to? [facility], [address], [place], [place]  3. Do they need a 30 day or 90 day supply?  30 day supply

## 2017-08-01 NOTE — Telephone Encounter (Signed)
Please route this to the correct person as I am not sure who is working with the provider at this time

## 2017-08-01 NOTE — Telephone Encounter (Signed)
I have refilled it. Thank you Toni AmendCourtney!

## 2017-08-02 DIAGNOSIS — E669 Obesity, unspecified: Secondary | ICD-10-CM | POA: Insufficient documentation

## 2017-08-11 NOTE — Progress Notes (Signed)
  Subjective:  Patient ID: Billy Hansen, male    DOB: 01-Apr-1946,  MRN: [id number]  Chief Complaint  Patient presents with  . Foot Problem    i am doing good on my right big toe and the shot did help as well as the cap for my big toe and the cream is helping my skin   71 y.o. male returns for the above complaint.  No complaints today.  States that the athlete's foot appears to have gone away  Objective:  There were no vitals filed for this visit. General AA&O x3. Normal mood and affect.  Vascular Pedal pulses palpable.  Neurologic Epicritic sensation grossly intact.  Dermatologic Xerosis without scaling present to the plantar foot bilateral  Orthopedic: No pain to palpation either foot.   Assessment & Plan:  Patient was evaluated and treated and all questions answered.  Tinea Pedis -Improving -No use for continued antifungal application -Advised to use over-the-counter lotion for xerosis  Return if symptoms worsen or fail to improve.

## 2017-08-16 ENCOUNTER — Encounter: Payer: Self-pay | Admitting: Family Medicine

## 2017-08-16 LAB — HM DIABETES EYE EXAM

## 2017-09-23 ENCOUNTER — Other Ambulatory Visit (INDEPENDENT_AMBULATORY_CARE_PROVIDER_SITE_OTHER): Payer: Medicare Other

## 2017-09-23 DIAGNOSIS — Z0000 Encounter for general adult medical examination without abnormal findings: Secondary | ICD-10-CM

## 2017-09-23 DIAGNOSIS — I10 Essential (primary) hypertension: Secondary | ICD-10-CM | POA: Diagnosis not present

## 2017-09-23 DIAGNOSIS — E785 Hyperlipidemia, unspecified: Secondary | ICD-10-CM | POA: Diagnosis not present

## 2017-09-23 DIAGNOSIS — N183 Chronic kidney disease, stage 3 (moderate): Secondary | ICD-10-CM

## 2017-09-23 DIAGNOSIS — E1122 Type 2 diabetes mellitus with diabetic chronic kidney disease: Secondary | ICD-10-CM | POA: Diagnosis not present

## 2017-09-23 LAB — HEPATIC FUNCTION PANEL
ALBUMIN: 4.2 g/dL (ref 3.5–5.2)
ALK PHOS: 60 U/L (ref 39–117)
ALT: 11 U/L (ref 0–53)
AST: 10 U/L (ref 0–37)
Bilirubin, Direct: 0.2 mg/dL (ref 0.0–0.3)
TOTAL PROTEIN: 6.1 g/dL (ref 6.0–8.3)
Total Bilirubin: 0.9 mg/dL (ref 0.2–1.2)

## 2017-09-23 LAB — BASIC METABOLIC PANEL
BUN: 21 mg/dL (ref 6–23)
CALCIUM: 9.1 mg/dL (ref 8.4–10.5)
CO2: 26 meq/L (ref 19–32)
Chloride: 106 mEq/L (ref 96–112)
Creatinine, Ser: 1.14 mg/dL (ref 0.40–1.50)
GFR: 81.7 mL/min (ref >60.00)
GLUCOSE: 162 mg/dL — AB (ref 70–99)
POTASSIUM: 4.3 meq/L (ref 3.5–5.1)
Sodium: 141 mEq/L (ref 135–145)

## 2017-09-23 LAB — CBC WITH DIFFERENTIAL/PLATELET
Basophils Absolute: 0 10*3/uL (ref 0.0–0.1)
Basophils Relative: 0.7 % (ref 0.0–3.0)
EOS PCT: 2.4 % (ref 0.0–5.0)
Eosinophils Absolute: 0.1 10*3/uL (ref 0.0–0.7)
HCT: 46.4 % (ref 39.0–52.0)
Hemoglobin: 15.2 g/dL (ref 13.0–17.0)
LYMPHS ABS: 1.8 10*3/uL (ref 0.7–4.0)
Lymphocytes Relative: 43.2 % (ref 12.0–46.0)
MCHC: 32.8 g/dL (ref 30.0–36.0)
MCV: 89.4 fl (ref 78.0–100.0)
MONO ABS: 0.3 10*3/uL (ref 0.1–1.0)
Monocytes Relative: 7.5 % (ref 3.0–12.0)
NEUTROS ABS: 2 10*3/uL (ref 1.4–7.7)
NEUTROS PCT: 46.2 % (ref 43.0–77.0)
PLATELETS: 142 10*3/uL — AB (ref 150.0–400.0)
RBC: 5.18 Mil/uL (ref 4.22–5.81)
RDW: 13.9 % (ref 11.5–15.5)
WBC: 4.3 10*3/uL (ref 4.0–10.5)

## 2017-09-23 LAB — LIPID PANEL
CHOLESTEROL: 157 mg/dL (ref 0–200)
HDL: 41.2 mg/dL (ref >39.00)
LDL Cholesterol: 95 mg/dL (ref 0–99)
NonHDL: 115.83
TRIGLYCERIDES: 103 mg/dL (ref 0.0–149.0)
Total CHOL/HDL Ratio: 4
VLDL: 20.6 mg/dL (ref 0.0–40.0)

## 2017-09-23 LAB — POC URINALSYSI DIPSTICK (AUTOMATED)
Bilirubin, UA: NEGATIVE
Blood, UA: NEGATIVE
Ketones, UA: NEGATIVE
Leukocytes, UA: NEGATIVE
Nitrite, UA: NEGATIVE
Protein, UA: NEGATIVE
Spec Grav, UA: 1.02 (ref 1.010–1.025)
Urobilinogen, UA: 0.2 E.U./dL
pH, UA: 5.5 (ref 5.0–8.0)

## 2017-09-23 LAB — MICROALBUMIN / CREATININE URINE RATIO
Creatinine,U: 67.8 mg/dL
Microalb Creat Ratio: 2.4 mg/g (ref 0.0–30.0)
Microalb, Ur: 1.7 mg/dL (ref 0.0–1.9)

## 2017-09-23 LAB — PSA: PSA: 0.69 ng/mL (ref 0.10–4.00)

## 2017-09-23 LAB — HEMOGLOBIN A1C: HEMOGLOBIN A1C: 7.6 % — AB (ref 4.6–6.5)

## 2017-09-23 LAB — TSH: TSH: 0.85 u[IU]/mL (ref 0.35–4.50)

## 2017-09-30 ENCOUNTER — Ambulatory Visit (INDEPENDENT_AMBULATORY_CARE_PROVIDER_SITE_OTHER): Payer: Medicare Other | Admitting: Family Medicine

## 2017-09-30 ENCOUNTER — Encounter: Payer: Self-pay | Admitting: Family Medicine

## 2017-09-30 VITALS — BP 110/80 | HR 68 | Temp 98.3°F | Ht 70.0 in | Wt 232.0 lb

## 2017-09-30 DIAGNOSIS — Z0000 Encounter for general adult medical examination without abnormal findings: Secondary | ICD-10-CM | POA: Diagnosis not present

## 2017-09-30 DIAGNOSIS — Z23 Encounter for immunization: Secondary | ICD-10-CM

## 2017-09-30 DIAGNOSIS — E049 Nontoxic goiter, unspecified: Secondary | ICD-10-CM | POA: Diagnosis not present

## 2017-09-30 DIAGNOSIS — E109 Type 1 diabetes mellitus without complications: Secondary | ICD-10-CM | POA: Diagnosis not present

## 2017-09-30 DIAGNOSIS — E669 Obesity, unspecified: Secondary | ICD-10-CM | POA: Diagnosis not present

## 2017-09-30 DIAGNOSIS — E139 Other specified diabetes mellitus without complications: Secondary | ICD-10-CM

## 2017-09-30 DIAGNOSIS — R351 Nocturia: Secondary | ICD-10-CM

## 2017-09-30 DIAGNOSIS — N401 Enlarged prostate with lower urinary tract symptoms: Secondary | ICD-10-CM

## 2017-09-30 DIAGNOSIS — N529 Male erectile dysfunction, unspecified: Secondary | ICD-10-CM

## 2017-09-30 DIAGNOSIS — E1122 Type 2 diabetes mellitus with diabetic chronic kidney disease: Secondary | ICD-10-CM | POA: Diagnosis not present

## 2017-09-30 DIAGNOSIS — E785 Hyperlipidemia, unspecified: Secondary | ICD-10-CM | POA: Diagnosis not present

## 2017-09-30 DIAGNOSIS — I10 Essential (primary) hypertension: Secondary | ICD-10-CM

## 2017-09-30 DIAGNOSIS — N183 Chronic kidney disease, stage 3 (moderate): Secondary | ICD-10-CM

## 2017-09-30 LAB — PSA: PSA: 0.71 ng/mL (ref 0.10–4.00)

## 2017-09-30 LAB — CBC WITH DIFFERENTIAL/PLATELET
BASOS PCT: 0.6 % (ref 0.0–3.0)
Basophils Absolute: 0 10*3/uL (ref 0.0–0.1)
EOS ABS: 0.1 10*3/uL (ref 0.0–0.7)
EOS PCT: 2 % (ref 0.0–5.0)
HCT: 46.8 % (ref 39.0–52.0)
HEMOGLOBIN: 15.4 g/dL (ref 13.0–17.0)
LYMPHS PCT: 41.5 % (ref 12.0–46.0)
Lymphs Abs: 1.8 10*3/uL (ref 0.7–4.0)
MCHC: 32.9 g/dL (ref 30.0–36.0)
MCV: 88.9 fl (ref 78.0–100.0)
MONO ABS: 0.4 10*3/uL (ref 0.1–1.0)
Monocytes Relative: 8.7 % (ref 3.0–12.0)
Neutro Abs: 2.1 10*3/uL (ref 1.4–7.7)
Neutrophils Relative %: 47.2 % (ref 43.0–77.0)
Platelets: 146 10*3/uL — ABNORMAL LOW (ref 150.0–400.0)
RBC: 5.27 Mil/uL (ref 4.22–5.81)
RDW: 14 % (ref 11.5–15.5)
WBC: 4.4 10*3/uL (ref 4.0–10.5)

## 2017-09-30 LAB — POCT URINALYSIS DIPSTICK
Bilirubin, UA: NEGATIVE
Ketones, UA: NEGATIVE
LEUKOCYTES UA: NEGATIVE
Nitrite, UA: NEGATIVE
PH UA: 6 (ref 5.0–8.0)
PROTEIN UA: NEGATIVE
RBC UA: NEGATIVE
Spec Grav, UA: 1.02 (ref 1.010–1.025)
UROBILINOGEN UA: 0.2 U/dL

## 2017-09-30 LAB — BASIC METABOLIC PANEL
BUN: 17 mg/dL (ref 6–23)
CALCIUM: 9.3 mg/dL (ref 8.4–10.5)
CO2: 28 meq/L (ref 19–32)
Chloride: 104 mEq/L (ref 96–112)
Creatinine, Ser: 1.14 mg/dL (ref 0.40–1.50)
GFR: 81.69 mL/min (ref >60.00)
GLUCOSE: 145 mg/dL — AB (ref 70–99)
Potassium: 4.4 mEq/L (ref 3.5–5.1)
Sodium: 139 mEq/L (ref 135–145)

## 2017-09-30 LAB — HEMOGLOBIN A1C: HEMOGLOBIN A1C: 7.7 % — AB (ref 4.6–6.5)

## 2017-09-30 LAB — HEPATIC FUNCTION PANEL
ALT: 13 U/L (ref 0–53)
AST: 11 U/L (ref 0–37)
Albumin: 4.4 g/dL (ref 3.5–5.2)
Alkaline Phosphatase: 64 U/L (ref 39–117)
BILIRUBIN DIRECT: 0.2 mg/dL (ref 0.0–0.3)
BILIRUBIN TOTAL: 0.9 mg/dL (ref 0.2–1.2)
Total Protein: 6.6 g/dL (ref 6.0–8.3)

## 2017-09-30 LAB — T3, FREE: T3, Free: 3.1 pg/mL (ref 2.3–4.2)

## 2017-09-30 LAB — LIPID PANEL
CHOLESTEROL: 148 mg/dL (ref 0–200)
HDL: 38.4 mg/dL — ABNORMAL LOW (ref >39.00)
LDL Cholesterol: 79 mg/dL (ref 0–99)
NONHDL: 109.3
Total CHOL/HDL Ratio: 4
Triglycerides: 153 mg/dL — ABNORMAL HIGH (ref 0.0–149.0)
VLDL: 30.6 mg/dL (ref 0.0–40.0)

## 2017-09-30 LAB — T4, FREE: Free T4: 1.1 ng/dL (ref 0.60–1.60)

## 2017-09-30 LAB — MICROALBUMIN / CREATININE URINE RATIO
Creatinine,U: 56.4 mg/dL
MICROALB UR: 1.8 mg/dL (ref 0.0–1.9)
Microalb Creat Ratio: 3.1 mg/g (ref 0.0–30.0)

## 2017-09-30 LAB — TSH: TSH: 0.64 u[IU]/mL (ref 0.35–4.50)

## 2017-09-30 MED ORDER — SILDENAFIL CITRATE 20 MG PO TABS: 20.0000 mg | ORAL_TABLET | Freq: Every evening | ORAL | 10 refills | Status: DC | PRN

## 2017-09-30 MED ORDER — LISINOPRIL 20 MG PO TABS: 40.0000 mg | ORAL_TABLET | Freq: Every morning | ORAL | 4 refills | Status: DC

## 2017-09-30 MED ORDER — SIMVASTATIN 40 MG PO TABS: 40.0000 mg | ORAL_TABLET | Freq: Every day | ORAL | 4 refills | Status: DC

## 2017-09-30 MED ORDER — FUROSEMIDE 20 MG PO TABS: ORAL_TABLET | ORAL | 4 refills | Status: DC

## 2017-09-30 MED ORDER — METFORMIN HCL 1000 MG PO TABS: 1000.0000 mg | ORAL_TABLET | Freq: Two times a day (BID) | ORAL | 4 refills | Status: DC

## 2017-09-30 NOTE — Patient Instructions (Signed)
Labs today...Marland Kitchen.Marland Kitchen.Marland Kitchen. I will call you the report  We will set you up a scan of your thyroid gland.,,,,,,,,,,,,, make an appointment to see me a week after your scan for follow-up here in the office  Continue exercise program and diet  Call your insurance company to find out where he get the shingles vaccine

## 2017-09-30 NOTE — Progress Notes (Signed)
Billy Hansen is a 72 year old male nonsmoker who comes in today for general physical examination because of a history of the metabolic syndrome...Marland Kitchen.Marland Kitchen.Marland Kitchen. mild obesity, hypertension, diabetes, hyperlipidemia.  His diabetes is managed by endocrinology Dr. Durel Saltshristine G. His last A1c was 7.0%. He's currently on Invokana one tablet daily, metformin 1000 mg twice a day  For hypertension takes Lasix 20 mg daily along with lisinopril 40 mg daily.  He uses Zocor and a baby aspirin for hyperlipidemia  He uses generic Viagra when necessary for ED  He gets routine eye care, dental care, colonoscopy 2017 normal  Vaccinations up-to-date except for seasonal flu shot given today information given on shingles.  14 point review of systems reviewed and otherwise negative  Cognitive function normal he walks daily home health safety reviewed no issues identified, no guns in the house, he does have a healthcare power of attorney and living well  No neuropathy.  BP 110/80 (BP Location: Left Arm, Patient Position: Sitting, Cuff Size: Normal)   Pulse 68   Temp 98.3 F (36.8 C) (Oral)   Ht 5\' 10"  (1.778 m)   Wt 232 lb (105.2 kg)   BMI 33.29 kg/m  General he is well-developed well-nourished male no acute distress examination of the HEENT was negative neck was supple thyroid gland was enlarged. The reviewing his medical history no history of thyroid disease. No family history of thyroid disease.  The left lobe is 3 times normal right lobe feels slightly enlarged. He's asymptomatic. No carotid bruits. Cardiopulmonary exam normal abdominal exam normal except for scars from previous abdominal surgery and his scar left lower quadrant from his colostomy when he had the trauma many years ago to his rectum  Rectal exam normal prostate smooth nonnodular 1+ BPH stool guaiac negative extremities normal skin no peripheral pulses normal  #1 diabetes2......... continue current meds followed by Dr. Durel Saltshristine G  #2 hypertension at  goal......... continue current therapy  #3 hyperlipidemia........... continue current meds check labs  #4 ED............Marland Kitchen. refill generic Viagra  #5 BPH with nocturia.............. check PSA.Marland Kitchen..Marland Kitchen

## 2017-10-01 LAB — HEPATITIS C ANTIBODY
Hepatitis C Ab: NONREACTIVE
SIGNAL TO CUT-OFF: 0.02 (ref <1.00)

## 2017-11-26 ENCOUNTER — Ambulatory Visit (INDEPENDENT_AMBULATORY_CARE_PROVIDER_SITE_OTHER): Payer: Medicare Other | Admitting: Internal Medicine

## 2017-11-26 ENCOUNTER — Encounter: Payer: Self-pay | Admitting: Internal Medicine

## 2017-11-26 VITALS — BP 138/82 | HR 80 | Ht 70.0 in | Wt 233.6 lb

## 2017-11-26 DIAGNOSIS — E1122 Type 2 diabetes mellitus with diabetic chronic kidney disease: Secondary | ICD-10-CM | POA: Diagnosis not present

## 2017-11-26 DIAGNOSIS — E669 Obesity, unspecified: Secondary | ICD-10-CM

## 2017-11-26 DIAGNOSIS — E785 Hyperlipidemia, unspecified: Secondary | ICD-10-CM | POA: Diagnosis not present

## 2017-11-26 DIAGNOSIS — N183 Chronic kidney disease, stage 3 unspecified: Secondary | ICD-10-CM

## 2017-11-26 MED ORDER — CANAGLIFLOZIN 300 MG PO TABS: ORAL_TABLET | ORAL | 3 refills | Status: DC

## 2017-11-26 NOTE — Patient Instructions (Signed)
Please continue - Metformin 1000 mg 2x a day - Invokana 300 mg in am  Please return in 4 months with your sugar log.   Please stop at the lab. 
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
## 2017-11-28 ENCOUNTER — Telehealth: Payer: Self-pay

## 2017-11-28 LAB — FRUCTOSAMINE: FRUCTOSAMINE: 317 umol/L — AB (ref 190–270)

## 2017-11-28 NOTE — Telephone Encounter (Signed)
-----   Message from Billy Pavlovristina Gherghe, MD sent at 11/28/2017  3:17 PM EDT ----- [name], can you please call pt: HbA1c calculated from the fructosamine is better, at 7%.

## 2017-11-28 NOTE — Telephone Encounter (Signed)
Called patient. Gave results. Patient verbalized understanding.    

## 2018-01-27 ENCOUNTER — Other Ambulatory Visit: Payer: Self-pay | Admitting: Internal Medicine

## 2018-03-28 ENCOUNTER — Ambulatory Visit (INDEPENDENT_AMBULATORY_CARE_PROVIDER_SITE_OTHER): Payer: Medicare Other | Admitting: Internal Medicine

## 2018-03-28 ENCOUNTER — Encounter: Payer: Self-pay | Admitting: Internal Medicine

## 2018-03-28 VITALS — BP 120/72 | HR 67 | Ht 70.0 in | Wt 226.0 lb

## 2018-03-28 DIAGNOSIS — N183 Chronic kidney disease, stage 3 (moderate): Secondary | ICD-10-CM

## 2018-03-28 DIAGNOSIS — E1122 Type 2 diabetes mellitus with diabetic chronic kidney disease: Secondary | ICD-10-CM | POA: Diagnosis not present

## 2018-03-28 DIAGNOSIS — E669 Obesity, unspecified: Secondary | ICD-10-CM

## 2018-03-28 DIAGNOSIS — E785 Hyperlipidemia, unspecified: Secondary | ICD-10-CM | POA: Diagnosis not present

## 2018-03-28 LAB — POCT GLYCOSYLATED HEMOGLOBIN (HGB A1C): Hemoglobin A1C: 7.4 % — AB (ref 4.0–5.6)

## 2018-03-28 NOTE — Addendum Note (Signed)
Addended by: Yolande JollyLAWSON, Rudine Rieger on: 03/28/2018 03:58 PM   Modules accepted: Orders

## 2018-03-28 NOTE — Patient Instructions (Signed)
Please continue: - Metformin 1000 mg 2x a day - Invokana 300 mg in am  Please return in 4 months with your sugar log.   
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
## 2018-03-31 ENCOUNTER — Ambulatory Visit (INDEPENDENT_AMBULATORY_CARE_PROVIDER_SITE_OTHER): Payer: Medicare Other | Admitting: Orthopaedic Surgery

## 2018-03-31 ENCOUNTER — Encounter (INDEPENDENT_AMBULATORY_CARE_PROVIDER_SITE_OTHER): Payer: Self-pay | Admitting: Orthopaedic Surgery

## 2018-03-31 ENCOUNTER — Ambulatory Visit (INDEPENDENT_AMBULATORY_CARE_PROVIDER_SITE_OTHER): Payer: Self-pay

## 2018-03-31 DIAGNOSIS — M7541 Impingement syndrome of right shoulder: Secondary | ICD-10-CM

## 2018-03-31 DIAGNOSIS — M25511 Pain in right shoulder: Secondary | ICD-10-CM | POA: Diagnosis not present

## 2018-03-31 MED ORDER — METHYLPREDNISOLONE ACETATE 40 MG/ML IJ SUSP
40.0000 mg | INTRAMUSCULAR | Status: AC | PRN
  Administered 2018-03-31: 40 mg via INTRA_ARTICULAR

## 2018-03-31 MED ORDER — LIDOCAINE HCL 1 % IJ SOLN
3.0000 mL | INTRAMUSCULAR | Status: AC | PRN
  Administered 2018-03-31: 3 mL

## 2018-03-31 NOTE — Progress Notes (Signed)
Office Visit Note   Patient: Billy Hansen           Date of Birth: 01-30-1946           MRN: [id number] Visit Date: 03/31/2018              Requested by: Roderick Pee, MD [id number] [place], [place] [address] PCP: Roderick Pee, MD   Assessment & Plan: Visit Diagnoses:  1. Right shoulder pain, unspecified chronicity   2. Impingement syndrome of right shoulder     Plan: Based on his clinical exam and x-rays I do feel this is more of an impingement syndrome.  His rotator cuff itself feels strong.  I recommended a steroid injection in the subacromial space and explained the risk and benefits of injections to him.  He does wish to proceed with injection today.  He tolerated it well.  We can see him back in 3 months if needed for a second injection which I would always try this first before proceeding with any other intervention or diagnostic study.  He understands this as well.  All questions concerns were answered and addressed.  Follow-Up Instructions: Return in about 3 months (around 07/01/2018).   Orders:  Orders Placed This Encounter  Procedures  . Large Joint Inj  . XR Shoulder Right   No orders of the defined types were placed in this encounter.     Procedures: Large Joint Inj: R subacromial bursa on 03/31/2018 9:10 AM Indications: pain and diagnostic evaluation Details: 22 G 1.5 in needle  Arthrogram: No  Medications: 3 mL lidocaine 1 %; 40 mg methylPREDNISolone acetate 40 MG/ML Outcome: tolerated well, no immediate complications Procedure, treatment alternatives, risks and benefits explained, specific risks discussed. Consent was given by the patient. Immediately prior to procedure a time out was called to verify the correct patient, procedure, equipment, support staff and site/side marked as required. Patient was prepped and draped in the usual sterile fashion.       Clinical Data: No additional findings.   Subjective: Chief Complaint    Patient presents with  . Right Shoulder - Pain  Patient is a very active and young appearing 72 year old gentleman who comes in with right shoulder pain for about 6 months now.  It hurts like a toothache at night.  Reaching overhead causes pain as well.  He injured that shoulder back in 2003 while tossing some trash bags and he caught 1 heavy one.  He strained his shoulder then and he had a steroid injection which gave him great relief.  He said he is been taking some Tylenol but does not help much.  He is a diabetic but reports excellent control.  He denies any neck pain.  He denies any numbness and tingling in his right hand.  HPI  Review of Systems He currently denies any headache, chest pain, shortness of breath, fever, chills, nausea, vomiting.  Objective: Vital Signs: There were no vitals taken for this visit.  Physical Exam He is alert and oriented x3 and in no acute distress Ortho Exam Examination of his right shoulder shows range of motion to be entirely full.  The rotator cuff feels strong.  He does have a lot of pain in the subacromial outlet and the [facility] joint area.  He has a positive Neer and Hawkins sign.  There is slight clicking to his shoulder when I rotate as well.  His liftoff is negative. Specialty Comments:  No specialty comments available.  Imaging: Xr Shoulder Right  Result Date: 03/31/2018 3 views of the right shoulder showed no acute findings.  The shoulder is well located.  There is minimal arthritic changes.    PMFS History: Patient Active Problem List   Diagnosis Date Noted  . Impingement syndrome of right shoulder 03/31/2018  . Obesity (BMI 30.0-34.9) 08/02/2017  . Controlled type 2 diabetes mellitus with stage 3 chronic kidney disease, without long-term current use of insulin (HCC) 10/14/2015  . Enlarged thyroid gland 08/09/2015  . CKD stage 2 due to type 2 diabetes mellitus (HCC) 10/12/2014  . 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 Hyperlipidemia  03/13/2007  . Essential hypertension, benign 03/13/2007  . GERD 03/13/2007   Past Medical History:  Diagnosis Date  . Allergy   . Arthritis   . Diabetes mellitus without complication (HCC)   . Hyperlipidemia   . Hypertension     Family History  Problem Relation Age of Onset  . Cancer Mother   . Colon cancer Neg Hx     Past Surgical History:  Procedure Laterality Date  . COLON SURGERY  2005   due to injury at work per pt.   . COLON SURGERY  2006   revision of ostomy bag  . COLONOSCOPY  2006, 2017  . KNEE SURGERY     right knee   Social History   Occupational History  . Not on file  Tobacco Use  . Smoking status: Never Smoker  . Smokeless tobacco: Never Used  Substance and Sexual Activity  . Alcohol use: No  . Drug use: No  . Sexual activity: Yes    Partners: Female

## 2018-04-25 NOTE — Progress Notes (Addendum)
Subjective:   Billy Hansen is a 72 y.o. male who presents for Medicare Annual/Subsequent preventive examination.  Reports health as good now   DM2  A1c 7.4 03/2018 Remarried recently 5 children 18 grand kids and 9 great grands  Diet BMI 32  Lost 8lb 3 meals a day, supper is light Vegetables and fruits  Chol/hdl 4; hdl 38  bMI 7.4   Exercise Walking 950 steps this am 12000 - stays busy  No tobacco use   Health Maintenance Due  Topic Date Due  . INFLUENZA VACCINE  04/17/2018   PSV 23 at 66; due now and declined today Dr Elvera Lennox and his foot doctor checked his feet   Eye exam 07/2017   Colonoscopy 01/2016 due 2027  Sick x 1 week; after receiving a new anesthetic   Fall; bad fall and fell from upstairs area getting chairs  In 2005; Went back to work     Cardiac Risk Factors include: advanced age (>58men, >75 women);diabetes mellitus;dyslipidemia;family history of premature cardiovascular disease;hypertension;male gender     Objective:    Vitals: BP 96/60   Pulse 76   Ht 5\' 10"  (1.778 m)   Wt 225 lb (102.1 kg)   SpO2 96%   BMI 32.28 kg/m   Body mass index is 32.28 kg/m.  BP  118 78 at home.  Checks his BP as well as his BS at home. Very health conscious and doing well   Filed Weights   04/28/18 0808  Weight: 225 lb (102.1 kg)   Last weight 233 11/2017    Advanced Directives 04/28/2018 01/18/2016 05/17/2015  Does Patient Have a Medical Advance Directive? No No No  Would patient like information on creating a medical advance directive? - - No - patient declined information   Advanced Directive; Reviewed advanced directive and agreed to receipt of information and discussion.  Focused face to face x  20 minutes discussing HCPOA and Living will and reviewed all the questions in the [facility] forms. The patient voices understanding of HCPOA; LW reviewed and information provided on each question. Educated on how to revoke this HCPOA or LW at any time.   Also   discussed life prolonging measures (given a few examples) and where he could choose to initiate or not;  the ability to given the HCPOA power to change his living will or not if he cannot speak for himself; as well as finalizing the will by 2 unrelated witnesses and notary.  Will call for questions and given information on [facility] pastoral department for further assistance.     Tobacco Social History   Tobacco Use  Smoking Status Never Smoker  Smokeless Tobacco Never Used     Counseling given: Yes   Clinical Intake:     Past Medical History:  Diagnosis Date  . Allergy   . Arthritis   . Diabetes mellitus without complication (HCC)   . Hyperlipidemia   . Hypertension    Past Surgical History:  Procedure Laterality Date  . COLON SURGERY  2005   due to injury at work per pt.   . COLON SURGERY  2006   revision of ostomy bag  . COLONOSCOPY  2006, 2017  . KNEE SURGERY     right knee   Family History  Problem Relation Age of Onset  . Cancer Mother   . Colon cancer Neg Hx    Social History   Socioeconomic History  . Marital status: Married    Spouse  name: Not on file  . Number of children: Not on file  . Years of education: Not on file  . Highest education level: Not on file  Occupational History  . Not on file  Social Needs  . Financial resource strain: Not on file  . Food insecurity:    Worry: Not on file    Inability: Not on file  . Transportation needs:    Medical: Not on file    Non-medical: Not on file  Tobacco Use  . Smoking status: Never Smoker  . Smokeless tobacco: Never Used  Substance and Sexual Activity  . Alcohol use: No  . Drug use: No  . Sexual activity: Yes    Partners: Female  Lifestyle  . Physical activity:    Days per week: Not on file    Minutes per session: Not on file  . Stress: Not on file  Relationships  . Social connections:    Talks on phone: Not on file    Gets together: Not on file    Attends religious service: Not on  file    Active member of club or organization: Not on file    Attends meetings of clubs or organizations: Not on file    Relationship status: Not on file  Other Topics Concern  . Not on file  Social History Narrative   Regular exercise: walk   Caffeine use: 1 cup of coffee          Outpatient Encounter Medications as of 04/28/2018  Medication Sig  . aspirin 81 MG tablet Take 81 mg by mouth daily.  . canagliflozin (INVOKANA) 300 MG TABS tablet TAKE ONE TABLET BY MOUTH ONCE DAILY WITH BREAKFAST  . furosemide (LASIX) 20 MG tablet TAKE ONE TABLET BY MOUTH ONCE DAILY **  OFFICE  VISIT  FOR  MORE  REFILLS  **  . glucose blood (ACCU-CHEK COMPACT PLUS) test strip USE ONE STRIP TO CHECK GLUCOSE ONCE DAILY AS DIRECTED  . INVOKANA 300 MG TABS tablet TAKE 1 TABLET BY MOUTH ONCE DAILY WITH BREAKFAST  . ketoconazole (NIZORAL) 2 % cream Apply daily to feet, toenails but not between toes.  Marland Kitchen lisinopril (PRINIVIL,ZESTRIL) 20 MG tablet Take 2 tablets (40 mg total) by mouth every morning.  . metFORMIN (GLUCOPHAGE) 1000 MG tablet Take 1 tablet (1,000 mg total) by mouth 2 (two) times daily with a meal.  . sildenafil (REVATIO) 20 MG tablet Take 1 tablet (20 mg total) by mouth at bedtime as needed. 1 or 2 tablets 2 hours prior to sex  . simvastatin (ZOCOR) 40 MG tablet Take 1 tablet (40 mg total) by mouth at bedtime.   No facility-administered encounter medications on file as of 04/28/2018.     Activities of Daily Living In your present state of health, do you have any difficulty performing the following activities: 04/28/2018  Hearing? N  Vision? N  Difficulty concentrating or making decisions? N  Walking or climbing stairs? N  Dressing or bathing? N  Doing errands, shopping? N  Preparing Food and eating ? N  Using the Toilet? N  In the past six months, have you accidently leaked urine? N  Comment fluid pill makes him go more often  Do you have problems with loss of bowel control? N  Managing your  Medications? N  Managing your Finances? N  Housekeeping or managing your Housekeeping? N  Some recent data might be hidden    Patient Care Team: Roderick Pee, MD as PCP - General  Assessment:   This is a routine wellness examination for Raylon.  Exercise Activities and Dietary recommendations Current Exercise Habits: Home exercise routine, Time (Minutes): 60, Frequency (Times/Week): 5, Weekly Exercise (Minutes/Week): 300, Intensity: Moderate, Exercise limited by: cardiac condition(s);Other - see comments  Goals    . Patient Stated     Staying healthy and making good choices Continue to lose some weight        Fall Risk Fall Risk  04/28/2018 09/25/2016 08/16/2014  Falls in the past year? No No No     Depression Screen PHQ 2/9 Scores 04/28/2018 09/25/2016 08/16/2014  PHQ - 2 Score 0 0 0    Cognitive Function MMSE - Mini Mental State Exam 04/28/2018  Not completed: (No Data)   Ad8 score reviewed for issues:  Issues making decisions:  Less interest in hobbies / activities:  Repeats questions, stories (family complaining):  Trouble using ordinary gadgets (microwave, computer, phone):  Forgets the month or year:   Mismanaging finances:   Remembering appts:  Daily problems with thinking and/or memory: Ad8 score is=0          Immunization History  Administered Date(s) Administered  . Influenza Whole 06/24/2006, 07/07/2007, 08/03/2009  . Influenza, High Dose Seasonal PF 07/18/2016, 09/30/2017  . Influenza,inj,Quad PF,6+ Mos 07/27/2013, 08/16/2014, 07/15/2015  . Pneumococcal Conjugate-13 08/16/2014  . Pneumococcal Polysaccharide-23 10/29/2012  . Td 04/02/2006  . Tdap 09/25/2016      Screening Tests Health Maintenance  Topic Date Due  . INFLUENZA VACCINE  04/17/2018  . PNA vac Low Risk Adult (2 of 2 - PPSV23) 04/27/2019 (Originally 10/29/2017)  . OPHTHALMOLOGY EXAM  08/16/2018  . HEMOGLOBIN A1C  09/28/2018  . FOOT EXAM  03/29/2019  . COLONOSCOPY   01/31/2026  . TETANUS/TDAP  09/25/2026  . Hepatitis C Screening  Completed      Plan:      PCP Notes   Health Maintenance PSV 23 at 46; due now but declined today Dr Elvera Lennox and his foot doctor checked his feet   Eye exam 07/2017   Colonoscopy 01/2016 due 2027  Sick x 1 week; after receiving a new anesthetic   Fall; bad fall and fell from upstairs area getting chairs  In 2005; Went back to work     Abnormal Screens  None Memory intact; good historina  Referrals  none  Patient concerns; May have to have an MRI and fup to right shoulder Dr. Magnus Ivan  Nurse Concerns; As noted  Next PCP apt Scheduled for Jan with Dr. Tawanna Cooler      I have personally reviewed and noted the following in the patient's chart:   . Medical and social history . Use of alcohol, tobacco or illicit drugs  . Current medications and supplements . Functional ability and status . Nutritional status . Physical activity . Advanced directives . List of other physicians . Hospitalizations, surgeries, and ER visits in previous 12 months . Vitals . Screenings to include cognitive, depression, and falls . Referrals and appointments  In addition, I have reviewed and discussed with patient certain preventive protocols, quality metrics, and best practice recommendations. A written personalized care plan for preventive services as well as general preventive health recommendations were provided to patient.    I have reviewed the documentation for AWV and Advanced Care Planning provided by the Health Coach and agree with the documentation. I was immediately available for any questions.    Jamahl Lemmons, RN  04/28/2018   I have reviewed the documentation for the AWV and  Advanced Care Planning provided by the health coach and agree with their documentation. I was immediately available for any questions  Kristian CoveyBruce W Burchette MD  [facility]

## 2018-04-28 ENCOUNTER — Ambulatory Visit (INDEPENDENT_AMBULATORY_CARE_PROVIDER_SITE_OTHER): Payer: Medicare Other

## 2018-04-28 ENCOUNTER — Telehealth: Payer: Self-pay

## 2018-04-28 VITALS — BP 96/60 | HR 76 | Ht 70.0 in | Wt 225.0 lb

## 2018-04-28 DIAGNOSIS — Z0000 Encounter for general adult medical examination without abnormal findings: Secondary | ICD-10-CM

## 2018-04-28 NOTE — Telephone Encounter (Signed)
AWV 08/12 Call to fup regarding shingrix vaccine and educated [name] by phone since this was missed at the AWV.  Montine CircleSusan Braxtyn Bojarski RN

## 2018-04-28 NOTE — Patient Instructions (Addendum)
[name] , Thank you for taking time to come for your Medicare Wellness Visit. I appreciate your ongoing commitment to your health goals. Please review the following plan we discussed and let me know if I can assist you in the future.   The Centers for Disease Control are now recommending 2 pneumonia vaccinations after 65. The first is the Prevnar 13. This helps to boost your immunity to community acquired pneumonia as well as some protection from bacterial pneumonia  The 2nd is the pneumovax 23, which offers more broad protection!  Please consider taking these as this is your best protection against pneumonia. 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 Marvell Hansen's office to report; [facility] Phone: [phone]  These are the goals we discussed: Goals    . Patient Stated     Staying healthy and making good choices Continue to lose some weight        This is a list of the screening recommended for you and due dates:  Health Maintenance  Topic Date Due  . Flu Shot  04/17/2018  . Pneumonia vaccines (2 of 2 - PPSV23) 04/27/2019*  . Eye exam for diabetics  08/16/2018  . Hemoglobin A1C  09/28/2018  . Complete foot exam   03/29/2019  . Colon Cancer Screening  01/31/2026  . Tetanus Vaccine  09/25/2026  .  Hepatitis C: One time screening is recommended by Center for Disease Control  (CDC) for  adults born from [birth year] through 1965.   Completed  *Topic was postponed. The date shown is not the original due date.      Fall Prevention in the Home Falls can cause injuries. They can happen to people of all ages. There are many things you can do to make your home safe and to help prevent falls. What can I do on the outside of my home?  Regularly fix the edges of walkways and driveways and fix any cracks.  Remove anything that  might make you trip as you walk through a door, such as a raised step or threshold.  Trim any bushes or trees on the path to your home.  Use bright outdoor lighting.  Clear any walking paths of anything that might make someone trip, such as rocks or tools.  Regularly check to see if handrails are loose or broken. Make sure that both sides of any steps have handrails.  Any raised decks and porches should have guardrails on the edges.  Have any leaves, snow, or ice cleared regularly.  Use sand or salt on walking paths during winter.  Clean up any spills in your garage right away. This includes oil or grease spills. What can I do in the bathroom?  Use night lights.  Install grab bars by the toilet and in the tub and shower. Do not use towel bars as grab bars.  Use non-skid mats or decals in the tub or shower.  If you need to sit down in the shower, use a plastic, non-slip stool.  Keep the floor dry. Clean up any water that spills on the floor as soon as it happens.  Remove soap buildup in the tub or shower regularly.  Attach bath mats securely with double-sided non-slip rug tape.  Do not have throw rugs and other things on the floor that can make  you trip. What can I do in the bedroom?  Use night lights.  Make sure that you have a light by your bed that is easy to reach.  Do not use any sheets or blankets that are too big for your bed. They should not hang down onto the floor.  Have a firm chair that has side arms. You can use this for support while you get dressed.  Do not have throw rugs and other things on the floor that can make you trip. What can I do in the kitchen?  Clean up any spills right away.  Avoid walking on wet floors.  Keep items that you use a lot in easy-to-reach places.  If you need to reach something above you, use a strong step stool that has a grab bar.  Keep electrical cords out of the way.  Do not use floor polish or wax that makes floors  slippery. If you must use wax, use non-skid floor wax.  Do not have throw rugs and other things on the floor that can make you trip. What can I do with my stairs?  Do not leave any items on the stairs.  Make sure that there are handrails on both sides of the stairs and use them. Fix handrails that are broken or loose. Make sure that handrails are as long as the stairways.  Check any carpeting to make sure that it is firmly attached to the stairs. Fix any carpet that is loose or worn.  Avoid having throw rugs at the top or bottom of the stairs. If you do have throw rugs, attach them to the floor with carpet tape.  Make sure that you have a light switch at the top of the stairs and the bottom of the stairs. If you do not have them, ask someone to add them for you. What else can I do to help prevent falls?  Wear shoes that: ? Do not have high heels. ? Have rubber bottoms. ? Are comfortable and fit you well. ? Are closed at the toe. Do not wear sandals.  If you use a stepladder: ? Make sure that it is fully opened. Do not climb a closed stepladder. ? Make sure that both sides of the stepladder are locked into place. ? Ask someone to hold it for you, if possible.  Clearly mark and make sure that you can see: ? Any grab bars or handrails. ? First and last steps. ? Where the edge of each step is.  Use tools that help you move around (mobility aids) if they are needed. These include: ? Canes. ? Walkers. ? Scooters. ? Crutches.  Turn on the lights when you go into a dark area. Replace any light bulbs as soon as they burn out.  Set up your furniture so you have a clear path. Avoid moving your furniture around.  If any of your floors are uneven, fix them.  If there are any pets around you, be aware of where they are.  Review your medicines with your doctor. Some medicines can make you feel dizzy. This can increase your chance of falling. Ask your doctor what other things that you  can do to help prevent falls. This information is not intended to replace advice given to you by your health care provider. Make sure you discuss any questions you have with your health care provider. Document Released: 06/30/2009 Document Revised: 02/09/2016 Document Reviewed: 10/08/2014 Elsevier Interactive Patient Education  [employer].  Health Maintenance, Male A healthy lifestyle and preventive care is important for your health and wellness. Ask your health care provider about what schedule of regular examinations is right for you. What should I know about weight and diet? Eat a Healthy Diet  Eat plenty of vegetables, fruits, whole grains, low-fat dairy products, and lean protein.  Do not eat a lot of foods high in solid fats, added sugars, or salt.  Maintain a Healthy Weight Regular exercise can help you achieve or maintain a healthy weight. You should:  Do at least 150 minutes of exercise each week. The exercise should increase your heart rate and make you sweat (moderate-intensity exercise).  Do strength-training exercises at least twice a week.  Watch Your Levels of Cholesterol and Blood Lipids  Have your blood tested for lipids and cholesterol every 5 years starting at 70 years of age. If you are at high risk for heart disease, you should start having your blood tested when you are 70 years old. You may need to have your cholesterol levels checked more often if: ? Your lipid or cholesterol levels are high. ? You are older than 70 years of age. ? You are at high risk for heart disease.  What should I know about cancer screening? Many types of cancers can be detected early and may often be prevented. Lung Cancer  You should be screened every year for lung cancer if: ? You are a current smoker who has smoked for at least 30 years. ? You are a former smoker who has quit within the past 15 years.  Talk to your health care provider about your screening options, when  you should start screening, and how often you should be screened.  Colorectal Cancer  Routine colorectal cancer screening usually begins at 70 years of age and should be repeated every 5-10 years until you are 70 years old. You may need to be screened more often if early forms of precancerous polyps or small growths are found. Your health care provider may recommend screening at an earlier age if you have risk factors for colon cancer.  Your health care provider may recommend using home test kits to check for hidden blood in the stool.  A small camera at the end of a tube can be used to examine your colon (sigmoidoscopy or colonoscopy). This checks for the earliest forms of colorectal cancer.  Prostate and Testicular Cancer  Depending on your age and overall health, your health care provider may do certain tests to screen for prostate and testicular cancer.  Talk to your health care provider about any symptoms or concerns you have about testicular or prostate cancer.  Skin Cancer  Check your skin from head to toe regularly.  Tell your health care provider about any new moles or changes in moles, especially if: ? There is a change in a mole's size, shape, or color. ? You have a mole that is larger than a pencil eraser.  Always use sunscreen. Apply sunscreen liberally and repeat throughout the day.  Protect yourself by wearing long sleeves, pants, a wide-brimmed hat, and sunglasses when outside.  What should I know about heart disease, diabetes, and high blood pressure?  If you are 27-57 years of age, have your blood pressure checked every 3-5 years. If you are 43 years of age or older, have your blood pressure checked every year. You should have your blood pressure measured twice-once when you are at a hospital or clinic, and once when  you are not at a hospital or clinic. Record the average of the two measurements. To check your blood pressure when you are not at a hospital or clinic,  you can use: ? An automated blood pressure machine at a pharmacy. ? A home blood pressure monitor.  Talk to your health care provider about your target blood pressure.  If you are between 1345-70 years old, ask your health care provider if you should take aspirin to prevent heart disease.  Have regular diabetes screenings by checking your fasting blood sugar level. ? If you are at a normal weight and have a low risk for diabetes, have this test once every three years after the age of 70. ? If you are overweight and have a high risk for diabetes, consider being tested at a younger age or more often.  A one-time screening for abdominal aortic aneurysm (AAA) by ultrasound is recommended for men aged 65-75 years who are current or former smokers. What should I know about preventing infection? Hepatitis B If you have a higher risk for hepatitis B, you should be screened for this virus. Talk with your health care provider to find out if you are at risk for hepatitis B infection. Hepatitis C Blood testing is recommended for:  Everyone born from [birth year] through 1965.  Anyone with known risk factors for hepatitis C.  Sexually Transmitted Diseases (STDs)  You should be screened each year for STDs including gonorrhea and chlamydia if: ? You are sexually active and are younger than 70 years of age. ? You are older than 70 years of age and your health care provider tells you that you are at risk for this type of infection. ? Your sexual activity has changed since you were last screened and you are at an increased risk for chlamydia or gonorrhea. Ask your health care provider if you are at risk.  Talk with your health care provider about whether you are at high risk of being infected with HIV. Your health care provider may recommend a prescription medicine to help prevent HIV infection.  What else can I do?  Schedule regular health, dental, and eye exams.  Stay current with your vaccines  (immunizations).  Do not use any tobacco products, such as cigarettes, chewing tobacco, and e-cigarettes. If you need help quitting, ask your health care provider.  Limit alcohol intake to no more than 2 drinks per day. One drink equals 12 ounces of beer, 5 ounces of wine, or 1 ounces of hard liquor.  Do not use street drugs.  Do not share needles.  Ask your health care provider for help if you need support or information about quitting drugs.  Tell your health care provider if you often feel depressed.  Tell your health care provider if you have ever been abused or do not feel safe at home. This information is not intended to replace advice given to you by your health care provider. Make sure you discuss any questions you have with your health care provider. Document Released: 03/01/2008 Document Revised: 05/02/2016 Document Reviewed: 06/07/2015 Elsevier Interactive Patient Education  [employer]2018 [employer].

## 2018-06-24 ENCOUNTER — Other Ambulatory Visit: Payer: Self-pay | Admitting: Family Medicine

## 2018-06-30 ENCOUNTER — Telehealth: Payer: Self-pay | Admitting: Family Medicine

## 2018-06-30 NOTE — Telephone Encounter (Signed)
Please call [facility] [address] [phone] not [facility] on [address]

## 2018-06-30 NOTE — Telephone Encounter (Signed)
Copied from CRM [id number]. Topic: Quick Communication - See Telephone Encounter >> Jun 30, 2018  2:50 PM Trula Slade wrote: CRM for notification. See Telephone encounter for: 06/30/18. [facility] on [address] would like to know the instructions for the patient's sildenafil (REVATIO) 20 MG tablet medication.  They received two sets of instructions.

## 2018-06-30 NOTE — Telephone Encounter (Signed)
Pharmacy was given sig. Take 1 to 2 tablets by mouth 2 hrs prior to sex.

## 2018-07-01 ENCOUNTER — Encounter (INDEPENDENT_AMBULATORY_CARE_PROVIDER_SITE_OTHER): Payer: Self-pay | Admitting: Orthopaedic Surgery

## 2018-07-01 ENCOUNTER — Ambulatory Visit (INDEPENDENT_AMBULATORY_CARE_PROVIDER_SITE_OTHER): Payer: Medicare Other | Admitting: Orthopaedic Surgery

## 2018-07-01 VITALS — Ht 70.0 in | Wt 225.0 lb

## 2018-07-01 DIAGNOSIS — M25511 Pain in right shoulder: Secondary | ICD-10-CM

## 2018-07-01 DIAGNOSIS — M7541 Impingement syndrome of right shoulder: Secondary | ICD-10-CM | POA: Diagnosis not present

## 2018-07-01 MED ORDER — LIDOCAINE HCL 1 % IJ SOLN
3.0000 mL | INTRAMUSCULAR | Status: AC | PRN
  Administered 2018-07-01: 3 mL

## 2018-07-01 MED ORDER — METHYLPREDNISOLONE ACETATE 40 MG/ML IJ SUSP
40.0000 mg | INTRAMUSCULAR | Status: AC | PRN
  Administered 2018-07-01: 40 mg via INTRA_ARTICULAR

## 2018-07-01 NOTE — Progress Notes (Signed)
Office Visit Note   Patient: Billy Hansen           Date of Birth: October 01, 1945           MRN: [id number] Visit Date: 07/01/2018              Requested by: Roderick Pee, MD [address] [place], [place] [address] PCP: Roderick Pee, MD   Assessment & Plan: Visit Diagnoses:  1. Right shoulder pain, unspecified chronicity   2. Impingement syndrome of right shoulder     Plan: Due to continued pain and weakness in his right shoulder and the failure of conservative treatment measures I did provide a second steroid injection subacromial space today.  He understands the risk minutes of these injections and will watch his blood glucose closely.  At this point given his continued pain and weakness I would like to obtain an MRI to rule out rotator cuff tear.  Since this is keeping up at night as well I think this is prudent medically necessary at this point.  We will see him back in 2 weeks ago with MRI.  All question concerns were answered and addressed.  Follow-Up Instructions: Return in about 2 weeks (around 07/15/2018).   Orders:  Orders Placed This Encounter  Procedures  . Large Joint Inj   No orders of the defined types were placed in this encounter.     Procedures: Large Joint Inj: R subacromial bursa on 07/01/2018 8:20 AM Indications: pain and diagnostic evaluation Details: 22 G 1.5 in needle  Arthrogram: No  Medications: 3 mL lidocaine 1 %; 40 mg methylPREDNISolone acetate 40 MG/ML Outcome: tolerated well, no immediate complications Procedure, treatment alternatives, risks and benefits explained, specific risks discussed. Consent was given by the patient. Immediately prior to procedure a time out was called to verify the correct patient, procedure, equipment, support staff and site/side marked as required. Patient was prepped and draped in the usual sterile fashion.       Clinical Data: No additional findings.   Subjective: Chief Complaint  Patient  presents with  . Right Shoulder - Pain, Follow-up  The patient comes in for continued evaluation treatment of right shoulder pain.  He started to develop some weakness in the shoulder and is waking up at night.  We did provide an injection in the subacromial space in July of this year.  He said that temporize things for a little while but is back to hurting again.  He is a diabetic but reports good blood glucose control.  He said he is hurting with overhead activities.  The last steroid injection did not increase his blood glucose.  He denies any neck pain and denies any numbness and tingling in his hand.  HPI  Review of Systems He currently denies any headache, chest pain, shortness of breath, fever, chills, nausea, vomiting.  Objective: Vital Signs: Ht 5\' 10"  (1.778 m)   Wt 225 lb (102.1 kg)   BMI 32.28 kg/m   Physical Exam Is alert and oriented x3 and in no acute distress Ortho Exam Examination of his right shoulder does show positive Neer and Hawkins signs of slight weakness of the rotator cuff with abduction.  His range of motion is Specialty Comments:  No specialty comments available.  Imaging: No results found.   PMFS History: Patient Active Problem List   Diagnosis Date Noted  . Impingement syndrome of right shoulder 03/31/2018  . Obesity (BMI 30.0-34.9) 08/02/2017  . Controlled type 2  diabetes mellitus with stage 3 chronic kidney disease, without long-term current use of insulin (HCC) 10/14/2015  . Enlarged thyroid gland 08/09/2015  . CKD stage 2 due to type 2 diabetes mellitus (HCC) 10/12/2014  . 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 Hyperlipidemia 03/13/2007  . Essential hypertension, benign 03/13/2007  . GERD 03/13/2007   Past Medical History:  Diagnosis Date  . Allergy   . Arthritis   . Diabetes mellitus without complication (HCC)   . Hyperlipidemia   . Hypertension     Family History  Problem Relation Age of Onset  . Cancer Mother   . Colon cancer  Neg Hx     Past Surgical History:  Procedure Laterality Date  . COLON SURGERY  2005   due to injury at work per pt.   . COLON SURGERY  2006   revision of ostomy bag  . COLONOSCOPY  2006, 2017  . KNEE SURGERY     right knee   Social History   Occupational History  . Not on file  Tobacco Use  . Smoking status: Never Smoker  . Smokeless tobacco: Never Used  Substance and Sexual Activity  . Alcohol use: No  . Drug use: No  . Sexual activity: Yes    Partners: Female

## 2018-07-15 ENCOUNTER — Encounter (INDEPENDENT_AMBULATORY_CARE_PROVIDER_SITE_OTHER): Payer: Self-pay | Admitting: Orthopaedic Surgery

## 2018-07-15 ENCOUNTER — Ambulatory Visit (INDEPENDENT_AMBULATORY_CARE_PROVIDER_SITE_OTHER): Payer: Medicare Other | Admitting: Orthopaedic Surgery

## 2018-07-15 DIAGNOSIS — M7541 Impingement syndrome of right shoulder: Secondary | ICD-10-CM

## 2018-07-15 NOTE — Progress Notes (Signed)
HPI: [name] returns today 2 weeks status post right shoulder subacromial injection for impingement.  He states he almost canceled the appointment he is feeling so well.  He is has full range of motion of the shoulder.  He denies any pain any numbness tingling down the arm.  Physical exam right shoulder: He has 5 out of 5 strength external and internal rotation against resistance.  Empty can test negative.  Impingement testing on the right is negative.  Plan: Follow-up on an as-needed basis.  No charge for today's office visit.

## 2018-07-30 ENCOUNTER — Ambulatory Visit (INDEPENDENT_AMBULATORY_CARE_PROVIDER_SITE_OTHER): Payer: Medicare Other | Admitting: Internal Medicine

## 2018-07-30 ENCOUNTER — Encounter: Payer: Self-pay | Admitting: Internal Medicine

## 2018-07-30 VITALS — BP 122/80 | HR 84 | Ht 70.0 in | Wt 223.0 lb

## 2018-07-30 DIAGNOSIS — E1122 Type 2 diabetes mellitus with diabetic chronic kidney disease: Secondary | ICD-10-CM | POA: Diagnosis not present

## 2018-07-30 DIAGNOSIS — E785 Hyperlipidemia, unspecified: Secondary | ICD-10-CM | POA: Diagnosis not present

## 2018-07-30 DIAGNOSIS — E139 Other specified diabetes mellitus without complications: Secondary | ICD-10-CM

## 2018-07-30 DIAGNOSIS — N183 Chronic kidney disease, stage 3 (moderate): Secondary | ICD-10-CM

## 2018-07-30 DIAGNOSIS — Z23 Encounter for immunization: Secondary | ICD-10-CM

## 2018-07-30 DIAGNOSIS — E669 Obesity, unspecified: Secondary | ICD-10-CM

## 2018-07-30 LAB — LIPID PANEL
Cholesterol: 169 mg/dL (ref 0–200)
HDL: 47.4 mg/dL (ref >39.00)
LDL CALC: 92 mg/dL (ref 0–99)
NONHDL: 121.51
Total CHOL/HDL Ratio: 4
Triglycerides: 150 mg/dL — ABNORMAL HIGH (ref 0.0–149.0)
VLDL: 30 mg/dL (ref 0.0–40.0)

## 2018-07-30 LAB — MICROALBUMIN / CREATININE URINE RATIO
Creatinine,U: 57.1 mg/dL
MICROALB UR: 2 mg/dL — AB (ref 0.0–1.9)
Microalb Creat Ratio: 3.5 mg/g (ref 0.0–30.0)

## 2018-07-30 MED ORDER — METFORMIN HCL 1000 MG PO TABS: 1000.0000 mg | ORAL_TABLET | Freq: Two times a day (BID) | ORAL | 3 refills | Status: DC

## 2018-07-30 MED ORDER — CANAGLIFLOZIN 300 MG PO TABS: ORAL_TABLET | ORAL | 3 refills | Status: DC

## 2018-07-30 NOTE — Patient Instructions (Addendum)
Please continue: - Metformin 1000 mg 2x a day - Invokana 300 mg in am  Please return in 4 months with your sugar log.
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
## 2018-08-03 LAB — COMPLETE METABOLIC PANEL WITH GFR
AG Ratio: 2 (calc) (ref 1.0–2.5)
ALBUMIN MSPROF: 4.6 g/dL (ref 3.6–5.1)
ALT: 14 U/L (ref 9–46)
AST: 13 U/L (ref 10–35)
Alkaline phosphatase (APISO): 68 U/L (ref 40–115)
BUN / CREAT RATIO: 19 (calc) (ref 6–22)
BUN: 23 mg/dL (ref 7–25)
CHLORIDE: 104 mmol/L (ref 98–110)
CO2: 27 mmol/L (ref 20–32)
CREATININE: 1.19 mg/dL — AB (ref 0.70–1.18)
Calcium: 10.1 mg/dL (ref 8.6–10.3)
GFR, EST AFRICAN AMERICAN: 71 mL/min/{1.73_m2} (ref >=60)
GFR, Est Non African American: 62 mL/min/{1.73_m2} (ref >=60)
GLUCOSE: 132 mg/dL — AB (ref 65–99)
Globulin: 2.3 g/dL (calc) (ref 1.9–3.7)
Potassium: 5 mmol/L (ref 3.5–5.3)
Sodium: 141 mmol/L (ref 135–146)
TOTAL PROTEIN: 6.9 g/dL (ref 6.1–8.1)
Total Bilirubin: 1.3 mg/dL — ABNORMAL HIGH (ref 0.2–1.2)

## 2018-08-03 LAB — FRUCTOSAMINE: FRUCTOSAMINE: 333 umol/L — AB (ref 205–285)

## 2018-08-04 ENCOUNTER — Telehealth: Payer: Self-pay

## 2018-08-04 NOTE — Telephone Encounter (Signed)
Notified patient of message from Dr. Gherghe, patient expressed understanding and agreement. No further questions.  

## 2018-08-04 NOTE — Telephone Encounter (Signed)
-----   Message from Carlus Pavlovristina Gherghe, MD sent at 08/03/2018  6:32 PM EST ----- Efraim KaufmannMelissa, can you please call pt: Labs are stable, with a slightly high glucose, at 132. HbA1c calculated from Fructosamine: 7.27%, slightly higher than before.

## 2018-08-06 ENCOUNTER — Other Ambulatory Visit: Payer: Self-pay | Admitting: Internal Medicine

## 2018-09-19 DIAGNOSIS — E119 Type 2 diabetes mellitus without complications: Secondary | ICD-10-CM | POA: Diagnosis not present

## 2018-09-19 DIAGNOSIS — H2513 Age-related nuclear cataract, bilateral: Secondary | ICD-10-CM | POA: Diagnosis not present

## 2018-09-19 DIAGNOSIS — H5203 Hypermetropia, bilateral: Secondary | ICD-10-CM | POA: Diagnosis not present

## 2018-09-19 LAB — HM DIABETES EYE EXAM

## 2018-09-26 ENCOUNTER — Encounter: Payer: Self-pay | Admitting: Family Medicine

## 2018-10-01 ENCOUNTER — Encounter: Payer: Medicare Other | Admitting: Family Medicine

## 2018-10-15 ENCOUNTER — Encounter (INDEPENDENT_AMBULATORY_CARE_PROVIDER_SITE_OTHER): Payer: Self-pay

## 2018-10-15 ENCOUNTER — Encounter: Payer: Self-pay | Admitting: Physician Assistant

## 2018-10-15 ENCOUNTER — Ambulatory Visit (INDEPENDENT_AMBULATORY_CARE_PROVIDER_SITE_OTHER): Payer: Medicare Other | Admitting: Physician Assistant

## 2018-10-15 VITALS — BP 142/84 | HR 90 | Ht 70.0 in | Wt 232.8 lb

## 2018-10-15 DIAGNOSIS — R1310 Dysphagia, unspecified: Secondary | ICD-10-CM

## 2018-10-15 DIAGNOSIS — K219 Gastro-esophageal reflux disease without esophagitis: Secondary | ICD-10-CM

## 2018-10-15 MED ORDER — OMEPRAZOLE 40 MG PO CPDR: DELAYED_RELEASE_CAPSULE | ORAL | 3 refills | Status: DC

## 2018-10-15 NOTE — Patient Instructions (Addendum)
We sent a prescription for Omeprazole 40 mg to [facility] [place], [place].  Follow a strict antireflux regimen.  Elevate your back at 45 degree angle at night.  We have provided you with antireflux information.   You have been scheduled for an endoscopy. Please follow written instructions given to you at your visit today. If you use inhalers (even only as needed), please bring them with you on the day of your procedure.  Normal BMI (Body Mass Index- based on height and weight) is between 23 and 30. Your BMI today is Body mass index is 33.4 kg/m. Marland Kitchen. Please consider follow up  regarding your BMI with your Primary Care Provider.

## 2018-10-15 NOTE — Progress Notes (Signed)
Subjective:    Patient ID: Billy Hansen, adult    DOB: 09-10-1946, 73 y.o.   MRN: [id number]  HPI Billy Hansen is a pleasant 73 year old white male, known to Billy Hansen from previous colonoscopy, who comes in today with new complaint of dysphasia. Patient had colonoscopy in May 2017 which was a normal exam. Other medical issues include hypertension, adult onset diabetes mellitus and chronic kidney disease. He says that he has had long-term issues with GERD has been on omeprazole 20 mg daily for years.  He says that has worked well to control his reflux symptoms.  Now over the past month or so he has had progressive difficulty with reflux.  He says if he has eaten and then bends over will have reflux of food and sometimes actual regurgitation.  He has also developed dysphagia with every meal.  He is not having any difficulty with liquids but solid foods are feeling as if they are sitting in his esophagus.  Sometimes he may bring something up several hours later.  He denies any odynophagia.  Appetite has  been fine, weight has been stable.  He has not noticed any melena or hematochezia.  Review of Systems Pertinent positive and negative review of systems were noted in the above HPI section.  All other review of systems was otherwise negative.  Outpatient Encounter Medications as of 10/15/2018  Medication Sig  . aspirin 81 MG tablet Take 81 mg by mouth daily.  . canagliflozin (INVOKANA) 300 MG TABS tablet TAKE 1 TABLET BY MOUTH ONCE DAILY WITH BREAKFAST  . furosemide (LASIX) 20 MG tablet TAKE ONE TABLET BY MOUTH ONCE DAILY **  OFFICE  VISIT  FOR  MORE  REFILLS  **  . glucose blood (ACCU-CHEK COMPACT PLUS) test strip USE ONE STRIP TO CHECK GLUCOSE ONCE DAILY AS DIRECTED  . INVOKANA 300 MG TABS tablet TAKE 1 TABLET BY MOUTH ONCE DAILY WITH BREAKFAST  . ketoconazole (NIZORAL) 2 % cream Apply daily to feet, toenails but not between toes.  Marland Kitchen. lisinopril (PRINIVIL,ZESTRIL) 20 MG tablet Take 2 tablets (40  mg total) by mouth every morning.  . metFORMIN (GLUCOPHAGE) 1000 MG tablet Take 1 tablet (1,000 mg total) by mouth 2 (two) times daily with a meal.  . omeprazole (PRILOSEC) 20 MG capsule Take 20 mg by mouth daily.  . sildenafil (REVATIO) 20 MG tablet TAKE 1 TABLET BY MOUTH AT BEDTIME AS NEEDED , TAKE 1 TO 2 TABELTS BY MOUTH 2 HOURS PRIOR TO SEX   . simvastatin (ZOCOR) 40 MG tablet Take 1 tablet (40 mg total) by mouth at bedtime.  Marland Kitchen. omeprazole (PRILOSEC) 40 MG capsule Take 1 tablet by mouth with breakfast every morning.   No facility-administered encounter medications on file as of 10/15/2018.    Allergies  Allergen Reactions  . Januvia [Sitagliptin] Other (See Comments)    HA, fatigue  . Naproxen Sodium Itching  . Other     Some type of anesthetic following colonoscopy/ notes states Propofol   Patient Active Problem List   Diagnosis Date Noted  . Impingement syndrome of right shoulder 03/31/2018  . Obesity (BMI 30.0-34.9) 08/02/2017  . Controlled type 2 diabetes mellitus with stage 3 chronic kidney disease, without long-term current use of insulin (HCC) 10/14/2015  . Enlarged thyroid gland 08/09/2015  . CKD stage 2 due to type 2 diabetes mellitus (HCC) 10/12/2014  . 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 Hyperlipidemia 03/13/2007  . Essential hypertension, benign 03/13/2007  . GERD 03/13/2007  Social History   Socioeconomic History  . Marital status: Married    Spouse name: Not on file  . Number of children: Not on file  . Years of education: Not on file  . Highest education level: Not on file  Occupational History  . Not on file  Social Needs  . Financial resource strain: Not on file  . Food insecurity:    Worry: Not on file    Inability: Not on file  . Transportation needs:    Medical: Not on file    Non-medical: Not on file  Tobacco Use  . Smoking status: Never Smoker  . Smokeless tobacco: Never Used  Substance and Sexual Activity  . Alcohol use: No  . Drug  use: No  . Sexual activity: Yes    Partners: Female  Lifestyle  . Physical activity:    Days per week: Not on file    Minutes per session: Not on file  . Stress: Not on file  Relationships  . Social connections:    Talks on phone: Not on file    Gets together: Not on file    Attends religious service: Not on file    Active member of club or organization: Not on file    Attends meetings of clubs or organizations: Not on file    Relationship status: Not on file  . Intimate partner violence:    Fear of current or ex partner: Not on file    Emotionally abused: Not on file    Physically abused: Not on file    Forced sexual activity: Not on file  Other Topics Concern  . Not on file  Social History Narrative   Regular exercise: walk   Caffeine use: 1 cup of coffee          Billy Hansen family history includes Cancer in her mother.      Objective:    Vitals:   10/15/18 1005  BP: (!) 142/84  Pulse: 90    Physical Exam; well-developed older white male in no acute distress, pleasant, height 5 foot 10, weight 232, BMI 33.4.  HEENT; nontraumatic normocephalic EOMI PERRLA sclera anicteric, oral mucosa moist, Cardiovascular; regular rate and rhythm with S1-S2 no murmur rub or gallop, Pulmonary; clear bilaterally, Abdomen; soft, nontender nondistended bowel sounds are active there is no palpable mass or hepatosplenomegaly.  Rectal ;exam not done, Extremities; no clubbing cyanosis or edema skin warm dry, Neuropsych; alert and oriented, grossly nonfocal mood and affect appropriate       Assessment & Plan:   #71 73 year old white male with chronic GERD, on omeprazole long-term with new onset of dysphasia and worsening reflux symptoms over the past 2 months.  Suspect he has developed a distal esophageal stricture, rule out neoplasm, rule out component of dysmotility.  #2 cancer surveillance-up-to-date with negative colonoscopy May 2017 3.  Adult onset diabetes mellitus 4.   Hypertension 5.  Chronic kidney disease  Plan; Will l increase omeprazole to 40 mg p.o. every morning and have sent prescription. She will be scheduled for upper endoscopy with probable esophageal dilation with Dr. Leone Payor.  Procedure was discussed in detail with the patient including indications risks and benefits and he is agreeable to proceed. In the interim he is advised to avoid larger pieces of meat and bread, cut his food into very small pieces chew carefully and sip fluids in between bites. We also discussed general antireflux precautions including n.p.o. for 2 to 3 hours prior to bedtime and elevation  of the head of the bed and/or his back 45 degrees at nighttime.  Sreshta Cressler S Winter Jocelyn PA-C 10/15/2018   Cc: Roderick Peeodd, Jeffrey A, MD

## 2018-10-23 ENCOUNTER — Ambulatory Visit (AMBULATORY_SURGERY_CENTER): Payer: Medicare Other | Admitting: Internal Medicine

## 2018-10-23 ENCOUNTER — Encounter: Payer: Self-pay | Admitting: Internal Medicine

## 2018-10-23 VITALS — BP 163/89 | HR 69 | Temp 99.1°F | Resp 30 | Ht 70.0 in | Wt 232.0 lb

## 2018-10-23 DIAGNOSIS — R1310 Dysphagia, unspecified: Secondary | ICD-10-CM

## 2018-10-23 DIAGNOSIS — K219 Gastro-esophageal reflux disease without esophagitis: Secondary | ICD-10-CM

## 2018-10-23 MED ORDER — SODIUM CHLORIDE 0.9 % IV SOLN: 500.0000 mL | Freq: Once | INTRAVENOUS | Status: DC

## 2018-10-23 NOTE — Patient Instructions (Addendum)
   I dilated the esophagus - hopefull;y that will fix your swallowing problem (along with the omeprazole)  I appreciate the opportunity to care for you. Iva Boop, MD, [facility]   Post Dilation Diet handout given  YOU HAD AN ENDOSCOPIC PROCEDURE TODAY AT THE [facility]:   Refer to the procedure report that was given to you for any specific questions about what was found during the examination.  If the procedure report does not answer your questions, please call your gastroenterologist to clarify.  If you requested that your care partner not be given the details of your procedure findings, then the procedure report has been included in a sealed envelope for you to review at your convenience later.  YOU SHOULD EXPECT: Some feelings of bloating in the abdomen. Passage of more gas than usual.  Walking can help get rid of the air that was put into your GI tract during the procedure and reduce the bloating. If you had a lower endoscopy (such as a colonoscopy or flexible sigmoidoscopy) you may notice spotting of blood in your stool or on the toilet paper. If you underwent a bowel prep for your procedure, you may not have a normal bowel movement for a few days.  Please Note:  You might notice some irritation and congestion in your nose or some drainage.  This is from the oxygen used during your procedure.  There is no need for concern and it should clear up in a day or so.  SYMPTOMS TO REPORT IMMEDIATELY:    Following upper endoscopy (EGD)  Vomiting of blood or coffee ground material  New chest pain or pain under the shoulder blades  Painful or persistently difficult swallowing  New shortness of breath  Fever of 100F or higher  Black, tarry-looking stools  For urgent or emergent issues, a gastroenterologist can be reached at any hour by calling [phone].   DIET:  We do recommend a small meal at first, but then you may proceed to your regular diet.  Drink plenty of  fluids but you should avoid alcoholic beverages for 24 hours.  ACTIVITY:  You should plan to take it easy for the rest of today and you should NOT DRIVE or use heavy machinery until tomorrow (because of the sedation medicines used during the test).    FOLLOW UP: Our staff will call the number listed on your records the next business day following your procedure to check on you and address any questions or concerns that you may have regarding the information given to you following your procedure. If we do not reach you, we will leave a message.  However, if you are feeling well and you are not experiencing any problems, there is no need to return our call.  We will assume that you have returned to your regular daily activities without incident.  If any biopsies were taken you will be contacted by phone or by letter within the next 1-3 weeks.  Please call us at [phone] if you have not heard about the biopsies in 3 weeks.    SIGNATURES/CONFIDENTIALITY: You and/or your care partner have signed paperwork which will be entered into your electronic medical record.  These signatures attest to the fact that that the information above on your After Visit Summary has been reviewed and is understood.  Full responsibility of the confidentiality of this discharge information lies with you and/or your care-partner.

## 2018-10-23 NOTE — Progress Notes (Signed)
Report given to PACU, vss 

## 2018-10-23 NOTE — Progress Notes (Signed)
Called to room to assist during endoscopic procedure.  Patient ID and intended procedure confirmed with present staff. Received instructions for my participation in the procedure from the performing physician.  

## 2018-10-23 NOTE — Op Note (Signed)
[facility] Patient Name: Billy Hansen Procedure Date: 10/23/2018 3:08 PM MRN: [id number] Endoscopist: Iva Booparl E Gessner , MD Age: [age] Referring MD:  Date of Birth: 1945-09-27 Gender: Male Account #: [id number] Procedure:                Upper GI endoscopy Indications:              Dysphagia Medicines:                Propofol per Anesthesia, Monitored Anesthesia Care Procedure:                Pre-Anesthesia Assessment:                           - Prior to the procedure, a History and Physical                            was performed, and patient medications and                            allergies were reviewed. The patient's tolerance of                            previous anesthesia was also reviewed. The risks                            and benefits of the procedure and the sedation                            options and risks were discussed with the patient.                            All questions were answered, and informed consent                            was obtained. Prior Anticoagulants: The patient has                            taken no previous anticoagulant or antiplatelet                            agents. ASA Grade Assessment: III - A patient with                            severe systemic disease. After reviewing the risks                            and benefits, the patient was deemed in                            satisfactory condition to undergo the procedure.                           After obtaining informed consent, the endoscope was  passed under direct vision. Throughout the                            procedure, the patient's blood pressure, pulse, and                            oxygen saturations were monitored continuously. The                            Model GIF-[id number]) scope was introduced                            through the mouth, and advanced to the second part                            of duodenum. The  upper GI endoscopy was                            accomplished without difficulty. The patient                            tolerated the procedure well. Scope In: Scope Out: Findings:                 No endoscopic abnormality was evident in the                            esophagus to explain the patient's complaint of                            dysphagia. It was decided, however, to proceed with                            dilation of the entire esophagus. The scope was                            withdrawn. Dilation was performed with a Maloney                            dilator with mild resistance at 54 Fr. The dilation                            site was examined following endoscope reinsertion                            and showed no change. Estimated blood loss: none.                           The exam was otherwise without abnormality.                           The cardia and gastric fundus were normal on  retroflexion. Complications:            No immediate complications. Estimated Blood Loss:     Estimated blood loss: none. Impression:               - No endoscopic esophageal abnormality to explain                            patient's dysphagia. Esophagus dilated. Dilated.                           - The examination was otherwise normal.                           - No specimens collected. Recommendation:           - Patient has a contact number available for                            emergencies. The signs and symptoms of potential                            delayed complications were discussed with the                            patient. Return to normal activities tomorrow.                            Written discharge instructions were provided to the                            patient.                           - Clear liquids x 1 hour then soft foods rest of                            day. Start prior diet tomorrow.                           -  Continue present medications.                           - Hopefully this dilation and the PPI Tx will                            alleviate dysphagia                           F/u prn Iva Boop, MD 10/23/2018 3:42:30 PM This report has been signed electronically.

## 2018-10-23 NOTE — Progress Notes (Signed)
1533  Pt problems noted with 10mg  of propofol, pt wants to continue with propofol, vss

## 2018-10-24 ENCOUNTER — Telehealth: Payer: Self-pay

## 2018-10-24 NOTE — Telephone Encounter (Signed)
  Follow up Call-  Call back number 10/23/2018 02/01/2016  Post procedure Call Back phone  # [phone]  Permission to leave phone message Yes Yes  Some recent data might be hidden     Patient questions:  Do you have a fever, pain , or abdominal swelling? No Pain Score  0  Have you tolerated food without any problems? Yes  Have you been able to return to your normal activities? Yes  Do you have any questions about your discharge instructions: Diet   No Medications  No Follow up visit  No  Do you have questions or concerns about your Care? No  Actions: * If pain score is 4 or above:  No action needed, pain <4

## 2018-11-07 ENCOUNTER — Telehealth: Payer: Self-pay | Admitting: Internal Medicine

## 2018-11-07 MED ORDER — GLUCOSE BLOOD VI STRP: ORAL_STRIP | 3 refills | Status: DC

## 2018-11-07 NOTE — Telephone Encounter (Signed)
MEDICATION: Accu Check Test Strips  PHARMACY:  [facility] at [facility] [facility] [employer]  IS THIS A 90 DAY SUPPLY : unknown  IS PATIENT OUT OF MEDICATION: no  IF NOT; HOW MUCH IS LEFT:  1-2 days worth  LAST APPOINTMENT DATE: @11 /20/2019  NEXT APPOINTMENT DATE:@3 /17/2020  DO WE HAVE YOUR PERMISSION TO LEAVE A DETAILED MESSAGE: YES  OTHER COMMENTS:    **Let patient know to contact pharmacy at the end of the day to make sure medication is ready. **  ** Please notify patient to allow 48-72 hours to process**  **Encourage patient to contact the pharmacy for refills or they can request refills through [facility]**

## 2018-11-07 NOTE — Telephone Encounter (Signed)
RX sent

## 2018-12-02 ENCOUNTER — Ambulatory Visit (INDEPENDENT_AMBULATORY_CARE_PROVIDER_SITE_OTHER): Payer: Medicare Other | Admitting: Internal Medicine

## 2018-12-02 ENCOUNTER — Encounter: Payer: Self-pay | Admitting: Internal Medicine

## 2018-12-02 ENCOUNTER — Other Ambulatory Visit: Payer: Self-pay

## 2018-12-02 VITALS — BP 130/86 | HR 90 | Temp 97.8°F | Ht 70.0 in | Wt 228.0 lb

## 2018-12-02 DIAGNOSIS — E1122 Type 2 diabetes mellitus with diabetic chronic kidney disease: Secondary | ICD-10-CM

## 2018-12-02 DIAGNOSIS — E669 Obesity, unspecified: Secondary | ICD-10-CM | POA: Diagnosis not present

## 2018-12-02 DIAGNOSIS — E785 Hyperlipidemia, unspecified: Secondary | ICD-10-CM | POA: Diagnosis not present

## 2018-12-02 DIAGNOSIS — N183 Chronic kidney disease, stage 3 (moderate): Secondary | ICD-10-CM

## 2018-12-02 MED ORDER — CANAGLIFLOZIN 300 MG PO TABS: ORAL_TABLET | ORAL | 3 refills | Status: DC

## 2018-12-02 NOTE — Patient Instructions (Signed)
Please continue: - Metformin 1000 mg 2x a day - Invokana 300 mg in am  Try to eat an earlier dinner.  Please stop at the lab.  Please return in 4 months with your sugar log.
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
## 2018-12-03 ENCOUNTER — Ambulatory Visit (INDEPENDENT_AMBULATORY_CARE_PROVIDER_SITE_OTHER): Payer: Medicare Other | Admitting: Adult Health

## 2018-12-03 ENCOUNTER — Encounter: Payer: Self-pay | Admitting: Adult Health

## 2018-12-03 ENCOUNTER — Other Ambulatory Visit: Payer: Self-pay

## 2018-12-03 VITALS — BP 136/80 | Temp 97.9°F | Wt 227.0 lb

## 2018-12-03 DIAGNOSIS — Z7689 Persons encountering health services in other specified circumstances: Secondary | ICD-10-CM

## 2018-12-03 DIAGNOSIS — N183 Chronic kidney disease, stage 3 (moderate): Secondary | ICD-10-CM | POA: Diagnosis not present

## 2018-12-03 DIAGNOSIS — I10 Essential (primary) hypertension: Secondary | ICD-10-CM | POA: Diagnosis not present

## 2018-12-03 DIAGNOSIS — E1122 Type 2 diabetes mellitus with diabetic chronic kidney disease: Secondary | ICD-10-CM | POA: Diagnosis not present

## 2018-12-03 DIAGNOSIS — E785 Hyperlipidemia, unspecified: Secondary | ICD-10-CM | POA: Diagnosis not present

## 2018-12-03 MED ORDER — FUROSEMIDE 20 MG PO TABS: ORAL_TABLET | ORAL | 3 refills | Status: DC

## 2018-12-03 MED ORDER — LISINOPRIL 20 MG PO TABS: 40.0000 mg | ORAL_TABLET | Freq: Every morning | ORAL | 3 refills | Status: DC

## 2018-12-03 MED ORDER — SIMVASTATIN 40 MG PO TABS: 40.0000 mg | ORAL_TABLET | Freq: Every day | ORAL | 3 refills | Status: DC

## 2018-12-03 NOTE — Progress Notes (Signed)
Patient presents to clinic today to establish care. He is a pleasant 73 year old male who  has a past medical history of Allergy, Arthritis, Diabetes mellitus without complication (HCC), GERD (gastroesophageal reflux disease), Hyperlipidemia, and Hypertension.  Former patient of Dr. Tawanna Cooler who last had a CPE in 09/2017   Acute Concerns: Establish Care  Chronic Issues:  Diabetes mellitus-managed by endocrinology, Dr. Elvera Lennox.  He is currently prescribed Invokana  and metformin 1000 mg twice a day. Lab Results  Component Value Date   HGBA1C 7.4 (A) 03/28/2018    Essential Hypertension - Currently controlled with Lasix 20 mg and lisinopril 40 mg daily.  Denies chest pain, shortness of breath, headaches, dizziness, blurred vision, or syncopal episodes. BP Readings from Last 3 Encounters:  12/03/18 136/80  12/02/18 130/86  10/23/18 (!) 163/89   Hyperlipidemia -takes Zocor and baby aspirin daily Lab Results  Component Value Date   CHOL 169 07/30/2018   HDL 47.40 07/30/2018   LDLCALC 92 07/30/2018   LDLDIRECT 149.4 07/12/2014   TRIG 150.0 (H) 07/30/2018   CHOLHDL 4 07/30/2018   ED- takes Viagra PRN   GERD - controlled with Prilosec.   Health Maintenance: Dental -- Routine Vision --Routine - No diabetic retinopathy  Immunizations -- UTD Colonoscopy -- UTD- last in 2017   Past Medical History:  Diagnosis Date   Allergy    Arthritis    Diabetes mellitus without complication (HCC)    GERD (gastroesophageal reflux disease)    Hyperlipidemia    Hypertension     Past Surgical History:  Procedure Laterality Date   COLON SURGERY  2005   due to injury at work per pt.    COLON SURGERY  2006   revision of ostomy bag   COLONOSCOPY  2006, 2017   KNEE SURGERY     right knee    Current Outpatient Medications on File Prior to Visit  Medication Sig Dispense Refill   aspirin 81 MG tablet Take 81 mg by mouth daily.     canagliflozin (INVOKANA) 300 MG TABS  tablet TAKE 1 TABLET BY MOUTH ONCE DAILY WITH BREAKFAST 90 tablet 3   furosemide (LASIX) 20 MG tablet TAKE ONE TABLET BY MOUTH ONCE DAILY **  OFFICE  VISIT  FOR  MORE  REFILLS  ** 90 tablet 4   glucose blood (ACCU-CHEK COMPACT PLUS) test strip Use to check blood sugar 3 times a day 400 each 3   ketoconazole (NIZORAL) 2 % cream Apply daily to feet, toenails but not between toes. 15 g 2   lisinopril (PRINIVIL,ZESTRIL) 20 MG tablet Take 2 tablets (40 mg total) by mouth every morning. 180 tablet 4   metFORMIN (GLUCOPHAGE) 1000 MG tablet Take 1 tablet (1,000 mg total) by mouth 2 (two) times daily with a meal. 180 tablet 3   omeprazole (PRILOSEC) 40 MG capsule Take 1 tablet by mouth with breakfast every morning. 90 capsule 3   sildenafil (REVATIO) 20 MG tablet TAKE 1 TABLET BY MOUTH AT BEDTIME AS NEEDED , TAKE 1 TO 2 TABELTS BY MOUTH 2 HOURS PRIOR TO SEX  30 tablet 9   simvastatin (ZOCOR) 40 MG tablet Take 1 tablet (40 mg total) by mouth at bedtime. 90 tablet 4   No current facility-administered medications on file prior to visit.     Allergies  Allergen Reactions   Januvia [Sitagliptin] Other (See Comments)    HA, fatigue   Naproxen Sodium Itching   Other     Some type  of anesthetic following colonoscopy/ notes states Propofol    Family History  Problem Relation Age of Onset   Cancer Mother    Diabetes Brother    Colon cancer Neg Hx     Social History   Socioeconomic History   Marital status: Married    Spouse name: Not on file   Number of children: Not on file   Years of education: Not on file   Highest education level: Not on file  Occupational History   Not on file  Social Needs   Financial resource strain: Not on file   Food insecurity:    Worry: Not on file    Inability: Not on file   Transportation needs:    Medical: Not on file    Non-medical: Not on file  Tobacco Use   Smoking status: Never Smoker   Smokeless tobacco: Never Used  Substance  and Sexual Activity   Alcohol use: No   Drug use: No   Sexual activity: Yes    Partners: Female  Lifestyle   Physical activity:    Days per week: Not on file    Minutes per session: Not on file   Stress: Not on file  Relationships   Social connections:    Talks on phone: Not on file    Gets together: Not on file    Attends religious service: Not on file    Active member of club or organization: Not on file    Attends meetings of clubs or organizations: Not on file    Relationship status: Not on file   Intimate partner violence:    Fear of current or ex partner: Not on file    Emotionally abused: Not on file    Physically abused: Not on file    Forced sexual activity: Not on file  Other Topics Concern   Not on file  Social History Narrative   Regular exercise: walk   Caffeine use: 1 cup of coffee          Review of Systems  Constitutional: Negative.   HENT: Negative.   Eyes: Negative.   Respiratory: Negative.   Cardiovascular: Negative.   Genitourinary: Negative.   Musculoskeletal: Negative.   Skin: Negative.   Neurological: Negative.   Endo/Heme/Allergies: Negative.   Psychiatric/Behavioral: Negative.   All other systems reviewed and are negative.   Past Medical History:  Diagnosis Date   Allergy    Arthritis    Diabetes mellitus without complication (HCC)    GERD (gastroesophageal reflux disease)    Hyperlipidemia    Hypertension     Social History   Socioeconomic History   Marital status: Married    Spouse name: Not on file   Number of children: Not on file   Years of education: Not on file   Highest education level: Not on file  Occupational History   Not on file  Social Needs   Financial resource strain: Not on file   Food insecurity:    Worry: Not on file    Inability: Not on file   Transportation needs:    Medical: Not on file    Non-medical: Not on file  Tobacco Use   Smoking status: Never Smoker   Smokeless  tobacco: Never Used  Substance and Sexual Activity   Alcohol use: No   Drug use: No   Sexual activity: Yes    Partners: Female  Lifestyle   Physical activity:    Days per week: Not on file  Minutes per session: Not on file   Stress: Not on file  Relationships   Social connections:    Talks on phone: Not on file    Gets together: Not on file    Attends religious service: Not on file    Active member of club or organization: Not on file    Attends meetings of clubs or organizations: Not on file    Relationship status: Not on file   Intimate partner violence:    Fear of current or ex partner: Not on file    Emotionally abused: Not on file    Physically abused: Not on file    Forced sexual activity: Not on file  Other Topics Concern   Not on file  Social History Narrative   Regular exercise: walk   Caffeine use: 1 cup of coffee          Past Surgical History:  Procedure Laterality Date   COLON SURGERY  2005   due to injury at work per pt.    COLON SURGERY  2006   revision of ostomy bag   COLONOSCOPY  2006, 2017   KNEE SURGERY     right knee    Family History  Problem Relation Age of Onset   Cancer Mother    Diabetes Brother    Colon cancer Neg Hx     Allergies  Allergen Reactions   Januvia [Sitagliptin] Other (See Comments)    HA, fatigue   Naproxen Sodium Itching   Other     Some type of anesthetic following colonoscopy/ notes states Propofol    Current Outpatient Medications on File Prior to Visit  Medication Sig Dispense Refill   aspirin 81 MG tablet Take 81 mg by mouth daily.     canagliflozin (INVOKANA) 300 MG TABS tablet TAKE 1 TABLET BY MOUTH ONCE DAILY WITH BREAKFAST 90 tablet 3   furosemide (LASIX) 20 MG tablet TAKE ONE TABLET BY MOUTH ONCE DAILY **  OFFICE  VISIT  FOR  MORE  REFILLS  ** 90 tablet 4   glucose blood (ACCU-CHEK COMPACT PLUS) test strip Use to check blood sugar 3 times a day 400 each 3   ketoconazole  (NIZORAL) 2 % cream Apply daily to feet, toenails but not between toes. 15 g 2   lisinopril (PRINIVIL,ZESTRIL) 20 MG tablet Take 2 tablets (40 mg total) by mouth every morning. 180 tablet 4   metFORMIN (GLUCOPHAGE) 1000 MG tablet Take 1 tablet (1,000 mg total) by mouth 2 (two) times daily with a meal. 180 tablet 3   omeprazole (PRILOSEC) 40 MG capsule Take 1 tablet by mouth with breakfast every morning. 90 capsule 3   sildenafil (REVATIO) 20 MG tablet TAKE 1 TABLET BY MOUTH AT BEDTIME AS NEEDED , TAKE 1 TO 2 TABELTS BY MOUTH 2 HOURS PRIOR TO SEX  30 tablet 9   simvastatin (ZOCOR) 40 MG tablet Take 1 tablet (40 mg total) by mouth at bedtime. 90 tablet 4   No current facility-administered medications on file prior to visit.     BP 136/80    Temp 97.9 F (36.6 C)    Wt 227 lb (103 kg)    BMI 32.57 kg/m     BP 136/80    Temp 97.9 F (36.6 C)    Wt 227 lb (103 kg)    BMI 32.57 kg/m   Physical Exam Vitals signs and nursing note reviewed.  Constitutional:      General: He is not in acute  distress.    Appearance: Normal appearance. He is well-developed and normal weight. He is not diaphoretic.  HENT:     Head: Normocephalic and atraumatic.     Mouth/Throat:     Mouth: Mucous membranes are moist.     Pharynx: Oropharynx is clear. No oropharyngeal exudate.  Eyes:     General:        Right eye: No discharge.        Left eye: No discharge.     Conjunctiva/sclera: Conjunctivae normal.     Pupils: Pupils are equal, round, and reactive to light.  Neck:     Musculoskeletal: Normal range of motion and neck supple. No neck rigidity or muscular tenderness.     Thyroid: No thyromegaly.     Vascular: No carotid bruit.     Trachea: No tracheal deviation.  Cardiovascular:     Rate and Rhythm: Normal rate and regular rhythm.     Pulses: Normal pulses.     Heart sounds: Normal heart sounds. No murmur. No friction rub. No gallop.   Pulmonary:     Effort: Pulmonary effort is normal. No  respiratory distress.     Breath sounds: Normal breath sounds. No stridor. No wheezing, rhonchi or rales.  Chest:     Chest wall: No tenderness.  Musculoskeletal: Normal range of motion.        General: No swelling, tenderness, deformity or signs of injury.     Right lower leg: No edema.     Left lower leg: No edema.  Lymphadenopathy:     Cervical: No cervical adenopathy.  Skin:    General: Skin is warm and dry.     Capillary Refill: Capillary refill takes less than 2 seconds.  Neurological:     General: No focal deficit present.     Mental Status: He is alert and oriented to person, place, and time. Mental status is at baseline.  Psychiatric:        Mood and Affect: Mood normal.        Behavior: Behavior normal.        Thought Content: Thought content normal.        Judgment: Judgment normal.     Recent Results (from the past 2160 hour(s))  HM DIABETES EYE EXAM     Status: None   Collection Time: 09/19/18 12:00 AM  Result Value Ref Range   HM Diabetic Eye Exam No Retinopathy No Retinopathy    Assessment/Plan: 1. Encounter to establish care - has an appointment for CPE next week  - Encouraged diet and exercise   2. Essential hypertension - No change in medications  - lisinopril (PRINIVIL,ZESTRIL) 20 MG tablet; Take 2 tablets (40 mg total) by mouth every morning.  Dispense: 180 tablet; Refill: 3 - furosemide (LASIX) 20 MG tablet; TAKE ONE TABLET BY MOUTH ONCE DAILY  Dispense: 90 tablet; Refill: 3  3. 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 Elvera Lennox as directed   4. Hyperlipidemia, unspecified hyperlipidemia type  - simvastatin (ZOCOR) 40 MG tablet; Take 1 tablet (40 mg total) by mouth at bedtime.  Dispense: 90 tablet; Refill: 3  [name], NP

## 2018-12-04 LAB — FRUCTOSAMINE: FRUCTOSAMINE: 305 umol/L — AB (ref 205–285)

## 2018-12-10 ENCOUNTER — Encounter: Payer: Medicare Other | Admitting: Adult Health

## 2018-12-15 ENCOUNTER — Encounter: Payer: Self-pay | Admitting: Adult Health

## 2018-12-16 NOTE — Telephone Encounter (Signed)
Appointment has been moved.  Patient confirmed he was coming in for CPX.

## 2018-12-17 ENCOUNTER — Encounter: Payer: Medicare Other | Admitting: Adult Health

## 2019-03-04 ENCOUNTER — Other Ambulatory Visit: Payer: Self-pay

## 2019-03-04 ENCOUNTER — Ambulatory Visit (INDEPENDENT_AMBULATORY_CARE_PROVIDER_SITE_OTHER): Payer: Medicare Other | Admitting: Physician Assistant

## 2019-03-04 ENCOUNTER — Encounter: Payer: Self-pay | Admitting: Physician Assistant

## 2019-03-04 ENCOUNTER — Ambulatory Visit: Payer: Medicare Other | Admitting: Physician Assistant

## 2019-03-04 DIAGNOSIS — M7541 Impingement syndrome of right shoulder: Secondary | ICD-10-CM | POA: Diagnosis not present

## 2019-03-04 MED ORDER — METHYLPREDNISOLONE ACETATE 40 MG/ML IJ SUSP
40.0000 mg | INTRAMUSCULAR | Status: AC | PRN
  Administered 2019-03-04: 15:00:00 40 mg via INTRA_ARTICULAR

## 2019-03-04 MED ORDER — LIDOCAINE HCL 1 % IJ SOLN
3.0000 mL | INTRAMUSCULAR | Status: AC | PRN
  Administered 2019-03-04: 3 mL

## 2019-03-04 NOTE — Progress Notes (Signed)
   Procedure Note  Patient: Billy Hansen             Date of Birth: March 16, 1946           MRN: [id number]             Visit Date: 03/04/2019 HPI: Mr. Mcewen comes in today due to right shoulder pain.  He is requesting cortisone injection in the right shoulder.  He had a right shoulder injection back in October 2019 and did well until recently.  Denies that his pain particularly at night is tooth ache like pain.  Denies any numbness tingling down the arm.  No new injury.  Pain is worse with overhead activity.  Patient is diabetic but reports good control of his diabetes.  Physical exam: Bilateral shoulders 5 5 strength external/internal rotation against resistance empty can test is negative bilaterally.  Positive impingement testing on the right negative on the left. Procedures: Visit Diagnoses:  Right shoulder impingement  Large Joint Inj: R subacromial bursa on 03/04/2019 2:51 PM Indications: pain Details: 22 G 1.5 in needle, lateral approach  Arthrogram: No  Medications: 3 mL lidocaine 1 %; 40 mg methylPREDNISolone acetate 40 MG/ML Outcome: tolerated well, no immediate complications Procedure, treatment alternatives, risks and benefits explained, specific risks discussed. Consent was given by the patient. Immediately prior to procedure a time out was called to verify the correct patient, procedure, equipment, support staff and site/side marked as required. Patient was prepped and draped in the usual sterile fashion.     Plan: I given handouts on shoulder exercises reviewed these with him.  He is also given Thera-Band to perform external and internal exercises for shoulder.  He will follow-up with [place] on as-needed basis pain persist or becomes worse.  Questions encouraged and answered.

## 2019-03-31 ENCOUNTER — Other Ambulatory Visit: Payer: Self-pay

## 2019-04-01 ENCOUNTER — Other Ambulatory Visit: Payer: Self-pay

## 2019-04-01 ENCOUNTER — Encounter: Payer: Self-pay | Admitting: Adult Health

## 2019-04-01 ENCOUNTER — Ambulatory Visit (INDEPENDENT_AMBULATORY_CARE_PROVIDER_SITE_OTHER): Payer: Medicare Other | Admitting: Adult Health

## 2019-04-01 VITALS — BP 118/70 | Temp 98.4°F | Ht 70.0 in | Wt 225.0 lb

## 2019-04-01 DIAGNOSIS — I10 Essential (primary) hypertension: Secondary | ICD-10-CM | POA: Diagnosis not present

## 2019-04-01 DIAGNOSIS — N183 Chronic kidney disease, stage 3 (moderate): Secondary | ICD-10-CM

## 2019-04-01 DIAGNOSIS — Z125 Encounter for screening for malignant neoplasm of prostate: Secondary | ICD-10-CM | POA: Diagnosis not present

## 2019-04-01 DIAGNOSIS — E669 Obesity, unspecified: Secondary | ICD-10-CM

## 2019-04-01 DIAGNOSIS — K219 Gastro-esophageal reflux disease without esophagitis: Secondary | ICD-10-CM

## 2019-04-01 DIAGNOSIS — E1122 Type 2 diabetes mellitus with diabetic chronic kidney disease: Secondary | ICD-10-CM

## 2019-04-01 DIAGNOSIS — N529 Male erectile dysfunction, unspecified: Secondary | ICD-10-CM

## 2019-04-01 DIAGNOSIS — E785 Hyperlipidemia, unspecified: Secondary | ICD-10-CM | POA: Diagnosis not present

## 2019-04-01 DIAGNOSIS — Z0000 Encounter for general adult medical examination without abnormal findings: Secondary | ICD-10-CM

## 2019-04-01 LAB — CBC WITH DIFFERENTIAL/PLATELET
Basophils Absolute: 0 10*3/uL (ref 0.0–0.1)
Basophils Relative: 0.6 % (ref 0.0–3.0)
Eosinophils Absolute: 0.1 10*3/uL (ref 0.0–0.7)
Eosinophils Relative: 2.7 % (ref 0.0–5.0)
HCT: 46.1 % (ref 39.0–52.0)
Hemoglobin: 15.1 g/dL (ref 13.0–17.0)
Lymphocytes Relative: 38 % (ref 12.0–46.0)
Lymphs Abs: 1.9 10*3/uL (ref 0.7–4.0)
MCHC: 32.7 g/dL (ref 30.0–36.0)
MCV: 87.9 fl (ref 78.0–100.0)
Monocytes Absolute: 0.4 10*3/uL (ref 0.1–1.0)
Monocytes Relative: 8 % (ref 3.0–12.0)
Neutro Abs: 2.5 10*3/uL (ref 1.4–7.7)
Neutrophils Relative %: 50.7 % (ref 43.0–77.0)
Platelets: 150 10*3/uL (ref 150.0–400.0)
RBC: 5.24 Mil/uL (ref 4.22–5.81)
RDW: 13.8 % (ref 11.5–15.5)
WBC: 4.9 10*3/uL (ref 4.0–10.5)

## 2019-04-01 LAB — LIPID PANEL
Cholesterol: 160 mg/dL (ref 0–200)
HDL: 41.6 mg/dL (ref >39.00)
LDL Cholesterol: 91 mg/dL (ref 0–99)
NonHDL: 118.76
Total CHOL/HDL Ratio: 4
Triglycerides: 137 mg/dL (ref 0.0–149.0)
VLDL: 27.4 mg/dL (ref 0.0–40.0)

## 2019-04-01 LAB — COMPREHENSIVE METABOLIC PANEL
ALT: 14 U/L (ref 0–53)
AST: 14 U/L (ref 0–37)
Albumin: 4.7 g/dL (ref 3.5–5.2)
Alkaline Phosphatase: 71 U/L (ref 39–117)
BUN: 17 mg/dL (ref 6–23)
CO2: 26 mEq/L (ref 19–32)
Calcium: 9.4 mg/dL (ref 8.4–10.5)
Chloride: 103 mEq/L (ref 96–112)
Creatinine, Ser: 1.05 mg/dL (ref 0.40–1.50)
GFR: 84.15 mL/min (ref >60.00)
Glucose, Bld: 143 mg/dL — ABNORMAL HIGH (ref 70–99)
Potassium: 4.3 mEq/L (ref 3.5–5.1)
Sodium: 139 mEq/L (ref 135–145)
Total Bilirubin: 1.3 mg/dL — ABNORMAL HIGH (ref 0.2–1.2)
Total Protein: 6.7 g/dL (ref 6.0–8.3)

## 2019-04-01 LAB — TSH: TSH: 0.98 u[IU]/mL (ref 0.35–4.50)

## 2019-04-01 LAB — PSA: PSA: 0.9 ng/mL (ref 0.10–4.00)

## 2019-04-01 NOTE — Progress Notes (Signed)
Subjective:    Patient ID: Billy Hansen, male    DOB: Sep 03, 1946, 73 y.o.   MRN: [id number]  HPI Patient presents for yearly preventative medicine examination. He is a pleasant 73 year old male who  has a past medical history of Allergy, Arthritis, Diabetes mellitus without complication ([place]), GERD (gastroesophageal reflux disease), Hyperlipidemia, and Hypertension.   DM -he is managed by endocrinology Dr. Cruzita Lederer.  He was last seen on 12/02/2018.  Is currently prescribed Invokana 300 mg daily and metformin 1000 mg twice a day.  His last A1c was calculated from fructosamine reading of 6.8.  Essential Hypertension -currently controlled with Lasix 20 mg and lisinopril 40 mg daily.  He denies chest pain, shortness of breath, headaches, dizziness, blurred vision, or syncopal episodes BP Readings from Last 3 Encounters:  04/01/19 118/70  12/03/18 136/80  12/02/18 130/86   Hyperlipidemia -he takes Zocor and a daily 81 mg aspirin Lab Results  Component Value Date   CHOL 169 07/30/2018   HDL 47.40 07/30/2018   LDLCALC 92 07/30/2018   LDLDIRECT 149.4 07/12/2014   TRIG 150.0 (H) 07/30/2018   CHOLHDL 4 07/30/2018    ED- Takes Viagra as needed  GERD - Controlled with Prilosec    All immunizations and health maintenance protocols were reviewed with the patient and needed orders were placed. UTD on Vaccinations   Appropriate screening laboratory values were ordered for the patient including screening of hyperlipidemia, renal function and hepatic function. If indicated by BPH, a PSA was ordered.  Medication reconciliation,  past medical history, social history, problem list and allergies were reviewed in detail with the patient  Goals were established with regard to weight loss, exercise, and  diet in compliance with medications. He is staying active with house work and tries to eat healthy but does eat fried food.   Wt Readings from Last 3 Encounters:  04/01/19 225 lb (102.1 kg)    12/03/18 227 lb (103 kg)  12/02/18 228 lb (103.4 kg)    End of life planning was discussed.  He is up-to-date on routine dental and vision screens.  His last colonoscopy was in 2017 and was normal, he will likely not need to do another colonoscopy due to age   Review of Systems  Constitutional: Negative.   HENT: Negative.   Eyes: Negative.   Respiratory: Negative.   Cardiovascular: Negative.   Gastrointestinal: Negative.   Endocrine: Negative.   Genitourinary: Negative.   Musculoskeletal: Negative.   Skin: Negative.   Allergic/Immunologic: Negative.   Neurological: Negative.   Hematological: Negative.   Psychiatric/Behavioral: Negative.   All other systems reviewed and are negative.  Past Medical History:  Diagnosis Date   Allergy    Arthritis    Diabetes mellitus without complication (HCC)    GERD (gastroesophageal reflux disease)    Hyperlipidemia    Hypertension     Social History   Socioeconomic History   Marital status: Married    Spouse name: Not on file   Number of children: Not on file   Years of education: Not on file   Highest education level: Not on file  Occupational History   Not on file  Social Needs   Financial resource strain: Not on file   Food insecurity    Worry: Not on file    Inability: Not on file   Transportation needs    Medical: Not on file    Non-medical: Not on file  Tobacco Use   Smoking status: Never  Smoker   Smokeless tobacco: Never Used  Substance and Sexual Activity   Alcohol use: No   Drug use: No   Sexual activity: Yes    Partners: Female  Lifestyle   Physical activity    Days per week: Not on file    Minutes per session: Not on file   Stress: Not on file  Relationships   Social connections    Talks on phone: Not on file    Gets together: Not on file    Attends religious service: Not on file    Active member of club or organization: Not on file    Attends meetings of clubs or  organizations: Not on file    Relationship status: Not on file   Intimate partner violence    Fear of current or ex partner: Not on file    Emotionally abused: Not on file    Physically abused: Not on file    Forced sexual activity: Not on file  Other Topics Concern   Not on file  Social History Narrative   Regular exercise: walk   Caffeine use: 1 cup of coffee          Past Surgical History:  Procedure Laterality Date   COLON SURGERY  2005   due to injury at work per pt.    COLON SURGERY  2006   revision of ostomy bag   COLONOSCOPY  2006, 2017   KNEE SURGERY     right knee    Family History  Problem Relation Age of Onset   Cancer Mother    Diabetes Brother    Colon cancer Neg Hx     Allergies  Allergen Reactions   Januvia [Sitagliptin] Other (See Comments)    HA, fatigue   Naproxen Sodium Itching   Other     Some type of anesthetic following colonoscopy/ notes states Propofol    Current Outpatient Medications on File Prior to Visit  Medication Sig Dispense Refill   aspirin 81 MG tablet Take 81 mg by mouth daily.     canagliflozin (INVOKANA) 300 MG TABS tablet TAKE 1 TABLET BY MOUTH ONCE DAILY WITH BREAKFAST 90 tablet 3   furosemide (LASIX) 20 MG tablet TAKE ONE TABLET BY MOUTH ONCE DAILY 90 tablet 3   glucose blood (ACCU-CHEK COMPACT PLUS) test strip Use to check blood sugar 3 times a day 400 each 3   ketoconazole (NIZORAL) 2 % cream Apply daily to feet, toenails but not between toes. 15 g 2   lisinopril (PRINIVIL,ZESTRIL) 20 MG tablet Take 2 tablets (40 mg total) by mouth every morning. 180 tablet 3   metFORMIN (GLUCOPHAGE) 1000 MG tablet Take 1 tablet (1,000 mg total) by mouth 2 (two) times daily with a meal. 180 tablet 3   omeprazole (PRILOSEC) 40 MG capsule Take 1 tablet by mouth with breakfast every morning. 90 capsule 3   sildenafil (REVATIO) 20 MG tablet TAKE 1 TABLET BY MOUTH AT BEDTIME AS NEEDED , TAKE 1 TO 2 TABELTS BY MOUTH 2 HOURS  PRIOR TO SEX  30 tablet 9   simvastatin (ZOCOR) 40 MG tablet Take 1 tablet (40 mg total) by mouth at bedtime. 90 tablet 3   No current facility-administered medications on file prior to visit.     BP 118/70    Temp 98.4 F (36.9 C)    Ht 5\' 10"  (1.778 m)    Wt 225 lb (102.1 kg)    BMI 32.28 kg/m  Objective:   Physical Exam Vitals signs and nursing note reviewed.  Constitutional:      General: He is not in acute distress.    Appearance: Normal appearance. He is obese. He is not ill-appearing.  HENT:     Head: Normocephalic and atraumatic.     Right Ear: Tympanic membrane, ear canal and external ear normal. There is no impacted cerumen.     Left Ear: Tympanic membrane, ear canal and external ear normal. There is no impacted cerumen.     Nose: Nose normal. No congestion or rhinorrhea.     Mouth/Throat:     Mouth: Mucous membranes are moist.     Pharynx: Oropharynx is clear.  Eyes:     Extraocular Movements: Extraocular movements intact.     Conjunctiva/sclera: Conjunctivae normal.     Pupils: Pupils are equal, round, and reactive to light.  Cardiovascular:     Rate and Rhythm: Normal rate and regular rhythm.     Pulses: Normal pulses.     Heart sounds: Normal heart sounds. No murmur. No friction rub. No gallop.   Pulmonary:     Effort: Pulmonary effort is normal. No respiratory distress.     Breath sounds: Normal breath sounds. No stridor. No wheezing, rhonchi or rales.  Chest:     Chest wall: No tenderness.  Abdominal:     General: Abdomen is flat. Bowel sounds are normal. There is no distension.     Palpations: Abdomen is soft. There is no mass.     Tenderness: There is no abdominal tenderness. There is no right CVA tenderness, left CVA tenderness, guarding or rebound.     Hernia: No hernia is present.  Musculoskeletal: Normal range of motion.        General: No swelling, tenderness, deformity or signs of injury.     Right lower leg: No edema.     Left lower leg:  No edema.  Skin:    General: Skin is warm and dry.     Capillary Refill: Capillary refill takes less than 2 seconds.     Coloration: Skin is not jaundiced or pale.     Findings: No bruising, erythema, lesion or rash.  Neurological:     General: No focal deficit present.     Mental Status: He is alert and oriented to person, place, and time.  Psychiatric:        Mood and Affect: Mood normal.        Behavior: Behavior normal.        Thought Content: Thought content normal.        Judgment: Judgment normal.       Assessment & Plan:  1. Routine general medical examination at a health care facility - Moderate weight loss - Follow up in one year or sooner if needed - CBC with Differential/Platelet - Comprehensive metabolic panel - Lipid panel - TSH  2. Essential hypertension, benign - Well controlled. No change in medications  - CBC with Differential/Platelet - Comprehensive metabolic panel - Lipid panel - TSH  3. Controlled type 2 diabetes mellitus with stage 3 chronic kidney disease, without long-term current use of insulin (HCC) - Follow up with Endocrinology  - CBC with Differential/Platelet - Comprehensive metabolic panel - Lipid panel - TSH  4. ERECTILE DYSFUNCTION, ORGANIC - Continue Viagra as needed - CBC with Differential/Platelet - Comprehensive metabolic panel - Lipid panel - TSH  5. Hyperlipidemia, unspecified hyperlipidemia type - Consider increase in statin  - CBC with Differential/Platelet -  Comprehensive metabolic panel - Lipid panel - TSH  6. Gastroesophageal reflux disease, esophagitis presence not specified - Continue with PPI  7. Prostate cancer screening  - PSA  8. Obesity (BMI 30.0-34.9) - Encouraged routine exercise and heart healthy diet  - CBC with Differential/Platelet - Comprehensive metabolic panel - Lipid panel - TSH   Shirline Freesory Camar Guyton, NP

## 2019-04-03 ENCOUNTER — Encounter: Payer: Self-pay | Admitting: Internal Medicine

## 2019-04-03 ENCOUNTER — Other Ambulatory Visit: Payer: Self-pay

## 2019-04-03 ENCOUNTER — Ambulatory Visit (INDEPENDENT_AMBULATORY_CARE_PROVIDER_SITE_OTHER): Payer: Medicare Other | Admitting: Internal Medicine

## 2019-04-03 VITALS — BP 132/80 | HR 76 | Ht 70.0 in | Wt 227.0 lb

## 2019-04-03 DIAGNOSIS — N183 Chronic kidney disease, stage 3 unspecified: Secondary | ICD-10-CM

## 2019-04-03 DIAGNOSIS — E1122 Type 2 diabetes mellitus with diabetic chronic kidney disease: Secondary | ICD-10-CM | POA: Diagnosis not present

## 2019-04-03 DIAGNOSIS — E669 Obesity, unspecified: Secondary | ICD-10-CM | POA: Diagnosis not present

## 2019-04-03 DIAGNOSIS — E785 Hyperlipidemia, unspecified: Secondary | ICD-10-CM | POA: Diagnosis not present

## 2019-04-03 LAB — POCT GLYCOSYLATED HEMOGLOBIN (HGB A1C): Hemoglobin A1C: 7.5 % — AB (ref 4.0–5.6)

## 2019-04-03 NOTE — Patient Instructions (Signed)
Please continue: - Metformin 1000 mg 2x a day - Invokana 300 mg in am  Please stop at the lab.  Please return in 4 months with your sugar log.

## 2019-04-03 NOTE — Addendum Note (Signed)
Addended by: Cardell Peach I on: 04/03/2019 09:12 AM   Modules accepted: Orders
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
## 2019-04-09 LAB — FRUCTOSAMINE: Fructosamine: 322 umol/L — ABNORMAL HIGH (ref 205–285)

## 2019-05-04 ENCOUNTER — Ambulatory Visit: Payer: Medicare Other

## 2019-06-02 ENCOUNTER — Telehealth: Payer: Self-pay | Admitting: *Deleted

## 2019-06-02 ENCOUNTER — Telehealth: Payer: Self-pay | Admitting: Adult Health

## 2019-06-02 MED ORDER — SILDENAFIL CITRATE 20 MG PO TABS: ORAL_TABLET | ORAL | 9 refills | Status: DC

## 2019-06-02 NOTE — Telephone Encounter (Signed)
sildenafil (REVATIO) 20 MG tablet  Pt was getting this before Dr Sherren Mocha left and wants a refill, states that the refill needs to be from [facility] because they are cheaper, insurance does not cover... [place]. Phone [phone]. Hoping that Dr C may refill if not reach out to pt at [phone]

## 2019-06-02 NOTE — Telephone Encounter (Signed)
Sent in

## 2019-06-02 NOTE — Telephone Encounter (Signed)
Copied from [place] [id number]. Topic: General - Other >> Jun 02, 2019  2:03 PM Leward Quan A wrote: Reason for CRM: [place] called to get clarification on the Rx sent over for sildenafil (REVATIO) 20 MG tablet say that there are two sets of instruction need to know which one patient should follow. Please advise

## 2019-06-02 NOTE — Telephone Encounter (Signed)
Take 1-2 tablets by mouth prior to sex

## 2019-06-02 NOTE — Telephone Encounter (Signed)
Which directions should be used?

## 2019-06-03 MED ORDER — SILDENAFIL CITRATE 20 MG PO TABS: ORAL_TABLET | ORAL | 9 refills | Status: DC

## 2019-06-03 NOTE — Telephone Encounter (Signed)
New Rx sent with directions.

## 2019-06-17 ENCOUNTER — Telehealth (INDEPENDENT_AMBULATORY_CARE_PROVIDER_SITE_OTHER): Payer: Medicare Other | Admitting: Adult Health

## 2019-06-17 ENCOUNTER — Other Ambulatory Visit: Payer: Self-pay

## 2019-06-17 DIAGNOSIS — M545 Low back pain, unspecified: Secondary | ICD-10-CM

## 2019-06-17 MED ORDER — CYCLOBENZAPRINE HCL 10 MG PO TABS: 10.0000 mg | ORAL_TABLET | Freq: Three times a day (TID) | ORAL | 0 refills | Status: DC | PRN

## 2019-06-17 NOTE — Progress Notes (Signed)
Virtual Visit via Telephone Note  I connected with Charlton Amor on 06/17/19 at  8:30 AM EDT by telephone and verified that I am speaking with the correct person using two identifiers.   I discussed the limitations, risks, security and privacy concerns of performing an evaluation and management service by telephone and the availability of in person appointments. I also discussed with the patient that there may be a patient responsible charge related to this service. The patient expressed understanding and agreed to proceed.  Location patient: home Location provider: work or home office Participants present for the call: patient, provider Patient did not have a visit in the prior 7 days to address this/these issue(s).   History of Present Illness: Being evaluated today for an acute issue of low back pain.  Reports that his low back pain started 3 days ago while at his part-time job.  He denies heavy lifting or aggravating factor.  Pain is described as a "tight sensation.  He has no issues with numbness or tingling down his legs, or issues with bowel or bladder.  Pain is worse when changing positions such as getting out of bed or a chair but once he is up walking around the pain improves.  He has been using Tylenol twice a day which helps "just a little bit" as well as a heating pad which has not made any difference.   Observations/Objective: Patient sounds cheerful and well on the phone. I do not appreciate any SOB. Speech and thought processing are grossly intact. Patient reported vitals:  Assessment and Plan: 1. Acute midline low back pain without sciatica -Appears as muscle strain.  Will send in Flexeril, he was advised that this medication can make him sleepy.  He can continue with Tylenol or Motrin continue with heating pad. - cyclobenzaprine (FLEXERIL) 10 MG tablet; Take 1 tablet (10 mg total) by mouth 3 (three) times daily as needed for up to 15 doses for muscle spasms.  Dispense: 15  tablet; Refill: 0 -Follow-up in 2 to 3 days if no improvement   Follow Up Instructions:   I did not refer this patient for an OV in the next 24 hours for this/these issue(s).  I discussed the assessment and treatment plan with the patient. The patient was provided an opportunity to ask questions and all were answered. The patient agreed with the plan and demonstrated an understanding of the instructions.   The patient was advised to call back or seek an in-person evaluation if the symptoms worsen or if the condition fails to improve as anticipated.  I provided 15 minutes of non-face-to-face time during this encounter.   Dorothyann Peng, NP

## 2019-06-23 ENCOUNTER — Other Ambulatory Visit: Payer: Self-pay

## 2019-06-23 ENCOUNTER — Telehealth (INDEPENDENT_AMBULATORY_CARE_PROVIDER_SITE_OTHER): Payer: Medicare Other | Admitting: Adult Health

## 2019-06-23 DIAGNOSIS — M545 Low back pain, unspecified: Secondary | ICD-10-CM

## 2019-06-23 MED ORDER — METHYLPREDNISOLONE 4 MG PO TBPK: ORAL_TABLET | ORAL | 0 refills | Status: DC

## 2019-06-23 NOTE — Progress Notes (Signed)
Virtual Visit via Telephone Note  I connected with Billy Hansen on 06/23/19 at  2:00 PM EDT by telephone and verified that I am speaking with the correct person using two identifiers.   I discussed the limitations, risks, security and privacy concerns of performing an evaluation and management service by telephone and the availability of in person appointments. I also discussed with the patient that there may be a patient responsible charge related to this service. The patient expressed understanding and agreed to proceed.  Location patient: home Location provider: work or home office Participants present for the call: patient, provider Patient did not have a visit in the prior 7 days to address this/these issue(s).   History of Present Illness: 73 year old male who  has a past medical history of Allergy, Arthritis, Diabetes mellitus without complication ([place]), GERD (gastroesophageal reflux disease), Hyperlipidemia, and Hypertension.  He is following up today from his visit 1 week ago for an acute issue of low back pain.  Pain was described as a "tight sensation".  He denied issues with numbness or tingling down his legs or issues with bowel or bladder.  Pain was worse when changing positions such as getting out of bed or chair but once he was up walking around the pain improved.  He was trialed on Flexeril 10 mg nightly and reports that he had about 50% improvement over the last week, he continues to have a tight sensation when he is changing positions and when bending and twisting.  He continues to deny numbness or tingling sensation in his lower extremities.     Observations/Objective: Patient sounds cheerful and well on the phone. I do not appreciate any SOB. Speech and thought processing are grossly intact. Patient reported vitals:  Assessment and Plan: 1. Acute midline low back pain without sciatica -We will trial him on low-dose Medrol dose pack.  He was advised to drink more  water throughout the day to help keep his blood sugars lower.  We will also order x-ray of lumbar spine if symptoms do not improve within the next 2 days. - methylPREDNISolone (MEDROL DOSEPAK) 4 MG TBPK tablet; Take as directed  Dispense: 21 tablet; Refill: 0 - DG Lumbar Spine Complete; Future   Follow Up Instructions:  I did not refer this patient for an OV in the next 24 hours for this/these issue(s).  I discussed the assessment and treatment plan with the patient. The patient was provided an opportunity to ask questions and all were answered. The patient agreed with the plan and demonstrated an understanding of the instructions.   The patient was advised to call back or seek an in-person evaluation if the symptoms worsen or if the condition fails to improve as anticipated.  I provided 18 minutes of non-face-to-face time during this encounter.   Dorothyann Peng, NP

## 2019-08-03 ENCOUNTER — Other Ambulatory Visit: Payer: Self-pay

## 2019-08-05 ENCOUNTER — Ambulatory Visit (INDEPENDENT_AMBULATORY_CARE_PROVIDER_SITE_OTHER): Payer: Medicare Other | Admitting: Internal Medicine

## 2019-08-05 ENCOUNTER — Encounter: Payer: Self-pay | Admitting: Internal Medicine

## 2019-08-05 ENCOUNTER — Other Ambulatory Visit: Payer: Self-pay

## 2019-08-05 VITALS — BP 118/80 | HR 80 | Ht 70.0 in | Wt 230.0 lb

## 2019-08-05 DIAGNOSIS — E785 Hyperlipidemia, unspecified: Secondary | ICD-10-CM | POA: Diagnosis not present

## 2019-08-05 DIAGNOSIS — Z23 Encounter for immunization: Secondary | ICD-10-CM

## 2019-08-05 DIAGNOSIS — E669 Obesity, unspecified: Secondary | ICD-10-CM | POA: Diagnosis not present

## 2019-08-05 DIAGNOSIS — E1121 Type 2 diabetes mellitus with diabetic nephropathy: Secondary | ICD-10-CM

## 2019-08-05 LAB — POCT GLYCOSYLATED HEMOGLOBIN (HGB A1C): Hemoglobin A1C: 7.8 % — AB (ref 4.0–5.6)

## 2019-08-05 MED ORDER — METFORMIN HCL 1000 MG PO TABS: 1000.0000 mg | ORAL_TABLET | Freq: Two times a day (BID) | ORAL | 3 refills | Status: DC

## 2019-08-05 MED ORDER — CANAGLIFLOZIN 300 MG PO TABS: ORAL_TABLET | ORAL | 3 refills | Status: DC

## 2019-08-05 MED ORDER — ACCU-CHEK COMPACT PLUS VI STRP: ORAL_STRIP | 3 refills | Status: DC

## 2019-08-05 NOTE — Patient Instructions (Addendum)
Please continue: - Invokana 300 mg in am  Please try to move: - Metformin 2000 mg with dinner  Please stop at the lab.  Please return in 4 months with your sugar log.

## 2019-08-05 NOTE — Addendum Note (Signed)
Addended by: Cardell Peach I on: 08/05/2019 09:39 AM   Modules accepted: Orders
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
## 2019-08-10 LAB — FRUCTOSAMINE: Fructosamine: 315 umol/L — ABNORMAL HIGH (ref 205–285)

## 2019-09-14 ENCOUNTER — Other Ambulatory Visit: Payer: Self-pay

## 2019-09-15 ENCOUNTER — Ambulatory Visit (INDEPENDENT_AMBULATORY_CARE_PROVIDER_SITE_OTHER): Payer: Medicare Other | Admitting: Family Medicine

## 2019-09-15 ENCOUNTER — Encounter: Payer: Self-pay | Admitting: Family Medicine

## 2019-09-15 ENCOUNTER — Other Ambulatory Visit: Payer: Self-pay

## 2019-09-15 VITALS — BP 128/86 | HR 78 | Temp 98.1°F | Ht 70.0 in | Wt 235.8 lb

## 2019-09-15 DIAGNOSIS — R21 Rash and other nonspecific skin eruption: Secondary | ICD-10-CM | POA: Diagnosis not present

## 2019-09-15 MED ORDER — FLUCONAZOLE 100 MG PO TABS: 100.0000 mg | ORAL_TABLET | Freq: Every day | ORAL | 0 refills | Status: DC

## 2019-09-15 NOTE — Patient Instructions (Signed)
Leave off the Aquaphor and triple antibiotic  Continue with the Lotrimin twice daily  Keep area as dry as possible

## 2019-09-15 NOTE — Progress Notes (Signed)
  Subjective:     Patient ID: Billy Hansen, male   DOB: 1946/03/10, 73 y.o.   MRN: [id number]  HPI Patient seen with pruritic rash involving the glans and distal shaft of the penis.  This was first noted about a week ago.  He has pruritus but also some burning sensation.  He has had some cracking of the foreskin in a couple places.  He is circumcised but has some redundancy of foreskin.  He started using Lotrimin initially and had some improvement but then added triple antibiotic ointment and Aquaphor and the Aquaphor especially seem to make the rash and the burning worse.  He has no burning with urination  He has type 2 diabetes and does take Invokana.  No recent vesicles.  No history of STD.  Past Medical History:  Diagnosis Date  . Allergy   . Arthritis   . Diabetes mellitus without complication ([place])   . GERD (gastroesophageal reflux disease)   . Hyperlipidemia   . Hypertension    Past Surgical History:  Procedure Laterality Date  . COLON SURGERY  2005   due to injury at work per pt.   . COLON SURGERY  2006   revision of ostomy bag  . COLONOSCOPY  2006, 2017  . KNEE SURGERY     right knee    reports that he has never smoked. He has never used smokeless tobacco. He reports that he does not drink alcohol or use drugs. family history includes Cancer in his mother; Diabetes in his brother. Allergies  Allergen Reactions  . Januvia [Sitagliptin] Other (See Comments)    HA, fatigue  . Naproxen Sodium Itching  . Other     Some type of anesthetic following colonoscopy/ notes states Propofol     Review of Systems  Constitutional: Negative for chills and fever.  Genitourinary: Negative for dysuria.  Skin: Positive for rash.       Objective:   Physical Exam Constitutional:      Appearance: Normal appearance.  Pulmonary:     Effort: Pulmonary effort is normal.     Breath sounds: Normal breath sounds.  Skin:    Comments: Penis reveals some mild swelling of the distal  foreskin but this is retractable.  He has some nonspecific erythema of the glans and distal penis.  No visible vesicles.  No pustules.  Neurological:     Mental Status: He is alert.        Assessment:     Distal penile rash.-Question Candida/fungal.  Risk factor of diabetes and Invokana use    Plan:     -Leave off Aquaphor and triple antibiotic ointment -Continue Lotrimin topical twice daily -Fluconazole 100 mg once daily for 7 days -Follow-up with primary if not clearing over the next 1 to 2 weeks  Eulas Post MD [facility]

## 2019-09-21 DIAGNOSIS — H524 Presbyopia: Secondary | ICD-10-CM | POA: Diagnosis not present

## 2019-09-21 DIAGNOSIS — H5203 Hypermetropia, bilateral: Secondary | ICD-10-CM | POA: Diagnosis not present

## 2019-09-21 DIAGNOSIS — H2513 Age-related nuclear cataract, bilateral: Secondary | ICD-10-CM | POA: Diagnosis not present

## 2019-09-21 LAB — HM DIABETES EYE EXAM

## 2019-10-12 ENCOUNTER — Other Ambulatory Visit: Payer: Self-pay

## 2019-10-12 DIAGNOSIS — E1121 Type 2 diabetes mellitus with diabetic nephropathy: Secondary | ICD-10-CM

## 2019-10-12 MED ORDER — ACCU-CHEK GUIDE VI STRP: 1.0000 | ORAL_STRIP | Freq: Three times a day (TID) | 2 refills | Status: DC

## 2019-10-12 MED ORDER — ACCU-CHEK SOFTCLIX LANCETS MISC: 1.0000 | Freq: Three times a day (TID) | 2 refills | Status: AC

## 2019-10-12 MED ORDER — ACCU-CHEK GUIDE W/DEVICE KIT: 1.0000 | PACK | Freq: Three times a day (TID) | 0 refills | Status: AC

## 2019-10-15 ENCOUNTER — Encounter: Payer: Self-pay | Admitting: Adult Health

## 2019-11-13 ENCOUNTER — Ambulatory Visit: Payer: Medicare Other | Attending: Internal Medicine

## 2019-11-13 DIAGNOSIS — Z23 Encounter for immunization: Secondary | ICD-10-CM | POA: Insufficient documentation

## 2019-11-13 NOTE — Progress Notes (Signed)
   Covid-19 Vaccination Clinic  Name:  Billy Hansen    MRN: [id number] DOB: 05-18-1946  11/13/2019  [name] was observed post Covid-19 immunization for 15 minutes without incidence. He was provided with Vaccine Information Sheet and instruction to access the V-Safe system.   [name] was instructed to call 911 with any severe reactions post vaccine: Marland Kitchen Difficulty breathing  . Swelling of your face and throat  . A fast heartbeat  . A bad rash all over your body  . Dizziness and weakness    Immunizations Administered    Name Date Dose VIS Date Route   Pfizer COVID-19 Vaccine 11/13/2019  3:56 PM 0.3 mL 08/28/2019 Intramuscular   Manufacturer: [employer], [employer]   Lot: [id number]   NDC: [address]-3

## 2019-11-16 ENCOUNTER — Telehealth: Payer: Self-pay

## 2019-11-16 NOTE — Telephone Encounter (Signed)
We can try [place] 10

## 2019-11-16 NOTE — Telephone Encounter (Signed)
Insurance no longer covers Invokana, please advise.

## 2019-11-20 MED ORDER — FARXIGA 10 MG PO TABS: 10.0000 mg | ORAL_TABLET | Freq: Every day | ORAL | 1 refills | Status: DC

## 2019-12-01 ENCOUNTER — Other Ambulatory Visit: Payer: Self-pay

## 2019-12-03 ENCOUNTER — Other Ambulatory Visit: Payer: Self-pay

## 2019-12-03 ENCOUNTER — Encounter: Payer: Self-pay | Admitting: Internal Medicine

## 2019-12-03 ENCOUNTER — Ambulatory Visit (INDEPENDENT_AMBULATORY_CARE_PROVIDER_SITE_OTHER): Payer: Medicare Other | Admitting: Internal Medicine

## 2019-12-03 VITALS — BP 146/88 | HR 88 | Ht 70.0 in | Wt 233.0 lb

## 2019-12-03 DIAGNOSIS — E669 Obesity, unspecified: Secondary | ICD-10-CM

## 2019-12-03 DIAGNOSIS — E1121 Type 2 diabetes mellitus with diabetic nephropathy: Secondary | ICD-10-CM | POA: Diagnosis not present

## 2019-12-03 DIAGNOSIS — E785 Hyperlipidemia, unspecified: Secondary | ICD-10-CM | POA: Diagnosis not present

## 2019-12-03 DIAGNOSIS — E66811 Obesity, class 1: Secondary | ICD-10-CM

## 2019-12-03 LAB — POCT GLYCOSYLATED HEMOGLOBIN (HGB A1C): Hemoglobin A1C: 8.2 % — AB (ref 4.0–5.6)

## 2019-12-03 MED ORDER — FARXIGA 10 MG PO TABS: 10.0000 mg | ORAL_TABLET | Freq: Every day | ORAL | 3 refills | Status: DC

## 2019-12-03 NOTE — Patient Instructions (Signed)
Please continue: - Farxiga 10 mg in am - Metformin 1000 mg 2x daily with meals  Please stop at the lab.  Please return in 4-6 months with your sugar log.
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
## 2019-12-09 ENCOUNTER — Ambulatory Visit: Payer: Medicare Other | Attending: Internal Medicine

## 2019-12-09 DIAGNOSIS — Z23 Encounter for immunization: Secondary | ICD-10-CM

## 2019-12-09 NOTE — Progress Notes (Signed)
   Covid-19 Vaccination Clinic  Name:  Billy Hansen    MRN: [id number] DOB: January 08, 1946  12/09/2019  Billy Hansen was observed post Covid-19 immunization for 15 minutes without incident. He was provided with Vaccine Information Sheet and instruction to access the V-Safe system.   Billy Hansen was instructed to call 911 with any severe reactions post vaccine: Marland Kitchen Difficulty breathing  . Swelling of face and throat  . A fast heartbeat  . A bad rash all over body  . Dizziness and weakness   Immunizations Administered    Name Date Dose VIS Date Route   Pfizer COVID-19 Vaccine 12/09/2019 12:51 PM 0.3 mL 08/28/2019 Intramuscular   Manufacturer: [employer], [employer]   Lot: [id number]   NDC: [address]-9

## 2019-12-17 ENCOUNTER — Telehealth: Payer: Self-pay | Admitting: Adult Health

## 2019-12-17 DIAGNOSIS — I10 Essential (primary) hypertension: Secondary | ICD-10-CM

## 2019-12-17 MED ORDER — LISINOPRIL 20 MG PO TABS: 40.0000 mg | ORAL_TABLET | Freq: Every morning | ORAL | 1 refills | Status: DC

## 2019-12-17 NOTE — Addendum Note (Signed)
Addended by: Raj Janus T on: 12/17/2019 08:44 AM   Modules accepted: Orders

## 2019-12-17 NOTE — Telephone Encounter (Signed)
Sent to the pharmacy by e-scribe. 

## 2019-12-17 NOTE — Telephone Encounter (Signed)
Medication Refill: Lisinopril  Pharmacy: [facility] [address] [place]: [phone]

## 2020-01-18 ENCOUNTER — Telehealth: Payer: Self-pay

## 2020-01-18 DIAGNOSIS — E1121 Type 2 diabetes mellitus with diabetic nephropathy: Secondary | ICD-10-CM

## 2020-01-18 DIAGNOSIS — E785 Hyperlipidemia, unspecified: Secondary | ICD-10-CM

## 2020-01-18 DIAGNOSIS — I10 Essential (primary) hypertension: Secondary | ICD-10-CM

## 2020-01-18 NOTE — Telephone Encounter (Signed)
Patient's insurance is requesting RX be sent to [facility] and I put the pharmacy in and patient needs on medications that Dr Elvera Lennox fills.  Needs Farxiga 10 mg ASAP  Patient has 2 more days to go.  [facility] states that [facility] only gave him 30 Day Supply.   Please advise.

## 2020-01-18 NOTE — Telephone Encounter (Signed)
  [facility]  [phone]  Fax number [phone]  lisinopril (ZESTRIL) 20 MG tablet  metFORMIN (GLUCOPHAGE) 1000 MG tablet  sildenafil (REVATIO) 20 MG tablet  simvastatin (ZOCOR) 40 MG tablet

## 2020-01-19 MED ORDER — METFORMIN HCL 1000 MG PO TABS: 1000.0000 mg | ORAL_TABLET | Freq: Two times a day (BID) | ORAL | 3 refills | Status: DC

## 2020-01-19 MED ORDER — FARXIGA 10 MG PO TABS: 10.0000 mg | ORAL_TABLET | Freq: Every day | ORAL | 3 refills | Status: DC

## 2020-01-19 NOTE — Addendum Note (Signed)
Addended by: Darliss Ridgel I on: 01/19/2020 09:16 AM   Modules accepted: Orders

## 2020-01-19 NOTE — Telephone Encounter (Signed)
Okay to refill Metformin, Farxiga. The rest per PCP.

## 2020-01-19 NOTE — Telephone Encounter (Signed)
It is unclear why [facility] only gave him 30 tablets because a 90 day was sent with refills:  dapagliflozin propanediol (FARXIGA) 10 MG TABS tablet 90 tablet 3 12/03/2019    Sig - Route: Take 10 mg by mouth daily before breakfast. - Oral   Sent to pharmacy as: dapagliflozin propanediol (FARXIGA) 10 MG Tab tablet   E-Prescribing Status: Receipt confirmed by pharmacy (12/03/2019 9:38 AM EDT)   Pharmacy  [facility] - [place] - [address]   Even though he has refills at [facility] we will send to mail order.

## 2020-01-20 MED ORDER — SIMVASTATIN 40 MG PO TABS: 40.0000 mg | ORAL_TABLET | Freq: Every day | ORAL | 0 refills | Status: DC

## 2020-01-20 MED ORDER — LISINOPRIL 20 MG PO TABS: 40.0000 mg | ORAL_TABLET | Freq: Every morning | ORAL | 0 refills | Status: DC

## 2020-01-20 MED ORDER — SILDENAFIL CITRATE 20 MG PO TABS: ORAL_TABLET | ORAL | 0 refills | Status: DC

## 2020-01-20 NOTE — Addendum Note (Signed)
Addended by: Raj Janus T on: 01/20/2020 04:36 PM   Modules accepted: Orders

## 2020-01-20 NOTE — Telephone Encounter (Signed)
Sent to the pharmacy by e-scribe. 

## 2020-01-20 NOTE — Telephone Encounter (Signed)
Will check with the pt to see if he wants his rx to go to mail order or go to local pharmacy.

## 2020-03-22 ENCOUNTER — Other Ambulatory Visit: Payer: Self-pay | Admitting: Adult Health

## 2020-03-22 DIAGNOSIS — E785 Hyperlipidemia, unspecified: Secondary | ICD-10-CM

## 2020-03-23 NOTE — Telephone Encounter (Signed)
SENT TO THE PHARMACY BY E-SCRIBE FOR 90 DAYS.  PT HAS UPCOMING CPX. 

## 2020-04-01 ENCOUNTER — Other Ambulatory Visit: Payer: Self-pay

## 2020-04-01 ENCOUNTER — Encounter: Payer: Self-pay | Admitting: Adult Health

## 2020-04-01 ENCOUNTER — Ambulatory Visit (INDEPENDENT_AMBULATORY_CARE_PROVIDER_SITE_OTHER): Payer: Medicare Other | Admitting: Adult Health

## 2020-04-01 ENCOUNTER — Other Ambulatory Visit: Payer: Self-pay | Admitting: Adult Health

## 2020-04-01 VITALS — BP 130/80 | Temp 98.0°F | Ht 70.5 in | Wt 232.0 lb

## 2020-04-01 DIAGNOSIS — Z125 Encounter for screening for malignant neoplasm of prostate: Secondary | ICD-10-CM

## 2020-04-01 DIAGNOSIS — Z0000 Encounter for general adult medical examination without abnormal findings: Secondary | ICD-10-CM | POA: Diagnosis not present

## 2020-04-01 DIAGNOSIS — I10 Essential (primary) hypertension: Secondary | ICD-10-CM

## 2020-04-01 DIAGNOSIS — E785 Hyperlipidemia, unspecified: Secondary | ICD-10-CM

## 2020-04-01 DIAGNOSIS — K219 Gastro-esophageal reflux disease without esophagitis: Secondary | ICD-10-CM

## 2020-04-01 DIAGNOSIS — E1122 Type 2 diabetes mellitus with diabetic chronic kidney disease: Secondary | ICD-10-CM

## 2020-04-01 DIAGNOSIS — Z23 Encounter for immunization: Secondary | ICD-10-CM | POA: Diagnosis not present

## 2020-04-01 DIAGNOSIS — N529 Male erectile dysfunction, unspecified: Secondary | ICD-10-CM | POA: Diagnosis not present

## 2020-04-01 DIAGNOSIS — N183 Chronic kidney disease, stage 3 unspecified: Secondary | ICD-10-CM

## 2020-04-01 NOTE — Patient Instructions (Signed)
It was great seeing you today   We will follow up with you regarding your labs   Please schedule an appointment whenever you can for the shoulder

## 2020-04-01 NOTE — Progress Notes (Signed)
Subjective:    Patient ID: Billy Hansen, male    DOB: [birth year], 74 y.o.   MRN: [id number]  HPI Patient presents for yearly preventative medicine examination. He is a pleasant 74 year old male who  has a past medical history of Allergy, Arthritis, Diabetes mellitus without complication ([place]), GERD (gastroesophageal reflux disease), Hyperlipidemia, and Hypertension.  DM -managed by endocrinology.  He is currently prescribed Farxiga10 mg daily and Metformin 1000 mg twice daily.  His last A1c was 8.2, he never followed up to have the fructosamine level checked  Hypertension-currently controlled with Lasix 20 mg daily and lisinopril 40 mg daily.  He denies chest pain, shortness of breath, headaches, dizziness, blurred vision, or syncopal episodes.  BP Readings from Last 3 Encounters:  04/01/20 130/80  12/03/19 (!) 146/88  09/15/19 128/86   Hyperlipidemia-he takes Zocor and 81 mg aspirin daily. Lab Results  Component Value Date   CHOL 160 04/01/2019   HDL 41.60 04/01/2019   LDLCALC 91 04/01/2019   LDLDIRECT 149.4 07/12/2014   TRIG 137.0 04/01/2019   CHOLHDL 4 04/01/2019    ED-uses Viagra as needed  GERD-controlled with Prilosec.   All immunizations and health maintenance protocols were reviewed with the patient and needed orders were placed.  Appropriate screening laboratory values were ordered for the patient including screening of hyperlipidemia, renal function and hepatic function. If indicated by BPH, a PSA was ordered.  Medication reconciliation,  past medical history, social history, problem list and allergies were reviewed in detail with the patient  Goals were established with regard to weight loss, exercise, and  diet in compliance with medications  Wt Readings from Last 3 Encounters:  04/01/20 232 lb (105.2 kg)  12/03/19 233 lb (105.7 kg)  09/15/19 235 lb 12.8 oz (107 kg)    Review of Systems  Constitutional: Negative.   HENT: Negative.   Eyes: Negative.    Respiratory: Negative.   Cardiovascular: Negative.   Gastrointestinal: Negative.   Endocrine: Negative.   Genitourinary: Negative.   Musculoskeletal: Positive for arthralgias and back pain.  Skin: Negative.   Allergic/Immunologic: Negative.   Neurological: Negative.   Hematological: Negative.   Psychiatric/Behavioral: Negative.   All other systems reviewed and are negative.  Past Medical History:  Diagnosis Date  . Allergy   . Arthritis   . Diabetes mellitus without complication ([place])   . GERD (gastroesophageal reflux disease)   . Hyperlipidemia   . Hypertension     Social History   Socioeconomic History  . Marital status: Married    Spouse name: Not on file  . Number of children: Not on file  . Years of education: Not on file  . Highest education level: Not on file  Occupational History  . Not on file  Tobacco Use  . Smoking status: Never Smoker  . Smokeless tobacco: Never Used  Vaping Use  . Vaping Use: Never used  Substance and Sexual Activity  . Alcohol use: No  . Drug use: No  . Sexual activity: Yes    Partners: Female  Other Topics Concern  . Not on file  Social History Narrative   Regular exercise: walk   Caffeine use: 1 cup of coffee         Social Determinants of Health   Financial Resource Strain:   . Difficulty of Paying Living Expenses:   Food Insecurity:   . Worried About Charity fundraiser in the Last Year:   . [place] in the  Last Year:   Transportation Needs:   . Film/video editor (Medical):   Marland Kitchen Lack of Transportation (Non-Medical):   Physical Activity:   . Days of Exercise per Week:   . Minutes of Exercise per Session:   Stress:   . Feeling of Stress :   Social Connections:   . Frequency of Communication with Friends and Family:   . Frequency of Social Gatherings with Friends and Family:   . Attends Religious Services:   . Active Member of Clubs or Organizations:   . Attends Archivist Meetings:   Marland Kitchen  Marital Status:   Intimate Partner Violence:   . Fear of Current or Ex-Partner:   . Emotionally Abused:   Marland Kitchen Physically Abused:   . Sexually Abused:     Past Surgical History:  Procedure Laterality Date  . COLON SURGERY  2005   due to injury at work per pt.   . COLON SURGERY  2006   revision of ostomy bag  . COLONOSCOPY  2006, 2017  . KNEE SURGERY     right knee    Family History  Problem Relation Age of Onset  . Cancer Mother   . Diabetes Brother   . Colon cancer Neg Hx     Allergies  Allergen Reactions  . Januvia [Sitagliptin] Other (See Comments)    HA, fatigue  . Naproxen Sodium Itching  . Other     Some type of anesthetic following colonoscopy/ notes states Propofol    Current Outpatient Medications on File Prior to Visit  Medication Sig Dispense Refill  . Accu-Chek Softclix Lancets lancets 1 each by Other route 3 (three) times daily. E11.9 100 each 2  . aspirin 81 MG tablet Take 81 mg by mouth daily.    . Blood Glucose Monitoring Suppl (ACCU-CHEK GUIDE) w/Device KIT 1 each by Does not apply route 3 (three) times daily. E11.9 1 kit 0  . dapagliflozin propanediol (FARXIGA) 10 MG TABS tablet Take 10 mg by mouth daily before breakfast. 90 tablet 3  . furosemide (LASIX) 20 MG tablet TAKE ONE TABLET BY MOUTH ONCE DAILY 90 tablet 3  . glucose blood (ACCU-CHEK GUIDE) test strip 1 each by Other route 3 (three) times daily. E11.9 100 each 2  . lisinopril (ZESTRIL) 20 MG tablet Take 2 tablets (40 mg total) by mouth every morning. 180 tablet 0  . metFORMIN (GLUCOPHAGE) 1000 MG tablet Take 1 tablet (1,000 mg total) by mouth 2 (two) times daily with a meal. 180 tablet 3  . omeprazole (PRILOSEC) 40 MG capsule Take 1 tablet by mouth with breakfast every morning. 90 capsule 3  . sildenafil (REVATIO) 20 MG tablet TAKE 1 TO 2 TABELTS BY MOUTH 2 HOURS PRIOR TO SEX 30 tablet 0  . simvastatin (ZOCOR) 40 MG tablet TAKE 1 TABLET BY MOUTH AT  BEDTIME 90 tablet 0   No current  facility-administered medications on file prior to visit.    BP 130/80   Temp 98 F (36.7 C)   Ht 5' 10.5" (1.791 m)   Wt 232 lb (105.2 kg)   BMI 32.82 kg/m       Objective:   Physical Exam Vitals and nursing note reviewed.  Constitutional:      General: He is not in acute distress.    Appearance: Normal appearance. He is well-developed. He is obese.  HENT:     Head: Normocephalic and atraumatic.     Right Ear: Tympanic membrane, ear canal and external ear normal.  There is no impacted cerumen.     Left Ear: Tympanic membrane, ear canal and external ear normal. There is no impacted cerumen.     Nose: Nose normal. No congestion or rhinorrhea.     Mouth/Throat:     Mouth: Mucous membranes are moist.     Pharynx: Oropharynx is clear. No oropharyngeal exudate or posterior oropharyngeal erythema.  Eyes:     General:        Right eye: No discharge.        Left eye: No discharge.     Extraocular Movements: Extraocular movements intact.     Conjunctiva/sclera: Conjunctivae normal.     Pupils: Pupils are equal, round, and reactive to light.  Neck:     Vascular: No carotid bruit.     Trachea: No tracheal deviation.  Cardiovascular:     Rate and Rhythm: Normal rate and regular rhythm.     Pulses: Normal pulses.     Heart sounds: Normal heart sounds. No murmur heard.  No friction rub. No gallop.   Pulmonary:     Effort: Pulmonary effort is normal. No respiratory distress.     Breath sounds: Normal breath sounds. No stridor. No wheezing, rhonchi or rales.  Chest:     Chest wall: No tenderness.  Abdominal:     General: Bowel sounds are normal. There is no distension.     Palpations: Abdomen is soft. There is no mass.     Tenderness: There is no abdominal tenderness. There is no right CVA tenderness, left CVA tenderness, guarding or rebound.     Hernia: No hernia is present.  Musculoskeletal:        General: No swelling, tenderness, deformity or signs of injury. Normal range of  motion.     Right lower leg: No edema.     Left lower leg: No edema.  Lymphadenopathy:     Cervical: No cervical adenopathy.  Skin:    General: Skin is warm and dry.     Capillary Refill: Capillary refill takes less than 2 seconds.     Coloration: Skin is not jaundiced or pale.     Findings: No bruising, erythema, lesion or rash.  Neurological:     General: No focal deficit present.     Mental Status: He is alert and oriented to person, place, and time.     Cranial Nerves: No cranial nerve deficit.     Sensory: No sensory deficit.     Motor: No weakness.     Coordination: Coordination normal.     Gait: Gait normal.     Deep Tendon Reflexes: Reflexes normal.  Psychiatric:        Mood and Affect: Mood normal.        Behavior: Behavior normal.        Thought Content: Thought content normal.        Judgment: Judgment normal.       Assessment & Plan:  1. Routine general medical examination at a health care facility - Follow up in one year or sooner if needed - CBC with Differential/Platelet; Future - Comprehensive metabolic panel; Future - Lipid panel; Future - TSH; Future  2. Essential hypertension - No change in medication at this time  - CBC with Differential/Platelet; Future - Comprehensive metabolic panel; Future - Lipid panel; Future - TSH; Future  3. Controlled type 2 diabetes mellitus with stage 3 chronic kidney disease, without long-term current use of insulin ([place]) - Follow up with endocrinology as directed  -  CBC with Differential/Platelet; Future - Comprehensive metabolic panel; Future - Lipid panel; Future - TSH; Future  4. ERECTILE DYSFUNCTION, ORGANIC - Continue viagra as needed - CBC with Differential/Platelet; Future - Comprehensive metabolic panel; Future - Lipid panel; Future - TSH; Future  5. Hyperlipidemia, unspecified hyperlipidemia type - Consider increase in statin  - CBC with Differential/Platelet; Future - Comprehensive metabolic panel;  Future - Lipid panel; Future - TSH; Future  6. Prostate cancer screening  - PSA; Future  7. Gastroesophageal reflux disease, unspecified whether esophagitis present - Continue prilosec  - CBC with Differential/Platelet; Future - Comprehensive metabolic panel; Future - Lipid panel; Future - TSH; Future  Dorothyann Peng, NP

## 2020-04-01 NOTE — Addendum Note (Signed)
Addended by: Raj Janus T on: 04/01/2020 11:01 AM   Modules accepted: Orders

## 2020-04-02 LAB — PSA: PSA: 0.7 ng/mL (ref <=4.0)

## 2020-04-02 LAB — CBC WITH DIFFERENTIAL/PLATELET
Absolute Monocytes: 344 cells/uL (ref 200–950)
Basophils Absolute: 30 cells/uL (ref 0–200)
Basophils Relative: 0.7 %
Eosinophils Absolute: 82 cells/uL (ref 15–500)
Eosinophils Relative: 1.9 %
HCT: 47.4 % (ref 38.5–50.0)
Hemoglobin: 15.7 g/dL (ref 13.2–17.1)
Lymphs Abs: 1802 cells/uL (ref 850–3900)
MCH: 29 pg (ref 27.0–33.0)
MCHC: 33.1 g/dL (ref 32.0–36.0)
MCV: 87.5 fL (ref 80.0–100.0)
MPV: 10.9 fL (ref 7.5–12.5)
Monocytes Relative: 8 %
Neutro Abs: 2043 cells/uL (ref 1500–7800)
Neutrophils Relative %: 47.5 %
Platelets: 143 10*3/uL (ref 140–400)
RBC: 5.42 10*6/uL (ref 4.20–5.80)
RDW: 12.6 % (ref 11.0–15.0)
Total Lymphocyte: 41.9 %
WBC: 4.3 10*3/uL (ref 3.8–10.8)

## 2020-04-02 LAB — LIPID PANEL
Cholesterol: 151 mg/dL (ref <200)
HDL: 41 mg/dL (ref >=40)
LDL Cholesterol (Calc): 83 mg/dL (calc)
Non-HDL Cholesterol (Calc): 110 mg/dL (calc) (ref <130)
Total CHOL/HDL Ratio: 3.7 (calc) (ref <5.0)
Triglycerides: 168 mg/dL — ABNORMAL HIGH (ref <150)

## 2020-04-02 LAB — TSH: TSH: 0.86 mIU/L (ref 0.40–4.50)

## 2020-04-02 LAB — COMPREHENSIVE METABOLIC PANEL
AG Ratio: 2.1 (calc) (ref 1.0–2.5)
ALT: 20 U/L (ref 9–46)
AST: 13 U/L (ref 10–35)
Albumin: 4.4 g/dL (ref 3.6–5.1)
Alkaline phosphatase (APISO): 76 U/L (ref 35–144)
BUN: 15 mg/dL (ref 7–25)
CO2: 25 mmol/L (ref 20–32)
Calcium: 9.4 mg/dL (ref 8.6–10.3)
Chloride: 106 mmol/L (ref 98–110)
Creat: 1.11 mg/dL (ref 0.70–1.18)
Globulin: 2.1 g/dL (calc) (ref 1.9–3.7)
Glucose, Bld: 174 mg/dL — ABNORMAL HIGH (ref 65–99)
Potassium: 4.3 mmol/L (ref 3.5–5.3)
Sodium: 141 mmol/L (ref 135–146)
Total Bilirubin: 1.1 mg/dL (ref 0.2–1.2)
Total Protein: 6.5 g/dL (ref 6.1–8.1)

## 2020-04-10 ENCOUNTER — Encounter: Payer: Self-pay | Admitting: Internal Medicine

## 2020-04-14 ENCOUNTER — Ambulatory Visit (INDEPENDENT_AMBULATORY_CARE_PROVIDER_SITE_OTHER): Payer: Medicare Other | Admitting: Adult Health

## 2020-04-14 ENCOUNTER — Other Ambulatory Visit: Payer: Self-pay

## 2020-04-14 ENCOUNTER — Encounter: Payer: Self-pay | Admitting: Adult Health

## 2020-04-14 ENCOUNTER — Other Ambulatory Visit: Payer: Self-pay | Admitting: Endocrinology

## 2020-04-14 VITALS — BP 120/78 | Temp 98.5°F | Wt 231.0 lb

## 2020-04-14 DIAGNOSIS — M7541 Impingement syndrome of right shoulder: Secondary | ICD-10-CM

## 2020-04-14 MED ORDER — METHYLPREDNISOLONE ACETATE 80 MG/ML IJ SUSP
80.0000 mg | Freq: Once | INTRAMUSCULAR | Status: AC
  Administered 2020-04-14: 80 mg via INTRA_ARTICULAR

## 2020-04-14 MED ORDER — RYBELSUS 3 MG PO TABS: 3.0000 mg | ORAL_TABLET | Freq: Every day | ORAL | 3 refills | Status: DC

## 2020-04-14 NOTE — Progress Notes (Signed)
Subjective:    Patient ID: Billy Hansen, male    DOB: 1946/03/24, 74 y.o.   MRN: [id number]  HPI 74 year old male who  has a past medical history of Allergy, Arthritis, Diabetes mellitus without complication ([place]), GERD (gastroesophageal reflux disease), Hyperlipidemia, and Hypertension.  He presents to the office today for chronic right shoulder pain.  In the past he has been seen by orthopedics and received steroid injections the last being approximately 1 year ago.  He reports that he has responded well to steroid injections in the past and has not had any pain until about a month ago when he started noticing he was having issues with range of motion and pain of the right shoulder.  Pain is worse with certain movements such as reaching for something above his head.  He is able to scratch his back but does endorse pain with this as well.  Review of Systems See HPI   Past Medical History:  Diagnosis Date  . Allergy   . Arthritis   . Diabetes mellitus without complication ([place])   . GERD (gastroesophageal reflux disease)   . Hyperlipidemia   . Hypertension     Social History   Socioeconomic History  . Marital status: Married    Spouse name: Not on file  . Number of children: Not on file  . Years of education: Not on file  . Highest education level: Not on file  Occupational History  . Not on file  Tobacco Use  . Smoking status: Never Smoker  . Smokeless tobacco: Never Used  Vaping Use  . Vaping Use: Never used  Substance and Sexual Activity  . Alcohol use: No  . Drug use: No  . Sexual activity: Yes    Partners: Female  Other Topics Concern  . Not on file  Social History Narrative   Regular exercise: walk   Caffeine use: 1 cup of coffee         Social Determinants of Health   Financial Resource Strain:   . Difficulty of Paying Living Expenses:   Food Insecurity:   . Worried About Charity fundraiser in the Last Year:   . Arboriculturist in the Last Year:     Transportation Needs:   . Film/video editor (Medical):   Marland Kitchen Lack of Transportation (Non-Medical):   Physical Activity:   . Days of Exercise per Week:   . Minutes of Exercise per Session:   Stress:   . Feeling of Stress :   Social Connections:   . Frequency of Communication with Friends and Family:   . Frequency of Social Gatherings with Friends and Family:   . Attends Religious Services:   . Active Member of Clubs or Organizations:   . Attends Archivist Meetings:   Marland Kitchen Marital Status:   Intimate Partner Violence:   . Fear of Current or Ex-Partner:   . Emotionally Abused:   Marland Kitchen Physically Abused:   . Sexually Abused:     Past Surgical History:  Procedure Laterality Date  . COLON SURGERY  2005   due to injury at work per pt.   . COLON SURGERY  2006   revision of ostomy bag  . COLONOSCOPY  2006, 2017  . KNEE SURGERY     right knee    Family History  Problem Relation Age of Onset  . Cancer Mother   . Diabetes Brother   . Colon cancer Neg Hx  Allergies  Allergen Reactions  . Januvia [Sitagliptin] Other (See Comments)    HA, fatigue  . Naproxen Sodium Itching  . Other     Some type of anesthetic following colonoscopy/ notes states Propofol    Current Outpatient Medications on File Prior to Visit  Medication Sig Dispense Refill  . Accu-Chek Softclix Lancets lancets 1 each by Other route 3 (three) times daily. E11.9 100 each 2  . aspirin 81 MG tablet Take 81 mg by mouth daily.    . Blood Glucose Monitoring Suppl (ACCU-CHEK GUIDE) w/Device KIT 1 each by Does not apply route 3 (three) times daily. E11.9 1 kit 0  . dapagliflozin propanediol (FARXIGA) 10 MG TABS tablet Take 10 mg by mouth daily before breakfast. 90 tablet 3  . glucose blood (ACCU-CHEK GUIDE) test strip 1 each by Other route 3 (three) times daily. E11.9 100 each 2  . lisinopril (ZESTRIL) 20 MG tablet Take 2 tablets (40 mg total) by mouth every morning. 180 tablet 0  . metFORMIN (GLUCOPHAGE)  1000 MG tablet Take 1 tablet (1,000 mg total) by mouth 2 (two) times daily with a meal. 180 tablet 3  . omeprazole (PRILOSEC) 40 MG capsule Take 1 tablet by mouth with breakfast every morning. 90 capsule 3  . sildenafil (REVATIO) 20 MG tablet TAKE 1 TO 2 TABELTS BY MOUTH 2 HOURS PRIOR TO SEX 30 tablet 0  . simvastatin (ZOCOR) 40 MG tablet TAKE 1 TABLET BY MOUTH AT  BEDTIME 90 tablet 0   No current facility-administered medications on file prior to visit.    BP 120/78   Temp 98.5 F (36.9 C)   Wt (!) 231 lb (104.8 kg)   BMI 32.68 kg/m       Objective:   Physical Exam Vitals and nursing note reviewed.  Constitutional:      Appearance: Normal appearance.  Musculoskeletal:        General: Tenderness present. No swelling.     Right shoulder: Tenderness and bony tenderness present. No crepitus. Decreased range of motion. Normal strength.     Comments: He has a positive Neer and Hawkins sign. Negative back scratch test and can empty test    Neurological:     General: No focal deficit present.     Mental Status: He is alert and oriented to person, place, and time.  Psychiatric:        Mood and Affect: Mood normal.        Behavior: Behavior normal.        Thought Content: Thought content normal.        Judgment: Judgment normal.       Assessment & Plan:  1. Impingement syndrome of right shoulder Shoulder injection Verbal consent obtained and verified. Sterile betadine prep. Furthur cleansed with alcohol. Topical analgesic spray: Ethyl chloride. Joint: right  subacromial injection Approached in typical fashion with: posterior approach Completed without difficulty Meds: 3 cc lidocaine 2% no epi, 1 cc depomedrol 41m/cc Needle:1.5 inch 25 gauge Aftercare instructions and Red flags advised. Immediate improvement in pain noted  - methylPREDNISolone acetate (DEPO-MEDROL) injection 80 mg  CDorothyann Peng NP

## 2020-04-20 ENCOUNTER — Other Ambulatory Visit: Payer: Self-pay | Admitting: Adult Health

## 2020-04-20 DIAGNOSIS — I10 Essential (primary) hypertension: Secondary | ICD-10-CM

## 2020-04-20 NOTE — Telephone Encounter (Signed)
SENT TO THE PHARMACY BY E-SCRIBE. 

## 2020-04-27 ENCOUNTER — Other Ambulatory Visit: Payer: Self-pay | Admitting: Endocrinology

## 2020-04-27 MED ORDER — RYBELSUS 7 MG PO TABS: 3.0000 mg | ORAL_TABLET | Freq: Every day | ORAL | 3 refills | Status: DC

## 2020-04-28 ENCOUNTER — Other Ambulatory Visit: Payer: Self-pay

## 2020-04-28 DIAGNOSIS — E1121 Type 2 diabetes mellitus with diabetic nephropathy: Secondary | ICD-10-CM

## 2020-04-28 MED ORDER — RYBELSUS 7 MG PO TABS: 7.0000 mg | ORAL_TABLET | Freq: Every day | ORAL | 3 refills | Status: DC

## 2020-05-13 ENCOUNTER — Other Ambulatory Visit: Payer: Self-pay

## 2020-05-13 ENCOUNTER — Ambulatory Visit (INDEPENDENT_AMBULATORY_CARE_PROVIDER_SITE_OTHER): Payer: Medicare Other

## 2020-05-13 DIAGNOSIS — Z0000 Encounter for general adult medical examination without abnormal findings: Secondary | ICD-10-CM

## 2020-05-13 NOTE — Patient Instructions (Signed)
[name] , Thank you for taking time to come for your Medicare Wellness Visit. I appreciate your ongoing commitment to your health goals. Please review the following plan we discussed and let me know if I can assist you in the future.   Screening recommendations/referrals: Colonoscopy: Up to date, next due 01/30/2026 Recommended yearly ophthalmology/optometry visit for glaucoma screening and checkup Recommended yearly dental visit for hygiene and checkup  Vaccinations: Influenza vaccine: up to date, next due this fall 2021 Pneumococcal vaccine: Completed series  Tdap vaccine: Up to date, next due 09/25/2024 Shingles vaccine: Currently due for shingrix, please contact your pharmacy to discuss cost and to receive vaccine     Advanced directives: Advance directive discussed with you today. Even though you declined this today please call our office should you change your mind and we can give you the proper paperwork for you to fill out.   Conditions/risks identified: Please try to incorporate at least 30 consistent minutes of exercise into your daily routine   Next appointment: None   Preventive Care 65 Years and Older, Male Preventive care refers to lifestyle choices and visits with your health care provider that can promote health and wellness. What does preventive care include?  A yearly physical exam. This is also called an annual well check.  Dental exams once or twice a year.  Routine eye exams. Ask your health care provider how often you should have your eyes checked.  Personal lifestyle choices, including:  Daily care of your teeth and gums.  Regular physical activity.  Eating a healthy diet.  Avoiding tobacco and drug use.  Limiting alcohol use.  Practicing safe sex.  Taking low doses of aspirin every day.  Taking vitamin and mineral supplements as recommended by your health care provider. What happens during an annual well check? The services and screenings  done by your health care provider during your annual well check will depend on your age, overall health, lifestyle risk factors, and family history of disease. Counseling  Your health care provider may ask you questions about your:  Alcohol use.  Tobacco use.  Drug use.  Emotional well-being.  Home and relationship well-being.  Sexual activity.  Eating habits.  History of falls.  Memory and ability to understand (cognition).  Work and work Astronomer. Screening  You may have the following tests or measurements:  Height, weight, and BMI.  Blood pressure.  Lipid and cholesterol levels. These may be checked every 5 years, or more frequently if you are over 73 years old.  Skin check.  Lung cancer screening. You may have this screening every year starting at age 37 if you have a 30-pack-year history of smoking and currently smoke or have quit within the past 15 years.  Fecal occult blood test (FOBT) of the stool. You may have this test every year starting at age 61.  Flexible sigmoidoscopy or colonoscopy. You may have a sigmoidoscopy every 5 years or a colonoscopy every 10 years starting at age 42.  Prostate cancer screening. Recommendations will vary depending on your family history and other risks.  Hepatitis C blood test.  Hepatitis B blood test.  Sexually transmitted disease (STD) testing.  Diabetes screening. This is done by checking your blood sugar (glucose) after you have not eaten for a while (fasting). You may have this done every 1-3 years.  Abdominal aortic aneurysm (AAA) screening. You may need this if you are a current or former smoker.  Osteoporosis. You may be screened starting at  age 80 if you are at high risk. Talk with your health care provider about your test results, treatment options, and if necessary, the need for more tests. Vaccines  Your health care provider may recommend certain vaccines, such as:  Influenza vaccine. This is recommended  every year.  Tetanus, diphtheria, and acellular pertussis (Tdap, Td) vaccine. You may need a Td booster every 10 years.  Zoster vaccine. You may need this after age 85.  Pneumococcal 13-valent conjugate (PCV13) vaccine. One dose is recommended after age 62.  Pneumococcal polysaccharide (PPSV23) vaccine. One dose is recommended after age 57. Talk to your health care provider about which screenings and vaccines you need and how often you need them. This information is not intended to replace advice given to you by your health care provider. Make sure you discuss any questions you have with your health care provider. Document Released: 09/30/2015 Document Revised: 05/23/2016 Document Reviewed: 07/05/2015 Elsevier Interactive Patient Education  2017 [place] Prevention in the Home Falls can cause injuries. They can happen to people of all ages. There are many things you can do to make your home safe and to help prevent falls. What can I do on the outside of my home?  Regularly fix the edges of walkways and driveways and fix any cracks.  Remove anything that might make you trip as you walk through a door, such as a raised step or threshold.  Trim any bushes or trees on the path to your home.  Use bright outdoor lighting.  Clear any walking paths of anything that might make someone trip, such as rocks or tools.  Regularly check to see if handrails are loose or broken. Make sure that both sides of any steps have handrails.  Any raised decks and porches should have guardrails on the edges.  Have any leaves, snow, or ice cleared regularly.  Use sand or salt on walking paths during winter.  Clean up any spills in your garage right away. This includes oil or grease spills. What can I do in the bathroom?  Use night lights.  Install grab bars by the toilet and in the tub and shower. Do not use towel bars as grab bars.  Use non-skid mats or decals in the tub or shower.  If  you need to sit down in the shower, use a plastic, non-slip stool.  Keep the floor dry. Clean up any water that spills on the floor as soon as it happens.  Remove soap buildup in the tub or shower regularly.  Attach bath mats securely with double-sided non-slip rug tape.  Do not have throw rugs and other things on the floor that can make you trip. What can I do in the bedroom?  Use night lights.  Make sure that you have a light by your bed that is easy to reach.  Do not use any sheets or blankets that are too big for your bed. They should not hang down onto the floor.  Have a firm chair that has side arms. You can use this for support while you get dressed.  Do not have throw rugs and other things on the floor that can make you trip. What can I do in the kitchen?  Clean up any spills right away.  Avoid walking on wet floors.  Keep items that you use a lot in easy-to-reach places.  If you need to reach something above you, use a strong step stool that has a grab bar.  Keep electrical cords out of the way.  Do not use floor polish or wax that makes floors slippery. If you must use wax, use non-skid floor wax.  Do not have throw rugs and other things on the floor that can make you trip. What can I do with my stairs?  Do not leave any items on the stairs.  Make sure that there are handrails on both sides of the stairs and use them. Fix handrails that are broken or loose. Make sure that handrails are as long as the stairways.  Check any carpeting to make sure that it is firmly attached to the stairs. Fix any carpet that is loose or worn.  Avoid having throw rugs at the top or bottom of the stairs. If you do have throw rugs, attach them to the floor with carpet tape.  Make sure that you have a light switch at the top of the stairs and the bottom of the stairs. If you do not have them, ask someone to add them for you. What else can I do to help prevent falls?  Wear shoes  that:  Do not have high heels.  Have rubber bottoms.  Are comfortable and fit you well.  Are closed at the toe. Do not wear sandals.  If you use a stepladder:  Make sure that it is fully opened. Do not climb a closed stepladder.  Make sure that both sides of the stepladder are locked into place.  Ask someone to hold it for you, if possible.  Clearly mark and make sure that you can see:  Any grab bars or handrails.  First and last steps.  Where the edge of each step is.  Use tools that help you move around (mobility aids) if they are needed. These include:  Canes.  Walkers.  Scooters.  Crutches.  Turn on the lights when you go into a dark area. Replace any light bulbs as soon as they burn out.  Set up your furniture so you have a clear path. Avoid moving your furniture around.  If any of your floors are uneven, fix them.  If there are any pets around you, be aware of where they are.  Review your medicines with your doctor. Some medicines can make you feel dizzy. This can increase your chance of falling. Ask your doctor what other things that you can do to help prevent falls. This information is not intended to replace advice given to you by your health care provider. Make sure you discuss any questions you have with your health care provider. Document Released: 06/30/2009 Document Revised: 02/09/2016 Document Reviewed: 10/08/2014 Elsevier Interactive Patient Education  2017 [employer].

## 2020-05-13 NOTE — Progress Notes (Signed)
Subjective:   Billy Hansen is a 74 y.o. male who presents for Medicare Annual/Subsequent preventive examination.  I connected with Luisa Hart today by telephone and verified that I am speaking with the correct person using two identifiers. Location patient: home Location provider: work Persons participating in the virtual visit: patient, provider.   I discussed the limitations, risks, security and privacy concerns of performing an evaluation and management service by telephone and the availability of in person appointments. I also discussed with the patient that there may be a patient responsible charge related to this service. The patient expressed understanding and verbally consented to this telephonic visit.    Interactive audio and video telecommunications were attempted between this provider and patient, however failed, due to patient having technical difficulties OR patient did not have access to video capability.  We continued and completed visit with audio only.      Review of Systems    N/A Cardiac Risk Factors include: advanced age (>25mn, >>50women);hypertension;male gender;diabetes mellitus;dyslipidemia     Objective:    Today's Vitals   There is no height or weight on file to calculate BMI.  Advanced Directives 05/13/2020 04/28/2018 01/18/2016 05/17/2015  Does Patient Have a Medical Advance Directive? No No No No  Would patient like information on creating a medical advance directive? No - Patient declined - - No - patient declined information    Current Medications (verified) Outpatient Encounter Medications as of 05/13/2020  Medication Sig  . Accu-Chek Softclix Lancets lancets 1 each by Other route 3 (three) times daily. E11.9  . aspirin 81 MG tablet Take 81 mg by mouth daily.  . Blood Glucose Monitoring Suppl (ACCU-CHEK GUIDE) w/Device KIT 1 each by Does not apply route 3 (three) times daily. E11.9  . dapagliflozin propanediol (FARXIGA) 10 MG TABS tablet  Take 10 mg by mouth daily before breakfast.  . glucose blood (ACCU-CHEK GUIDE) test strip 1 each by Other route 3 (three) times daily. E11.9  . lisinopril (ZESTRIL) 20 MG tablet TAKE 2 TABLETS BY MOUTH IN  THE MORNING  . metFORMIN (GLUCOPHAGE) 1000 MG tablet Take 1 tablet (1,000 mg total) by mouth 2 (two) times daily with a meal.  . omeprazole (PRILOSEC) 40 MG capsule Take 1 tablet by mouth with breakfast every morning.  . Semaglutide (RYBELSUS) 7 MG TABS Take 7 mg by mouth daily.  . sildenafil (REVATIO) 20 MG tablet TAKE 1 TO 2 TABELTS BY MOUTH 2 HOURS PRIOR TO SEX  . simvastatin (ZOCOR) 40 MG tablet TAKE 1 TABLET BY MOUTH AT  BEDTIME   No facility-administered encounter medications on file as of 05/13/2020.    Allergies (verified) Januvia [sitagliptin], Naproxen sodium, and Other   History: Past Medical History:  Diagnosis Date  . Allergy   . Arthritis   . Diabetes mellitus [place] complication ([place]   . GERD (gastroesophageal reflux disease)   . Hyperlipidemia   . Hypertension    Past Surgical History:  Procedure Laterality Date  . COLON SURGERY  2005   due to injury at work per pt.   . COLON SURGERY  2006   revision of ostomy bag  . COLONOSCOPY  2006, 2017  . KNEE SURGERY     right knee   Family History  Problem Relation Age of Onset  . Cancer Mother   . Diabetes Brother   . Colon cancer Neg Hx    Social History   Socioeconomic History  . Marital status: Married    Spouse  name: Not on file  . Number of children: Not on file  . Years of education: Not on file  . Highest education level: Not on file  Occupational History  . Not on file  Tobacco Use  . Smoking status: Never Smoker  . Smokeless tobacco: Never Used  Vaping Use  . Vaping Use: Never used  Substance and Sexual Activity  . Alcohol use: No  . Drug use: No  . Sexual activity: Yes    Partners: Female  Other Topics Concern  . Not on file  Social History Narrative   Regular exercise: walk    Caffeine use: 1 cup of coffee         Social Determinants of Health   Financial Resource Strain: Low Risk   . Difficulty of Paying Living Expenses: Not hard at all  Food Insecurity: No Food Insecurity  . Worried About Charity fundraiser in the Last Year: Never true  . Ran Out of Food in the Last Year: Never true  Transportation Needs: No Transportation Needs  . Lack of Transportation (Medical): No  . Lack of Transportation (Non-Medical): No  Physical Activity: Inactive  . Days of Exercise per Week: 0 days  . Minutes of Exercise per Session: 0 min  Stress: No Stress Concern Present  . Feeling of Stress : Not at all  Social Connections: Moderately Integrated  . Frequency of Communication with Friends and Family: Three times a week  . Frequency of Social Gatherings with Friends and Family: More than three times a week  . Attends Religious Services: More than 4 times per year  . Active Member of Clubs or Organizations: No  . Attends Archivist Meetings: Never  . Marital Status: Married    Tobacco Counseling Counseling given: Not Answered   Clinical Intake:  Pre-visit preparation completed: Yes  Pain : No/denies pain     Nutritional Risks: None Diabetes: Yes (Patient states checks glucose once a day) CBG done?: No Did pt. bring in CBG monitor from home?: No  How often do you need to have someone help you when you read instructions, pamphlets, or other written materials from your doctor or pharmacy?: 1 - Never What is the last grade level you completed in school?: 8th grade  Diabetic?Yes  Interpreter Needed?: No  Information entered by :: [place] of Daily Living In your present state of health, do you have any difficulty performing the following activities: 05/13/2020  Hearing? Y  Comment Has some difficulties hearing  Vision? Y  Comment has difficulties seeing really small print up close  Difficulty concentrating or making decisions? N   Walking or climbing stairs? Y  Comment Has arthritis and gets pain when climbing stairs  Dressing or bathing? N  Doing errands, shopping? N  Preparing Food and eating ? N  Using the Toilet? N  In the past six months, have you accidently leaked urine? N  Do you have problems with loss of bowel control? N  Managing your Medications? N  Managing your Finances? N  Housekeeping or managing your Housekeeping? N  Some recent data might be hidden    Patient Care Team: Dorothyann Peng, NP as PCP - General (Family Medicine)  Indicate any recent Medical Services you may have received from other than [facility] providers in the past year (date may be approximate).     Assessment:   This is a routine wellness examination for Billy Hansen.  Hearing/Vision screen  Hearing Screening  125Hz  250Hz  500Hz  1000Hz  2000Hz  3000Hz  4000Hz  6000Hz  8000Hz   Right ear:           Left ear:           Vision Screening Comments: Patient states gets eyes checked yearly   Dietary issues and exercise activities discussed: Current Exercise Habits: The patient does not participate in regular exercise at present, Exercise limited by: orthopedic condition(s)  Goals    . Patient Stated     Staying healthy and making good choices Continue to lose some weight       Depression Screen PHQ 2/9 Scores 05/13/2020 04/01/2020 04/01/2020 04/28/2018 09/25/2016 08/16/2014  PHQ - 2 Score 0 0 0 0 0 0  PHQ- 9 Score 0 - - - - -    Fall Risk Fall Risk  05/13/2020 04/01/2020 04/01/2020 04/28/2018 09/25/2016  Falls in the past year? 1 1 1  No No  Number falls in past yr: 1 1 1  - -  Injury with Fall? 0 0 0 - -  Risk for fall due to : Medication side effect;History of fall(s) - - - -  Follow up Falls evaluation completed;Falls prevention discussed - - - -    Any stairs in or around the home? Yes  If so, are there any without handrails? No  Home free of loose throw rugs in walkways, pet beds, electrical cords, etc? Yes  Adequate lighting in your  home to reduce risk of falls? Yes   ASSISTIVE DEVICES UTILIZED TO PREVENT FALLS:  Life alert? No  Use of a cane, walker or w/c? No  Grab bars in the bathroom? Yes  Shower chair or bench in shower? No  Elevated toilet seat or a handicapped toilet? Yes     Cognitive Function:  Patient declined cognitive screening MMSE - Mini Mental State Exam 04/28/2018  Not completed: (No Data)        Immunizations Immunization History  Administered Date(s) Administered  . Fluad Quad(high Dose 65+) 08/05/2019  . Influenza Whole 06/24/2006, 07/07/2007, 08/03/2009  . Influenza, High Dose Seasonal PF 07/18/2016, 09/30/2017  . Influenza,inj,Quad PF,6+ Mos 07/27/2013, 08/16/2014, 07/15/2015, 07/30/2018  . PFIZER SARS-COV-2 Vaccination 11/13/2019, 12/09/2019  . Pneumococcal Conjugate-13 08/16/2014  . Pneumococcal Polysaccharide-23 10/29/2012, 04/01/2020  . Td 04/02/2006  . Tdap 09/25/2016    TDAP status: Up to date Flu Vaccine status: Up to date Pneumococcal vaccine status: Up to date Covid-19 vaccine status: Completed vaccines  Qualifies for Shingles Vaccine? Yes   Zostavax completed No   Shingrix Completed?: No.    Education has been provided regarding the importance of this vaccine. Patient has been advised to call insurance company to determine out of pocket expense if they have not yet received this vaccine. Advised may also receive vaccine at local pharmacy or Health Dept. Verbalized acceptance and understanding.  Screening Tests Health Maintenance  Topic Date Due  . INFLUENZA VACCINE  04/17/2020  . HEMOGLOBIN A1C  06/04/2020  . OPHTHALMOLOGY EXAM  09/20/2020  . FOOT EXAM  04/01/2021  . COLONOSCOPY  01/31/2026  . TETANUS/TDAP  09/25/2026  . COVID-19 Vaccine  Completed  . Hepatitis C Screening  Completed  . PNA vac Low Risk Adult  Completed    Health Maintenance  Health Maintenance Due  Topic Date Due  . INFLUENZA VACCINE  04/17/2020    Colorectal cancer screening:  Completed 02/01/2016. Repeat every 10 years  Lung Cancer Screening: (Low Dose CT Chest recommended if Age 45-80 years, 30 pack-year currently smoking OR have quit w/in 15years.)  does not qualify.   Lung Cancer Screening Referral: N/A  Additional Screening:  Hepatitis C Screening: does qualify; Completed 09/30/2017  Vision Screening: Recommended annual ophthalmology exams for early detection of glaucoma and other disorders of the eye. Is the patient up to date with their annual eye exam?  Yes  Who is the provider or what is the name of the office in which the patient attends annual eye exams? [facility]  If pt is not established with a provider, would they like to be referred to a provider to establish care? No .   Dental Screening: Recommended annual dental exams for proper oral hygiene  Community Resource Referral / Chronic Care Management: CRR required this visit?  No   CCM required this visit?  No      Plan:     I have personally reviewed and noted the following in the patient's chart:   . Medical and social history . Use of alcohol, tobacco or illicit drugs  . Current medications and supplements . Functional ability and status . Nutritional status . Physical activity . Advanced directives . List of other physicians . Hospitalizations, surgeries, and ER visits in previous 12 months . Vitals . Screenings to include cognitive, depression, and falls . Referrals and appointments  In addition, I have reviewed and discussed with patient certain preventive protocols, quality metrics, and best practice recommendations. A written personalized care plan for preventive services as well as general preventive health recommendations were provided to patient.     Ofilia Neas, LPN   [id number]   Nurse Notes: None

## 2020-06-08 ENCOUNTER — Ambulatory Visit (INDEPENDENT_AMBULATORY_CARE_PROVIDER_SITE_OTHER): Payer: Medicare Other | Admitting: Internal Medicine

## 2020-06-08 ENCOUNTER — Encounter: Payer: Self-pay | Admitting: Internal Medicine

## 2020-06-08 ENCOUNTER — Other Ambulatory Visit: Payer: Self-pay

## 2020-06-08 VITALS — BP 144/102 | HR 71 | Temp 97.9°F | Ht 70.5 in | Wt 227.0 lb

## 2020-06-08 DIAGNOSIS — E785 Hyperlipidemia, unspecified: Secondary | ICD-10-CM

## 2020-06-08 DIAGNOSIS — E1121 Type 2 diabetes mellitus with diabetic nephropathy: Secondary | ICD-10-CM

## 2020-06-08 DIAGNOSIS — E669 Obesity, unspecified: Secondary | ICD-10-CM | POA: Diagnosis not present

## 2020-06-08 LAB — POCT GLYCOSYLATED HEMOGLOBIN (HGB A1C): Hemoglobin A1C: 7.4 % — AB (ref 4.0–5.6)

## 2020-06-08 MED ORDER — ACCU-CHEK GUIDE VI STRP: 1.0000 | ORAL_STRIP | Freq: Every day | 3 refills | Status: DC

## 2020-06-08 NOTE — Patient Instructions (Addendum)
Please continue: - Metformin 1000 mg 2x a day with meals - Farxiga 10 mg before b'fast - Rybelsus 7 mg before b'fast  Please stop at the lab.  Please return in 4 months with your sugar log.  
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
## 2020-06-10 LAB — FRUCTOSAMINE: Fructosamine: 311 umol/L — ABNORMAL HIGH (ref 205–285)

## 2020-07-06 ENCOUNTER — Telehealth: Payer: Self-pay

## 2020-07-06 MED ORDER — SILDENAFIL CITRATE 20 MG PO TABS
ORAL_TABLET | ORAL | 0 refills | Status: DC
Start: 2020-07-06 — End: 2021-01-19

## 2020-07-06 NOTE — Telephone Encounter (Signed)
Ok to refill 

## 2020-07-06 NOTE — Telephone Encounter (Signed)
Patient called in stating he needed a medication refill on his medication    sildenafil (REVATIO) 20 MG tablet  [id number] - [place], [place] - [address]

## 2020-07-06 NOTE — Telephone Encounter (Signed)
Refill sent per PCP.

## 2020-07-23 ENCOUNTER — Other Ambulatory Visit: Payer: Self-pay | Admitting: Adult Health

## 2020-07-23 DIAGNOSIS — E785 Hyperlipidemia, unspecified: Secondary | ICD-10-CM

## 2020-09-12 ENCOUNTER — Ambulatory Visit: Payer: Medicare Other | Attending: Internal Medicine

## 2020-09-12 DIAGNOSIS — Z23 Encounter for immunization: Secondary | ICD-10-CM

## 2020-09-12 NOTE — Progress Notes (Signed)
   Covid-19 Vaccination Clinic  Name:  Billy Hansen    MRN: [id number] DOB: [birth year]  09/12/2020  Billy Hansen was observed post Covid-19 immunization for 15 minutes without incident. He was provided with Vaccine Information Sheet and instruction to access the V-Safe system.   Billy Hansen was instructed to call 911 with any severe reactions post vaccine: Marland Kitchen Difficulty breathing  . Swelling of face and throat  . A fast heartbeat  . A bad rash all over body  . Dizziness and weakness   Immunizations Administered    Name Date Dose VIS Date Route   Pfizer COVID-19 Vaccine 09/12/2020  3:07 PM 0.3 mL 07/06/2020 Intramuscular   Manufacturer: [employer], [employer]   Lot: [id number]   NDC: [address]-4

## 2020-09-28 DIAGNOSIS — E119 Type 2 diabetes mellitus without complications: Secondary | ICD-10-CM | POA: Diagnosis not present

## 2020-09-28 DIAGNOSIS — H2513 Age-related nuclear cataract, bilateral: Secondary | ICD-10-CM | POA: Diagnosis not present

## 2020-09-28 DIAGNOSIS — H5203 Hypermetropia, bilateral: Secondary | ICD-10-CM | POA: Diagnosis not present

## 2020-09-28 LAB — HM DIABETES EYE EXAM

## 2020-10-07 ENCOUNTER — Other Ambulatory Visit: Payer: Self-pay

## 2020-10-11 ENCOUNTER — Encounter: Payer: Self-pay | Admitting: Internal Medicine

## 2020-10-11 ENCOUNTER — Other Ambulatory Visit: Payer: Self-pay

## 2020-10-11 ENCOUNTER — Ambulatory Visit (INDEPENDENT_AMBULATORY_CARE_PROVIDER_SITE_OTHER): Payer: Medicare Other | Admitting: Internal Medicine

## 2020-10-11 VITALS — BP 140/90 | HR 93 | Ht 70.0 in | Wt 227.2 lb

## 2020-10-11 DIAGNOSIS — E669 Obesity, unspecified: Secondary | ICD-10-CM | POA: Diagnosis not present

## 2020-10-11 DIAGNOSIS — E1121 Type 2 diabetes mellitus with diabetic nephropathy: Secondary | ICD-10-CM | POA: Diagnosis not present

## 2020-10-11 DIAGNOSIS — E785 Hyperlipidemia, unspecified: Secondary | ICD-10-CM | POA: Diagnosis not present

## 2020-10-11 MED ORDER — DAPAGLIFLOZIN PROPANEDIOL 10 MG PO TABS
10.0000 mg | ORAL_TABLET | Freq: Every day | ORAL | 3 refills | Status: DC
Start: 2020-10-11 — End: 2021-10-25

## 2020-10-11 MED ORDER — METFORMIN HCL 1000 MG PO TABS: 1000.0000 mg | ORAL_TABLET | Freq: Two times a day (BID) | ORAL | 3 refills | Status: DC

## 2020-10-11 NOTE — Patient Instructions (Signed)
Please continue: - Metformin 1000 mg 2x a day with meals - Farxiga 10 mg before b'fast - Rybelsus 7 mg before b'fast  Please stop at the lab.  Please return in 4 months with your sugar log.
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
## 2020-10-14 LAB — FRUCTOSAMINE: Fructosamine: 286 umol/L — ABNORMAL HIGH (ref 205–285)

## 2020-11-16 ENCOUNTER — Encounter: Payer: Self-pay | Admitting: Adult Health

## 2020-11-25 ENCOUNTER — Ambulatory Visit: Payer: Medicare Other | Admitting: Adult Health

## 2021-01-19 ENCOUNTER — Telehealth: Payer: Self-pay | Admitting: Adult Health

## 2021-01-19 MED ORDER — SILDENAFIL CITRATE 20 MG PO TABS
ORAL_TABLET | ORAL | 0 refills | Status: DC
Start: 2021-01-19 — End: 2021-04-18

## 2021-01-19 NOTE — Telephone Encounter (Signed)
Rx done. 

## 2021-01-19 NOTE — Telephone Encounter (Signed)
Pt is calling in needing a refill on Rx sildenafil (REVATIO) 20 MG  Pharm:  [facility]

## 2021-02-08 ENCOUNTER — Encounter: Payer: Self-pay | Admitting: Internal Medicine

## 2021-02-08 ENCOUNTER — Ambulatory Visit (INDEPENDENT_AMBULATORY_CARE_PROVIDER_SITE_OTHER): Payer: Medicare Other | Admitting: Internal Medicine

## 2021-02-08 ENCOUNTER — Other Ambulatory Visit: Payer: Self-pay

## 2021-02-08 VITALS — BP 128/88 | HR 76 | Ht 70.0 in | Wt 228.4 lb

## 2021-02-08 DIAGNOSIS — E1121 Type 2 diabetes mellitus with diabetic nephropathy: Secondary | ICD-10-CM

## 2021-02-08 DIAGNOSIS — E785 Hyperlipidemia, unspecified: Secondary | ICD-10-CM

## 2021-02-08 DIAGNOSIS — E669 Obesity, unspecified: Secondary | ICD-10-CM
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
## 2021-02-08 NOTE — Patient Instructions (Signed)
Please continue: - Metformin 1000 mg 2x a day with meals - Farxiga 10 mg before b'fast - Rybelsus 7 mg before b'fast  Please stop at the lab.  Please return in 4 months with your sugar log.

## 2021-02-10 ENCOUNTER — Other Ambulatory Visit: Payer: Self-pay | Admitting: Internal Medicine

## 2021-02-10 DIAGNOSIS — E1121 Type 2 diabetes mellitus with diabetic nephropathy: Secondary | ICD-10-CM

## 2021-02-12 LAB — FRUCTOSAMINE: Fructosamine: 333 umol/L — ABNORMAL HIGH (ref 205–285)

## 2021-02-14 ENCOUNTER — Encounter: Payer: Self-pay | Admitting: Internal Medicine

## 2021-04-02 ENCOUNTER — Other Ambulatory Visit: Payer: Self-pay | Admitting: Internal Medicine

## 2021-04-02 DIAGNOSIS — E1121 Type 2 diabetes mellitus with diabetic nephropathy: Secondary | ICD-10-CM

## 2021-04-03 ENCOUNTER — Other Ambulatory Visit: Payer: Self-pay

## 2021-04-04 ENCOUNTER — Ambulatory Visit (INDEPENDENT_AMBULATORY_CARE_PROVIDER_SITE_OTHER): Payer: Medicare Other | Admitting: Adult Health

## 2021-04-04 ENCOUNTER — Encounter: Payer: Self-pay | Admitting: Adult Health

## 2021-04-04 VITALS — BP 120/88 | HR 76 | Temp 98.1°F | Ht 70.0 in | Wt 227.0 lb

## 2021-04-04 DIAGNOSIS — E1122 Type 2 diabetes mellitus with diabetic chronic kidney disease: Secondary | ICD-10-CM

## 2021-04-04 DIAGNOSIS — N529 Male erectile dysfunction, unspecified: Secondary | ICD-10-CM

## 2021-04-04 DIAGNOSIS — N183 Chronic kidney disease, stage 3 unspecified: Secondary | ICD-10-CM | POA: Diagnosis not present

## 2021-04-04 DIAGNOSIS — Z125 Encounter for screening for malignant neoplasm of prostate: Secondary | ICD-10-CM

## 2021-04-04 DIAGNOSIS — Z0000 Encounter for general adult medical examination without abnormal findings: Secondary | ICD-10-CM

## 2021-04-04 DIAGNOSIS — E785 Hyperlipidemia, unspecified: Secondary | ICD-10-CM

## 2021-04-04 DIAGNOSIS — I10 Essential (primary) hypertension: Secondary | ICD-10-CM

## 2021-04-04 DIAGNOSIS — K219 Gastro-esophageal reflux disease without esophagitis: Secondary | ICD-10-CM

## 2021-04-04 LAB — COMPREHENSIVE METABOLIC PANEL
ALT: 25 U/L (ref 0–53)
AST: 16 U/L (ref 0–37)
Albumin: 4.6 g/dL (ref 3.5–5.2)
Alkaline Phosphatase: 60 U/L (ref 39–117)
BUN: 16 mg/dL (ref 6–23)
CO2: 25 mEq/L (ref 19–32)
Calcium: 9.5 mg/dL (ref 8.4–10.5)
Chloride: 107 mEq/L (ref 96–112)
Creatinine, Ser: 1.09 mg/dL (ref 0.40–1.50)
GFR: 67.37 mL/min (ref >60.00)
Glucose, Bld: 140 mg/dL — ABNORMAL HIGH (ref 70–99)
Potassium: 4.3 mEq/L (ref 3.5–5.1)
Sodium: 142 mEq/L (ref 135–145)
Total Bilirubin: 1.1 mg/dL (ref 0.2–1.2)
Total Protein: 6.6 g/dL (ref 6.0–8.3)

## 2021-04-04 LAB — LIPID PANEL
Cholesterol: 134 mg/dL (ref 0–200)
HDL: 41.9 mg/dL (ref >39.00)
LDL Cholesterol: 69 mg/dL (ref 0–99)
NonHDL: 91.94
Total CHOL/HDL Ratio: 3
Triglycerides: 116 mg/dL (ref 0.0–149.0)
VLDL: 23.2 mg/dL (ref 0.0–40.0)

## 2021-04-04 LAB — CBC WITH DIFFERENTIAL/PLATELET
Basophils Absolute: 0 10*3/uL (ref 0.0–0.1)
Basophils Relative: 0.6 % (ref 0.0–3.0)
Eosinophils Absolute: 0.3 10*3/uL (ref 0.0–0.7)
Eosinophils Relative: 5.5 % — ABNORMAL HIGH (ref 0.0–5.0)
HCT: 48.5 % (ref 39.0–52.0)
Hemoglobin: 16.3 g/dL (ref 13.0–17.0)
Lymphocytes Relative: 38.3 % (ref 12.0–46.0)
Lymphs Abs: 1.8 10*3/uL (ref 0.7–4.0)
MCHC: 33.6 g/dL (ref 30.0–36.0)
MCV: 87.2 fl (ref 78.0–100.0)
Monocytes Absolute: 0.4 10*3/uL (ref 0.1–1.0)
Monocytes Relative: 9 % (ref 3.0–12.0)
Neutro Abs: 2.2 10*3/uL (ref 1.4–7.7)
Neutrophils Relative %: 46.6 % (ref 43.0–77.0)
Platelets: 136 10*3/uL — ABNORMAL LOW (ref 150.0–400.0)
RBC: 5.57 Mil/uL (ref 4.22–5.81)
RDW: 13.8 % (ref 11.5–15.5)
WBC: 4.6 10*3/uL (ref 4.0–10.5)

## 2021-04-04 LAB — PSA: PSA: 1.01 ng/mL (ref 0.10–4.00)

## 2021-04-04 LAB — HEMOGLOBIN A1C: Hgb A1c MFr Bld: 7.6 % — ABNORMAL HIGH (ref 4.6–6.5)

## 2021-04-04 LAB — TSH: TSH: 0.44 u[IU]/mL (ref 0.35–5.50)

## 2021-04-04 NOTE — Addendum Note (Signed)
Addended by: Bonnye Fava on: 04/04/2021 09:48 AM   Modules accepted: Orders

## 2021-04-04 NOTE — Progress Notes (Signed)
Subjective:    Patient ID: Billy Hansen, male    DOB: 1946-03-14, 75 y.o.   MRN: [id number]  HPI Patient presents for yearly preventative medicine examination. He is a pleasant 75 year old male who  has a past medical history of Allergy, Arthritis, Diabetes mellitus without complication ([place]), GERD (gastroesophageal reflux disease), Hyperlipidemia, and Hypertension.  DM - Is managed by endocrinology.  Currently prescribed Farxiga 10 mg daily, Rybelsus 7 mg in the am,  and metformin 1000 mg twice daily.  Was last seen in May 2022. His A1c is checked via Fructosamine   Hypertension -controlled with Lasix 20 mg daily and lisinopril 40 mg daily.  He denies chest pain, shortness of breath, headaches, dizziness, blurred vision, or syncopal episodes  BP Readings from Last 3 Encounters:  04/04/21 120/88  02/08/21 128/88  10/11/20 140/90   Hyperlipidemia -takes Zocor 40 and 81 mg aspirin daily Lab Results  Component Value Date   CHOL 151 04/01/2020   HDL 41 04/01/2020   LDLCALC 83 04/01/2020   LDLDIRECT 149.4 07/12/2014   TRIG 168 (H) 04/01/2020   CHOLHDL 3.7 04/01/2020   ED - uses Viagra PRN   GERD - Controlled with prilosec 20 mg   All immunizations and health maintenance protocols were reviewed with the patient and needed orders were placed.  Appropriate screening laboratory values were ordered for the patient including screening of hyperlipidemia, renal function and hepatic function. If indicated by BPH, a PSA was ordered.  Medication reconciliation,  past medical history, social history, problem list and allergies were reviewed in detail with the patient  Goals were established with regard to weight loss, exercise, and  diet in compliance with medications. He stays active and tries to eat a healthy diet.   Wt Readings from Last 3 Encounters:  04/04/21 227 lb (103 kg)  02/08/21 228 lb 6.4 oz (103.6 kg)  10/11/20 227 lb 3.2 oz (103.1 kg)   End of life planning was  discussed.   Review of Systems  Constitutional: Negative.   HENT: Negative.    Eyes: Negative.   Respiratory: Negative.    Cardiovascular: Negative.   Gastrointestinal: Negative.   Endocrine: Negative.   Genitourinary: Negative.   Musculoskeletal: Negative.   Skin: Negative.   Allergic/Immunologic: Negative.   Neurological: Negative.   Hematological: Negative.   Psychiatric/Behavioral: Negative.    All other systems reviewed and are negative.  Past Medical History:  Diagnosis Date   Allergy    Arthritis    Diabetes mellitus without complication (HCC)    GERD (gastroesophageal reflux disease)    Hyperlipidemia    Hypertension     Social History   Socioeconomic History   Marital status: Married    Spouse name: Not on file   Number of children: Not on file   Years of education: Not on file   Highest education level: Not on file  Occupational History   Not on file  Tobacco Use   Smoking status: Never   Smokeless tobacco: Never  Vaping Use   Vaping Use: Never used  Substance and Sexual Activity   Alcohol use: No   Drug use: No   Sexual activity: Yes    Partners: Female  Other Topics Concern   Not on file  Social History Narrative   Regular exercise: walk   Caffeine use: 1 cup of coffee         Social Determinants of Health   Financial Resource Strain: Low Risk  Difficulty of Paying Living Expenses: Not hard at all  Food Insecurity: No Food Insecurity   Worried About [place] in the Last Year: Never true   Ran Out of Food in the Last Year: Never true  Transportation Needs: No Transportation Needs   Lack of Transportation (Medical): No   Lack of Transportation (Non-Medical): No  Physical Activity: Inactive   Days of Exercise per Week: 0 days   Minutes of Exercise per Session: 0 min  Stress: No Stress Concern Present   Feeling of Stress : Not at all  Social Connections: Moderately Integrated   Frequency of Communication with Friends and  Family: Three times a week   Frequency of Social Gatherings with Friends and Family: More than three times a week   Attends Religious Services: More than 4 times per year   Active Member of [employer] or Organizations: No   Attends Archivist Meetings: Never   Marital Status: Married  Human resources officer Violence: Not At Risk   Fear of Current or Ex-Partner: No   Emotionally Abused: No   Physically Abused: No   Sexually Abused: No    Past Surgical History:  Procedure Laterality Date   COLON SURGERY  2005   due to injury at work per pt.    COLON SURGERY  2006   revision of ostomy bag   COLONOSCOPY  2006, 2017   KNEE SURGERY     right knee    Family History  Problem Relation Age of Onset   Cancer Mother    Diabetes Brother    Colon cancer Neg Hx     Allergies  Allergen Reactions   Januvia [Sitagliptin] Other (See Comments)    HA, fatigue   Naproxen Sodium Itching   Other     Some type of anesthetic following colonoscopy/ notes states Propofol    Current Outpatient Medications on File Prior to Visit  Medication Sig Dispense Refill   ACCU-CHEK GUIDE test strip TEST ONCE DAILY 100 strip 3   Accu-Chek Softclix Lancets lancets 1 each by Other route 3 (three) times daily. E11.9 100 each 2   aspirin 81 MG tablet Take 81 mg by mouth daily.     Blood Glucose Monitoring Suppl (ACCU-CHEK GUIDE) w/Device KIT 1 each by Does not apply route 3 (three) times daily. E11.9 1 kit 0   dapagliflozin propanediol (FARXIGA) 10 MG TABS tablet Take 1 tablet (10 mg total) by mouth daily before breakfast. 90 tablet 3   lisinopril (ZESTRIL) 20 MG tablet TAKE 2 TABLETS BY MOUTH IN  THE MORNING 180 tablet 3   metFORMIN (GLUCOPHAGE) 1000 MG tablet Take 1 tablet (1,000 mg total) by mouth 2 (two) times daily with a meal. 180 tablet 3   RYBELSUS 7 MG TABS TAKE 1 TABLET BY MOUTH  DAILY 90 tablet 3   sildenafil (REVATIO) 20 MG tablet TAKE 1 TO 2 TABELTS BY MOUTH 2 HOURS PRIOR TO SEX 30 tablet 0    simvastatin (ZOCOR) 40 MG tablet TAKE 1 TABLET BY MOUTH AT  BEDTIME 90 tablet 3   No current facility-administered medications on file prior to visit.    BP 120/88   Pulse 76   Temp 98.1 F (36.7 C) (Oral)   Ht _0  (1.778 m)   Wt 227 lb (103 kg)   SpO2 96%   BMI 32.57 kg/m       Objective:   Physical Exam Vitals and nursing note reviewed.  Constitutional:  General: He is not in acute distress.    Appearance: Normal appearance. He is well-developed and normal weight.  HENT:     Head: Normocephalic and atraumatic.     Right Ear: Tympanic membrane, ear canal and external ear normal. There is no impacted cerumen.     Left Ear: Tympanic membrane, ear canal and external ear normal. There is no impacted cerumen.     Nose: Nose normal. No congestion or rhinorrhea.     Mouth/Throat:     Mouth: Mucous membranes are moist.     Pharynx: Oropharynx is clear. No oropharyngeal exudate or posterior oropharyngeal erythema.  Eyes:     General:        Right eye: No discharge.        Left eye: No discharge.     Extraocular Movements: Extraocular movements intact.     Conjunctiva/sclera: Conjunctivae normal.     Pupils: Pupils are equal, round, and reactive to light.  Neck:     Vascular: No carotid bruit.     Trachea: No tracheal deviation.  Cardiovascular:     Rate and Rhythm: Normal rate and regular rhythm.     Pulses: Normal pulses.     Heart sounds: Normal heart sounds. No murmur heard.   No friction rub. No gallop.  Pulmonary:     Effort: Pulmonary effort is normal. No respiratory distress.     Breath sounds: Normal breath sounds. No stridor. No wheezing, rhonchi or rales.  Chest:     Chest wall: No tenderness.  Abdominal:     General: Bowel sounds are normal. There is no distension.     Palpations: Abdomen is soft. There is no mass.     Tenderness: There is no abdominal tenderness. There is no right CVA tenderness, left CVA tenderness, guarding or rebound.     Hernia:  No hernia is present.  Musculoskeletal:        General: No swelling, tenderness, deformity or signs of injury. Normal range of motion.     Right lower leg: No edema.     Left lower leg: No edema.  Lymphadenopathy:     Cervical: No cervical adenopathy.  Skin:    General: Skin is warm and dry.     Capillary Refill: Capillary refill takes less than 2 seconds.     Coloration: Skin is not jaundiced or pale.     Findings: No bruising, erythema, lesion or rash.  Neurological:     General: No focal deficit present.     Mental Status: He is alert and oriented to person, place, and time.     Cranial Nerves: No cranial nerve deficit.     Sensory: No sensory deficit.     Motor: No weakness.     Coordination: Coordination normal.     Gait: Gait normal.     Deep Tendon Reflexes: Reflexes normal.  Psychiatric:        Mood and Affect: Mood normal.        Behavior: Behavior normal.        Thought Content: Thought content normal.        Judgment: Judgment normal.      Assessment & Plan:  1. Routine general medical examination at a health care facility - Continue with weight los measures - Follow up in one year or sooner if needed - CBC with Differential/Platelet; Future - Comprehensive metabolic panel; Future - Hemoglobin A1c; Future - Lipid panel; Future - TSH; Future  2. Essential hypertension -  Well controlled.   No change in medication  - CBC with Differential/Platelet; Future - Comprehensive metabolic panel; Future - Hemoglobin A1c; Future - Lipid panel; Future - TSH; Future  3. Controlled type 2 diabetes mellitus with stage 3 chronic kidney disease, without long-term current use of insulin ([place]) - Follow up with endocrinology as directed - CBC with Differential/Platelet; Future - Comprehensive metabolic panel; Future - Hemoglobin A1c; Future - Lipid panel; Future - TSH; Future  4. ERECTILE DYSFUNCTION, ORGANIC - Continue with Viagra PRN  - CBC with Differential/Platelet;  Future - Comprehensive metabolic panel; Future - Hemoglobin A1c; Future - Lipid panel; Future - TSH; Future  5. Hyperlipidemia, unspecified hyperlipidemia type - Consider increase in statin  - CBC with Differential/Platelet; Future - Comprehensive metabolic panel; Future - Hemoglobin A1c; Future - Lipid panel; Future - TSH; Future  6. Gastroesophageal reflux disease, unspecified whether esophagitis present - Continue with Prilosec  - CBC with Differential/Platelet; Future - Comprehensive metabolic panel; Future - Hemoglobin A1c; Future - Lipid panel; Future - TSH; Future  7. Prostate cancer screening  - PSA; Future  Dorothyann Peng, NP

## 2021-04-04 NOTE — Patient Instructions (Signed)
Health Maintenance Due  Topic Date Due   Zoster Vaccines- Shingrix (1 of 2) Never done   HEMOGLOBIN A1C  12/06/2020   COVID-19 Vaccine (4 - Booster for Pfizer series) 01/11/2021   FOOT EXAM  04/01/2021    Depression screen PHQ 2/9 05/13/2020 04/01/2020 04/01/2020  Decreased Interest 0 0 0  Down, Depressed, Hopeless 0 0 0  PHQ - 2 Score 0 0 0  Altered sleeping 0 - -  Tired, decreased energy 0 - -  Change in appetite 0 - -  Feeling bad or failure about yourself  0 - -  Trouble concentrating 0 - -  Moving slowly or fidgety/restless 0 - -  Suicidal thoughts 0 - -  PHQ-9 Score 0 - -  Difficult doing work/chores Not difficult at all - -

## 2021-04-05 ENCOUNTER — Other Ambulatory Visit: Payer: Self-pay | Admitting: Adult Health

## 2021-04-05 DIAGNOSIS — I10 Essential (primary) hypertension: Secondary | ICD-10-CM

## 2021-04-14 ENCOUNTER — Other Ambulatory Visit: Payer: Self-pay | Admitting: Adult Health

## 2021-04-14 NOTE — Telephone Encounter (Signed)
sildenafil (REVATIO) 20 MG tablet  [id number] - [place], [place] - [address] Phone:  [phone]  Fax:  [phone]      He has about 2 days left

## 2021-04-19 ENCOUNTER — Encounter: Payer: Self-pay | Admitting: Adult Health

## 2021-04-20 NOTE — Telephone Encounter (Signed)
Please advise 

## 2021-04-25 ENCOUNTER — Other Ambulatory Visit: Payer: Self-pay | Admitting: Adult Health

## 2021-04-25 MED ORDER — SILDENAFIL CITRATE 20 MG PO TABS
ORAL_TABLET | ORAL | 9 refills | Status: DC
Start: 2021-04-25 — End: 2022-06-26

## 2021-05-15 ENCOUNTER — Telehealth: Payer: Self-pay | Admitting: Adult Health

## 2021-05-15 NOTE — Telephone Encounter (Signed)
Left message for patient to call back and schedule Medicare Annual Wellness Visit (AWV) either virtually or in office. Left  my Billy Hansen number [phone]   Last AWV ;05/13/20  please schedule at anytime with [facility] Nurse Health Advisor 1 or 2   This should be a 45 minute visit.

## 2021-05-25 ENCOUNTER — Ambulatory Visit (INDEPENDENT_AMBULATORY_CARE_PROVIDER_SITE_OTHER): Payer: Medicare Other

## 2021-05-25 ENCOUNTER — Other Ambulatory Visit: Payer: Self-pay

## 2021-05-25 VITALS — BP 124/78 | HR 75 | Temp 98.2°F | Ht 71.0 in | Wt 227.0 lb

## 2021-05-25 DIAGNOSIS — Z0000 Encounter for general adult medical examination without abnormal findings: Secondary | ICD-10-CM | POA: Diagnosis not present

## 2021-05-25 NOTE — Progress Notes (Signed)
 Subjective:   Billy Hansen is a 75 y.o. male who presents for an Subsequent Medicare Annual Wellness Visit.  Review of Systems    N/a Cardiac Risk Factors include: advanced age (>55men, >65 women);diabetes mellitus;dyslipidemia;hypertension;male gender     Objective:    Today's Vitals   05/25/21 1434  BP: 124/78  Pulse: 75  Temp: 98.2 F (36.8 C)  SpO2: 94%  Weight: 227 lb (103 kg)  Height: 5' 11" (1.803 m)   Body mass index is 31.66 kg/m.  Advanced Directives 05/25/2021 05/13/2020 04/28/2018 01/18/2016 05/17/2015  Does Patient Have a Medical Advance Directive? No No No No No  Would patient like information on creating a medical advance directive? No - Patient declined No - Patient declined - - No - patient declined information    Current Medications (verified) Outpatient Encounter Medications as of 05/25/2021  Medication Sig   ACCU-CHEK GUIDE test strip TEST ONCE DAILY   Accu-Chek Softclix Lancets lancets 1 each by Other route 3 (three) times daily. E11.9   aspirin 81 MG tablet Take 81 mg by mouth daily.   Blood Glucose Monitoring Suppl (ACCU-CHEK GUIDE) w/Device KIT 1 each by Does not apply route 3 (three) times daily. E11.9   dapagliflozin propanediol (FARXIGA) 10 MG TABS tablet Take 1 tablet (10 mg total) by mouth daily before breakfast.   lisinopril (ZESTRIL) 20 MG tablet TAKE 2 TABLETS BY MOUTH IN  THE MORNING   metFORMIN (GLUCOPHAGE) 1000 MG tablet Take 1 tablet (1,000 mg total) by mouth 2 (two) times daily with a meal.   RYBELSUS 7 MG TABS TAKE 1 TABLET BY MOUTH  DAILY   sildenafil (REVATIO) 20 MG tablet TAKE ONE TO TWO TABLETS BY MOUTH TWO HOURS PRIOR TO SEX   simvastatin (ZOCOR) 40 MG tablet TAKE 1 TABLET BY MOUTH AT  BEDTIME   No facility-administered encounter medications on file as of 05/25/2021.    Allergies (verified) Januvia [sitagliptin], Naproxen sodium, and Other   History: Past Medical History:  Diagnosis Date   Allergy    Arthritis    Diabetes  mellitus without complication (HCC)    GERD (gastroesophageal reflux disease)    Hyperlipidemia    Hypertension    Past Surgical History:  Procedure Laterality Date   COLON SURGERY  2005   due to injury at work per pt.    COLON SURGERY  2006   revision of ostomy bag   COLONOSCOPY  2006, 2017   KNEE SURGERY     right knee   Family History  Problem Relation Age of Onset   Cancer Mother    Diabetes Brother    Colon cancer Neg Hx    Social History   Socioeconomic History   Marital status: Married    Spouse name: Not on file   Number of children: Not on file   Years of education: Not on file   Highest education level: Not on file  Occupational History   Not on file  Tobacco Use   Smoking status: Never   Smokeless tobacco: Never  Vaping Use   Vaping Use: Never used  Substance and Sexual Activity   Alcohol use: No   Drug use: No   Sexual activity: Yes    Partners: Female  Other Topics Concern   Not on file  Social History Narrative   Regular exercise: walk   Caffeine use: 1 cup of coffee         Social Determinants of Health   Financial Resource   Strain: Low Risk    Difficulty of Paying Living Expenses: Not hard at all  Food Insecurity: No Food Insecurity   Worried About Running Out of Food in the Last Year: Never true   Ran Out of Food in the Last Year: Never true  Transportation Needs: No Transportation Needs   Lack of Transportation (Medical): No   Lack of Transportation (Non-Medical): No  Physical Activity: Sufficiently Active   Days of Exercise per Week: 3 days   Minutes of Exercise per Session: 60 min  Stress: No Stress Concern Present   Feeling of Stress : Not at all  Social Connections: Moderately Integrated   Frequency of Communication with Friends and Family: Three times a week   Frequency of Social Gatherings with Friends and Family: Three times a week   Attends Religious Services: More than 4 times per year   Active Member of Clubs or  Organizations: No   Attends Club or Organization Meetings: Never   Marital Status: Married    Tobacco Counseling Counseling given: Not Answered   Clinical Intake:  Pre-visit preparation completed: Yes  Pain : No/denies pain     Nutritional Risks: None Diabetes: Yes CBG done?: No Did pt. bring in CBG monitor from home?: No  How often do you need to have someone help you when you read instructions, pamphlets, or other written materials from your doctor or pharmacy?: 1 - Never What is the last grade level you completed in school?: 9th grade  Diabetic?yes Nutrition Risk Assessment:  Has the patient had any N/V/D within the last 2 months?  No  Does the patient have any non-healing wounds?  No  Has the patient had any unintentional weight loss or weight gain?  No   Diabetes:  Is the patient diabetic?  Yes  If diabetic, was a CBG obtained today?  No  Did the patient bring in their glucometer from home?  No  How often do you monitor your CBG's? daily.   Financial Strains and Diabetes Management:  Are you having any financial strains with the device, your supplies or your medication? No .  Does the patient want to be seen by Chronic Care Management for management of their diabetes?  No  Would the patient like to be referred to a Nutritionist or for Diabetic Management?  No   Diabetic Exams:  Diabetic Eye Exam: Completed 09/2019 Diabetic Foot Exam: Overdue, Pt has been advised about the importance in completing this exam. Pt is scheduled for diabetic foot exam on next office visit .   Interpreter Needed?: No  Information entered by :: L.Viers,LPN   Activities of Daily Living In your present state of health, do you have any difficulty performing the following activities: 05/25/2021  Hearing? N  Vision? N  Difficulty concentrating or making decisions? N  Walking or climbing stairs? N  Dressing or bathing? N  Doing errands, shopping? N  Preparing Food and eating ? N   Using the Toilet? N  In the past six months, have you accidently leaked urine? N  Do you have problems with loss of bowel control? N  Managing your Medications? N  Managing your Finances? N  Housekeeping or managing your Housekeeping? N  Some recent data might be hidden    Patient Care Team: Nafziger, Cory, NP as PCP - General (Family Medicine)  Indicate any recent Medical Services you may have received from other than [facility] providers in the past year (date may be approximate).       Assessment:   This is a routine wellness examination for Billy Hansen.  Hearing/Vision screen No results found.  Dietary issues and exercise activities discussed: Current Exercise Habits: Home exercise routine, Type of exercise: walking, Time (Minutes): 30, Frequency (Times/Week): 3, Weekly Exercise (Minutes/Week): 90, Intensity: Mild, Exercise limited by: None identified   Goals Addressed   None    Depression Screen PHQ 2/9 Scores 05/25/2021 05/25/2021 04/04/2021 05/13/2020 04/01/2020 04/01/2020 04/28/2018  PHQ - 2 Score 0 0 0 0 0 0 0  PHQ- 9 Score - - - 0 - - -    Fall Risk Fall Risk  05/25/2021 05/13/2020 04/01/2020 04/01/2020 04/28/2018  Falls in the past year? 0 _0 No  Number falls in past yr: 0 _1 -  Injury with Fall? 0 0 0 0 -  Risk for fall due to : No Fall Risks Medication side effect;History of fall(s) - - -  Follow up Falls evaluation completed Falls evaluation completed;Falls prevention discussed - - -    FALL RISK PREVENTION PERTAINING TO THE HOME:  Any stairs in or around the home? No  If so, are there any without handrails? Yes  Home free of loose throw rugs in walkways, pet beds, electrical cords, etc? Yes  Adequate lighting in your home to reduce risk of falls? Yes   ASSISTIVE DEVICES UTILIZED TO PREVENT FALLS:  Life alert? No  Use of a cane, walker or w/c? No  Grab bars in the bathroom? Yes  Shower chair or bench in shower? Yes  Elevated toilet seat or a handicapped toilet? Yes    TIMED UP AND GO:  Was the test performed? Yes .  Length of time to ambulate 10 feet: 7 sec.   Gait steady and fast without use of assistive device  Cognitive Function: Normal cognitive status assessed by direct observation by this Nurse Health Advisor. No abnormalities found.   MMSE - Mini Mental State Exam 04/28/2018  Not completed: (No Data)        Immunizations Immunization History  Administered Date(s) Administered   Fluad Quad(high Dose 65+) 08/05/2019   Influenza Whole 06/24/2006, 07/07/2007, 08/03/2009   Influenza, High Dose Seasonal PF 07/18/2016, 09/30/2017   Influenza,inj,Quad PF,6+ Mos 07/27/2013, 08/16/2014, 07/15/2015, 07/30/2018   PFIZER(Purple Top)SARS-COV-2 Vaccination 11/13/2019, 12/09/2019, 09/12/2020   Pneumococcal Conjugate-13 08/16/2014   Pneumococcal Polysaccharide-23 10/29/2012, 04/01/2020   Td 04/02/2006   Tdap 09/25/2016    TDAP status: Up to date  Flu Vaccine status: Up to date  Pneumococcal vaccine status: Up to date  Covid-19 vaccine status: Completed vaccines  Qualifies for Shingles Vaccine? Yes   Zostavax completed No   Shingrix Completed?: No.    Education has been provided regarding the importance of this vaccine. Patient has been advised to call insurance company to determine out of pocket expense if they have not yet received this vaccine. Advised may also receive vaccine at local pharmacy or Health Dept. Verbalized acceptance and understanding.  Screening Tests Health Maintenance  Topic Date Due   Zoster Vaccines- Shingrix (1 of 2) Never done   COVID-19 Vaccine (4 - Booster for Pfizer series) 01/11/2021   INFLUENZA VACCINE  04/17/2021   OPHTHALMOLOGY EXAM  09/28/2021   HEMOGLOBIN A1C  10/05/2021   FOOT EXAM  04/04/2022   COLONOSCOPY (Pts 45-49yr Insurance coverage will need to be confirmed)  01/31/2026   TETANUS/TDAP  09/25/2026   Hepatitis C Screening  Completed   PNA vac Low Risk Adult  Completed   HPV VACCINES  Aged Out     Health Maintenance  Health Maintenance Due  Topic Date Due   Zoster Vaccines- Shingrix (1 of 2) Never done   COVID-19 Vaccine (4 - Booster for Pfizer series) 01/11/2021   INFLUENZA VACCINE  04/17/2021    Colorectal cancer screening: Type of screening: Colonoscopy. Completed 02/01/2016. Repeat every 10 years  Lung Cancer Screening: (Low Dose CT Chest recommended if Age 55-80 years, 30 pack-year currently smoking OR have quit w/in 15years.) does not qualify.   Lung Cancer Screening Referral: n/a  Additional Screening:  Hepatitis C Screening: does not qualify; Completed 09/25/2016  Vision Screening: Recommended annual ophthalmology exams for early detection of glaucoma and other disorders of the eye. Is the patient up to date with their annual eye exam?  Yes  Who is the provider or what is the name of the office in which the patient attends annual eye exams? Dr.Lyles  If pt is not established with a provider, would they like to be referred to a provider to establish care? No .   Dental Screening: Recommended annual dental exams for proper oral hygiene  Community Resource Referral / Chronic Care Management: CRR required this visit?  No   CCM required this visit?  No      Plan:     I have personally reviewed and noted the following in the patient's chart:   Medical and social history Use of alcohol, tobacco or illicit drugs  Current medications and supplements including opioid prescriptions. Patient is not currently taking opioid prescriptions. Functional ability and status Nutritional status Physical activity Advanced directives List of other physicians Hospitalizations, surgeries, and ER visits in previous 12 months Vitals Screenings to include cognitive, depression, and falls Referrals and appointments  In addition, I have reviewed and discussed with patient certain preventive protocols, quality metrics, and best practice recommendations. A written personalized  care plan for preventive services as well as general preventive health recommendations were provided to patient.     Laura Viers, LPN   05/25/2021   Nurse Notes: none       

## 2021-05-25 NOTE — Patient Instructions (Signed)
[name] , Thank you for taking time to come for your Medicare Wellness Visit. I appreciate your ongoing commitment to your health goals. Please review the following plan we discussed and let me know if I can assist you in the future.   Screening recommendations/referrals: Colonoscopy: 02/01/2016  due 2027 Recommended yearly ophthalmology/optometry visit for glaucoma screening and checkup Recommended yearly dental visit for hygiene and checkup  Vaccinations: Influenza vaccine: due in fall 2022  Pneumococcal vaccine: completed series  Tdap vaccine: 09/25/2016 Shingles vaccine: will consider     Advanced directives: none   Conditions/risks identified: none   Next appointment: CPE 04/05/2022  0900am Billy Hansen   Preventive Care 73 Years and Older, Male Preventive care refers to lifestyle choices and visits with your health care provider that can promote health and wellness. What does preventive care include? A yearly physical exam. This is also called an annual well check. Dental exams once or twice a year. Routine eye exams. Ask your health care provider how often you should have your eyes checked. Personal lifestyle choices, including: Daily care of your teeth and gums. Regular physical activity. Eating a healthy diet. Avoiding tobacco and drug use. Limiting alcohol use. Practicing safe sex. Taking low doses of aspirin every day. Taking vitamin and mineral supplements as recommended by your health care provider. What happens during an annual well check? The services and screenings done by your health care provider during your annual well check will depend on your age, overall health, lifestyle risk factors, and family history of disease. Counseling  Your health care provider may ask you questions about your: Alcohol use. Tobacco use. Drug use. Emotional well-being. Home and relationship well-being. Sexual activity. Eating habits. History of falls. Memory and  ability to understand (cognition). Work and work Astronomer. Screening  You may have the following tests or measurements: Height, weight, and BMI. Blood pressure. Lipid and cholesterol levels. These may be checked every 5 years, or more frequently if you are over 53 years old. Skin check. Lung cancer screening. You may have this screening every year starting at age 73 if you have a 30-pack-year history of smoking and currently smoke or have quit within the past 15 years. Fecal occult blood test (FOBT) of the stool. You may have this test every year starting at age 25. Flexible sigmoidoscopy or colonoscopy. You may have a sigmoidoscopy every 5 years or a colonoscopy every 10 years starting at age 39. Prostate cancer screening. Recommendations will vary depending on your family history and other risks. Hepatitis C blood test. Hepatitis B blood test. Sexually transmitted disease (STD) testing. Diabetes screening. This is done by checking your blood sugar (glucose) after you have not eaten for a while (fasting). You may have this done every 1-3 years. Abdominal aortic aneurysm (AAA) screening. You may need this if you are a current or former smoker. Osteoporosis. You may be screened starting at age 17 if you are at high risk. Talk with your health care provider about your test results, treatment options, and if necessary, the need for more tests. Vaccines  Your health care provider may recommend certain vaccines, such as: Influenza vaccine. This is recommended every year. Tetanus, diphtheria, and acellular pertussis (Tdap, Td) vaccine. You may need a Td booster every 10 years. Zoster vaccine. You may need this after age 49. Pneumococcal 13-valent conjugate (PCV13) vaccine. One dose is recommended after age 15. Pneumococcal polysaccharide (PPSV23) vaccine. One dose is recommended after age 21. Talk to your health care  provider about which screenings and vaccines you need and how often you need  them. This information is not intended to replace advice given to you by your health care provider. Make sure you discuss any questions you have with your health care provider. Document Released: 09/30/2015 Document Revised: 05/23/2016 Document Reviewed: 07/05/2015 Elsevier Interactive Patient Education  2017 [employer] Prevention in the Home Falls can cause injuries. They can happen to people of all ages. There are many things you can do to make your home safe and to help prevent falls. What can I do on the outside of my home? Regularly fix the edges of walkways and driveways and fix any cracks. Remove anything that might make you trip as you walk through a door, such as a raised step or threshold. Trim any bushes or trees on the path to your home. Use bright outdoor lighting. Clear any walking paths of anything that might make someone trip, such as rocks or tools. Regularly check to see if handrails are loose or broken. Make sure that both sides of any steps have handrails. Any raised decks and porches should have guardrails on the edges. Have any leaves, snow, or ice cleared regularly. Use sand or salt on walking paths during winter. Clean up any spills in your garage right away. This includes oil or grease spills. What can I do in the bathroom? Use night lights. Install grab bars by the toilet and in the tub and shower. Do not use towel bars as grab bars. Use non-skid mats or decals in the tub or shower. If you need to sit down in the shower, use a plastic, non-slip stool. Keep the floor dry. Clean up any water that spills on the floor as soon as it happens. Remove soap buildup in the tub or shower regularly. Attach bath mats securely with double-sided non-slip rug tape. Do not have throw rugs and other things on the floor that can make you trip. What can I do in the bedroom? Use night lights. Make sure that you have a light by your bed that is easy to reach. Do not use  any sheets or blankets that are too big for your bed. They should not hang down onto the floor. Have a firm chair that has side arms. You can use this for support while you get dressed. Do not have throw rugs and other things on the floor that can make you trip. What can I do in the kitchen? Clean up any spills right away. Avoid walking on wet floors. Keep items that you use a lot in easy-to-reach places. If you need to reach something above you, use a strong step stool that has a grab bar. Keep electrical cords out of the way. Do not use floor polish or wax that makes floors slippery. If you must use wax, use non-skid floor wax. Do not have throw rugs and other things on the floor that can make you trip. What can I do with my stairs? Do not leave any items on the stairs. Make sure that there are handrails on both sides of the stairs and use them. Fix handrails that are broken or loose. Make sure that handrails are as long as the stairways. Check any carpeting to make sure that it is firmly attached to the stairs. Fix any carpet that is loose or worn. Avoid having throw rugs at the top or bottom of the stairs. If you do have throw rugs, attach them to the  floor with carpet tape. Make sure that you have a light switch at the top of the stairs and the bottom of the stairs. If you do not have them, ask someone to add them for you. What else can I do to help prevent falls? Wear shoes that: Do not have high heels. Have rubber bottoms. Are comfortable and fit you well. Are closed at the toe. Do not wear sandals. If you use a stepladder: Make sure that it is fully opened. Do not climb a closed stepladder. Make sure that both sides of the stepladder are locked into place. Ask someone to hold it for you, if possible. Clearly mark and make sure that you can see: Any grab bars or handrails. First and last steps. Where the edge of each step is. Use tools that help you move around (mobility aids)  if they are needed. These include: Canes. Walkers. Scooters. Crutches. Turn on the lights when you go into a dark area. Replace any light bulbs as soon as they burn out. Set up your furniture so you have a clear path. Avoid moving your furniture around. If any of your floors are uneven, fix them. If there are any pets around you, be aware of where they are. Review your medicines with your doctor. Some medicines can make you feel dizzy. This can increase your chance of falling. Ask your doctor what other things that you can do to help prevent falls. This information is not intended to replace advice given to you by your health care provider. Make sure you discuss any questions you have with your health care provider. Document Released: 06/30/2009 Document Revised: 02/09/2016 Document Reviewed: 10/08/2014 Elsevier Interactive Patient Education  2017 [employer].

## 2021-06-15 ENCOUNTER — Ambulatory Visit (INDEPENDENT_AMBULATORY_CARE_PROVIDER_SITE_OTHER): Payer: Medicare Other | Admitting: Internal Medicine

## 2021-06-15 ENCOUNTER — Encounter: Payer: Self-pay | Admitting: Internal Medicine

## 2021-06-15 ENCOUNTER — Other Ambulatory Visit: Payer: Self-pay

## 2021-06-15 VITALS — BP 144/78 | HR 83 | Ht 71.0 in | Wt 228.8 lb

## 2021-06-15 DIAGNOSIS — E785 Hyperlipidemia, unspecified: Secondary | ICD-10-CM

## 2021-06-15 DIAGNOSIS — E669 Obesity, unspecified: Secondary | ICD-10-CM

## 2021-06-15 DIAGNOSIS — Z23 Encounter for immunization: Secondary | ICD-10-CM | POA: Diagnosis not present

## 2021-06-15 DIAGNOSIS — E1121 Type 2 diabetes mellitus with diabetic nephropathy: Secondary | ICD-10-CM | POA: Diagnosis not present

## 2021-06-15 NOTE — Patient Instructions (Addendum)
Please continue: - Metformin 1000 mg 2x a day with meals - Farxiga 10 mg before b'fast - Rybelsus 7 mg before b'fast  Try to stop the evening snack or, if not possible, move it earlier.  Please stop at the lab.  Please return in 4 months with your sugar log.
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
## 2021-06-18 LAB — FRUCTOSAMINE: Fructosamine: 298 umol/L — ABNORMAL HIGH (ref 205–285)

## 2021-06-28 ENCOUNTER — Other Ambulatory Visit: Payer: Self-pay | Admitting: Adult Health

## 2021-06-28 DIAGNOSIS — E785 Hyperlipidemia, unspecified: Secondary | ICD-10-CM

## 2021-10-02 DIAGNOSIS — H5203 Hypermetropia, bilateral: Secondary | ICD-10-CM | POA: Diagnosis not present

## 2021-10-02 DIAGNOSIS — H2513 Age-related nuclear cataract, bilateral: Secondary | ICD-10-CM | POA: Diagnosis not present

## 2021-10-02 DIAGNOSIS — E119 Type 2 diabetes mellitus without complications: Secondary | ICD-10-CM | POA: Diagnosis not present

## 2021-10-02 LAB — HM DIABETES EYE EXAM

## 2021-10-03 ENCOUNTER — Encounter: Payer: Self-pay | Admitting: Adult Health

## 2021-10-04 ENCOUNTER — Other Ambulatory Visit: Payer: Self-pay | Admitting: Internal Medicine

## 2021-10-04 DIAGNOSIS — E1121 Type 2 diabetes mellitus with diabetic nephropathy: Secondary | ICD-10-CM

## 2021-10-16 ENCOUNTER — Encounter: Payer: Self-pay | Admitting: Internal Medicine

## 2021-10-16 ENCOUNTER — Other Ambulatory Visit: Payer: Self-pay

## 2021-10-16 ENCOUNTER — Ambulatory Visit (INDEPENDENT_AMBULATORY_CARE_PROVIDER_SITE_OTHER): Payer: Medicare Other | Admitting: Internal Medicine

## 2021-10-16 VITALS — BP 130/90 | HR 78 | Ht 71.0 in | Wt 224.2 lb

## 2021-10-16 DIAGNOSIS — E669 Obesity, unspecified: Secondary | ICD-10-CM

## 2021-10-16 DIAGNOSIS — E1121 Type 2 diabetes mellitus with diabetic nephropathy: Secondary | ICD-10-CM | POA: Diagnosis not present

## 2021-10-16 DIAGNOSIS — E785 Hyperlipidemia, unspecified: Secondary | ICD-10-CM
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
## 2021-10-16 NOTE — Patient Instructions (Signed)
Please continue: - Metformin 1000 mg 2x a day with meals - Farxiga 10 mg before b'fast - Rybelsus 7 mg before b'fast  Please stop at the lab.  Please return in 4 months with your sugar log.  

## 2021-10-19 LAB — FRUCTOSAMINE: Fructosamine: 294 umol/L — ABNORMAL HIGH (ref 205–285)

## 2021-10-25 ENCOUNTER — Other Ambulatory Visit: Payer: Self-pay | Admitting: Internal Medicine

## 2021-10-31 ENCOUNTER — Encounter: Payer: Self-pay | Admitting: Podiatry

## 2021-10-31 ENCOUNTER — Ambulatory Visit (INDEPENDENT_AMBULATORY_CARE_PROVIDER_SITE_OTHER): Payer: Medicare Other | Admitting: Podiatry

## 2021-10-31 ENCOUNTER — Other Ambulatory Visit: Payer: Self-pay

## 2021-10-31 DIAGNOSIS — E1151 Type 2 diabetes mellitus with diabetic peripheral angiopathy without gangrene: Secondary | ICD-10-CM

## 2021-10-31 DIAGNOSIS — E1169 Type 2 diabetes mellitus with other specified complication: Secondary | ICD-10-CM | POA: Diagnosis not present

## 2021-10-31 DIAGNOSIS — B351 Tinea unguium: Secondary | ICD-10-CM

## 2021-10-31 NOTE — Progress Notes (Signed)
°  Subjective:  Patient ID: Billy Hansen, male    DOB: October 10, 1945,  MRN: [id number]  Chief Complaint  Patient presents with   Nail Problem    I have some ingrowns on the right foot and the nails are thick and discolored and I am a diabetic    76 y.o. male presents with the above complaint. History confirmed with patient. States two of the nails are ingrowing and causing pain. States that the nails are curling in. Nails are thick, he cannot care for them himself due to back issues. Deneis numbness and tingling in hte feet.  Last AM Blood Sugar Reading: unknown Last A1c: 6.7 PCP: [name], NP; last seen last July  Objective:  Physical Exam: warm, good capillary refill, nail exam normal nails without lesions, no trophic changes or ulcerative lesions. DP pulses palpable and protective sensation intact. Non-palpable PT pulses. POP right 2nd toe lateral nail border with hyperkeratosis and mild ingrown nail. Mild ingrowing nail right hallux.   Hgb A1c MFr Bld  Date Value Ref Range Status  04/04/2021 7.6 (H) 4.6 - 6.5 % Final    Comment:    Glycemic Control Guidelines for People with Diabetes:Non Diabetic:  <6%Goal of Therapy: <7%Additional Action Suggested:  >8%     No images are attached to the encounter.  Assessment:   1. Onychomycosis of multiple toenails with type 2 diabetes mellitus and peripheral angiopathy (HCC)      Plan:  Patient was evaluated and treated and all questions answered.  Onychomycosis -Patient is diabetic with a qualifying condition for at risk foot care.  Procedure: Nail Debridement Type of Debridement: manual, sharp debridement. Instrumentation: Nail nipper, rotary burr. Number of Nails: 2 Disposition: Patient tolerated well without iatrogenic injury.  Return in about 3 months (around 01/28/2022) for Diabetic Foot Care.

## 2021-12-25 ENCOUNTER — Other Ambulatory Visit: Payer: Self-pay | Admitting: Adult Health

## 2021-12-25 DIAGNOSIS — I10 Essential (primary) hypertension: Secondary | ICD-10-CM

## 2021-12-31 ENCOUNTER — Encounter: Payer: Self-pay | Admitting: Adult Health

## 2022-01-02 NOTE — Telephone Encounter (Signed)
Please advise if pt needs an appt?

## 2022-01-10 ENCOUNTER — Ambulatory Visit (INDEPENDENT_AMBULATORY_CARE_PROVIDER_SITE_OTHER): Payer: Medicare Other

## 2022-01-10 ENCOUNTER — Ambulatory Visit (INDEPENDENT_AMBULATORY_CARE_PROVIDER_SITE_OTHER): Payer: Medicare Other | Admitting: Adult Health

## 2022-01-10 ENCOUNTER — Encounter: Payer: Self-pay | Admitting: Adult Health

## 2022-01-10 VITALS — BP 130/70 | HR 91 | Temp 98.2°F | Ht 71.0 in | Wt 225.0 lb

## 2022-01-10 DIAGNOSIS — M25562 Pain in left knee: Secondary | ICD-10-CM | POA: Diagnosis not present

## 2022-01-10 DIAGNOSIS — M25561 Pain in right knee: Secondary | ICD-10-CM

## 2022-01-10 DIAGNOSIS — M545 Low back pain, unspecified: Secondary | ICD-10-CM

## 2022-01-10 DIAGNOSIS — G8929 Other chronic pain: Secondary | ICD-10-CM

## 2022-01-10 NOTE — Progress Notes (Signed)
? ?Subjective:  ? ? Patient ID: Billy Hansen, male    DOB: 09-24-1945, 76 y.o.   MRN: [id number] ? ?HPI ?76 year old male who  has a past medical history of Allergy, Arthritis, Diabetes mellitus without complication ([place]), GERD (gastroesophageal reflux disease), Hyperlipidemia, and Hypertension. ? ?He has history of chronic arthritic pain in bilateral knees and low back.  Feels as though over the last few months his pain has been getting worse.  Feels more stiff in the morning, feels as though his gait is unsteady and he is unable to walk long distances.  He has been using muscle rubs which seemed to work the best for him.  He is wondering if he can get a handicap placard and would also like to look at doing some x-rays to see how bad the arthritis is so that hopefully something can be done to give him some pain relief. ? ? ?Review of Systems ?See HPI  ? ?Past Medical History:  ?Diagnosis Date  ? Allergy   ? Arthritis   ? Diabetes mellitus without complication ([place])   ? GERD (gastroesophageal reflux disease)   ? Hyperlipidemia   ? Hypertension   ? ? ?Social History  ? ?Socioeconomic History  ? Marital status: Married  ?  Spouse name: Not on file  ? Number of children: Not on file  ? Years of education: Not on file  ? Highest education level: Not on file  ?Occupational History  ? Not on file  ?Tobacco Use  ? Smoking status: Never  ? Smokeless tobacco: Never  ?Vaping Use  ? Vaping Use: Never used  ?Substance and Sexual Activity  ? Alcohol use: No  ? Drug use: No  ? Sexual activity: Yes  ?  Partners: Female  ?Other Topics Concern  ? Not on file  ?Social History Narrative  ? Regular exercise: walk  ? Caffeine use: 1 cup of coffee  ?   ?   ? ?Social Determinants of Health  ? ?Financial Resource Strain: Low Risk   ? Difficulty of Paying Living Expenses: Not hard at all  ?Food Insecurity: No Food Insecurity  ? Worried About Charity fundraiser in the Last Year: Never true  ? Ran Out of Food in the Last Year: Never true   ?Transportation Needs: No Transportation Needs  ? Lack of Transportation (Medical): No  ? Lack of Transportation (Non-Medical): No  ?Physical Activity: Sufficiently Active  ? Days of Exercise per Week: 3 days  ? Minutes of Exercise per Session: 60 min  ?Stress: No Stress Concern Present  ? Feeling of Stress : Not at all  ?Social Connections: Moderately Integrated  ? Frequency of Communication with Friends and Family: Three times a week  ? Frequency of Social Gatherings with Friends and Family: Three times a week  ? Attends Religious Services: More than 4 times per year  ? Active Member of Clubs or Organizations: No  ? Attends Archivist Meetings: Never  ? Marital Status: Married  ?Intimate Partner Violence: Not At Risk  ? Fear of Current or Ex-Partner: No  ? Emotionally Abused: No  ? Physically Abused: No  ? Sexually Abused: No  ? ? ?Past Surgical History:  ?Procedure Laterality Date  ? COLON SURGERY  2005  ? due to injury at work per pt.   ? COLON SURGERY  2006  ? revision of ostomy bag  ? COLONOSCOPY  2006, 2017  ? KNEE SURGERY    ? right  knee  ? ? ?Family History  ?Problem Relation Age of Onset  ? Cancer Mother   ? Diabetes Brother   ? Colon cancer Neg Hx   ? ? ?Allergies  ?Allergen Reactions  ? Januvia [Sitagliptin] Other (See Comments)  ?  HA, fatigue  ? Naproxen Sodium Itching  ? Other   ?  Some type of anesthetic following colonoscopy/ notes states Propofol  ? ? ?Current Outpatient Medications on File Prior to Visit  ?Medication Sig Dispense Refill  ? lisinopril (ZESTRIL) 20 MG tablet Take 1 tablet (20 mg total) by mouth daily. 200 tablet 0  ? ACCU-CHEK GUIDE test strip TEST ONCE DAILY 100 strip 3  ? Accu-Chek Softclix Lancets lancets 1 each by Other route 3 (three) times daily. E11.9 100 each 2  ? aspirin 81 MG tablet Take 81 mg by mouth daily.    ? Blood Glucose Monitoring Suppl (ACCU-CHEK GUIDE) w/Device KIT 1 each by Does not apply route 3 (three) times daily. E11.9 1 kit 0  ? FARXIGA 10 MG  TABS tablet TAKE 1 TABLET BY MOUTH  DAILY BEFORE BREAKFAST 90 tablet 3  ? metFORMIN (GLUCOPHAGE) 1000 MG tablet TAKE 1 TABLET BY MOUTH  TWICE DAILY WITH MEALS 180 tablet 0  ? RYBELSUS 7 MG TABS TAKE 1 TABLET BY MOUTH  DAILY 90 tablet 3  ? sildenafil (REVATIO) 20 MG tablet TAKE ONE TO TWO TABLETS BY MOUTH TWO HOURS PRIOR TO SEX 30 tablet 9  ? simvastatin (ZOCOR) 40 MG tablet TAKE 1 TABLET BY MOUTH AT  BEDTIME 90 tablet 3  ? ?No current facility-administered medications on file prior to visit.  ? ? ?BP 130/70   Pulse 91   Temp 98.2 ?F (36.8 ?C) (Oral)   Ht 5' 11" (1.803 m)   Wt 225 lb (102.1 kg)   SpO2 98%   BMI 31.38 kg/m?  ? ? ?   ?Objective:  ? Physical Exam ?Vitals and nursing note reviewed.  ?Constitutional:   ?   Appearance: Normal appearance.  ?Musculoskeletal:  ?   Lumbar back: No swelling, deformity or tenderness. Decreased range of motion.  ?   Right knee: Crepitus present. Decreased range of motion.  ?   Left knee: Crepitus present. Decreased range of motion.  ?Skin: ?   General: Skin is warm and dry.  ?   Capillary Refill: Capillary refill takes less than 2 seconds.  ?Neurological:  ?   General: No focal deficit present.  ?   Mental Status: He is alert and oriented to person, place, and time.  ?Psychiatric:     ?   Mood and Affect: Mood normal.     ?   Behavior: Behavior normal.     ?   Thought Content: Thought content normal.     ?   Judgment: Judgment normal.  ? ? ?   ?Assessment & Plan:  ?1. Chronic pain of right knee ? ?- DG Knee 1-2 Views Right; Future ? ?2. Chronic pain of left knee ? ?- DG Knee 1-2 Views Left; Future ? ?3. Chronic bilateral low back pain without sciatica ? ?- DG Lumbar Spine Complete; Future ? ?Hanicap form filled out ? ?We can consider steroid injections for knee pain or referral to orthopedics ? ? ?Dorothyann Peng, NP ? ?

## 2022-01-11 ENCOUNTER — Other Ambulatory Visit: Payer: Self-pay | Admitting: Internal Medicine

## 2022-01-11 DIAGNOSIS — E1121 Type 2 diabetes mellitus with diabetic nephropathy: Secondary | ICD-10-CM

## 2022-01-12 ENCOUNTER — Ambulatory Visit (INDEPENDENT_AMBULATORY_CARE_PROVIDER_SITE_OTHER): Payer: Medicare Other | Admitting: Adult Health

## 2022-01-12 ENCOUNTER — Encounter: Payer: Self-pay | Admitting: Adult Health

## 2022-01-12 ENCOUNTER — Ambulatory Visit: Payer: Medicare Other | Admitting: Adult Health

## 2022-01-12 VITALS — BP 120/80 | HR 86 | Temp 98.2°F | Ht 71.0 in | Wt 227.0 lb

## 2022-01-12 DIAGNOSIS — M25561 Pain in right knee: Secondary | ICD-10-CM

## 2022-01-12 DIAGNOSIS — G8929 Other chronic pain: Secondary | ICD-10-CM | POA: Diagnosis not present

## 2022-01-12 MED ORDER — METHYLPREDNISOLONE ACETATE 80 MG/ML IJ SUSP
80.0000 mg | Freq: Once | INTRAMUSCULAR | Status: AC
  Administered 2022-01-12: 80 mg via INTRA_ARTICULAR

## 2022-01-12 NOTE — Progress Notes (Signed)
? ?Subjective:  ? ? Patient ID: Billy Hansen, male    DOB: 1946-07-27, 76 y.o.   MRN: [id number] ? ?HPI ?76 year old male who  has a past medical history of Allergy, Arthritis, Diabetes mellitus without complication ([place]), GERD (gastroesophageal reflux disease), Hyperlipidemia, and Hypertension. ? ?He presents to the office today for therapeutic steroid injection into his right knee. He has been having more pain in his right knee. We did an xray yesterday that showed ? 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 ?2. Mild chronic spurring at the patellar tendon insertion on the ?tibial tubercle. ? ?He has responded well to steroid injections in the past.  ? ? ?Review of Systems ?See HPI  ? ?Past Medical History:  ?Diagnosis Date  ? Allergy   ? Arthritis   ? Diabetes mellitus without complication ([place])   ? GERD (gastroesophageal reflux disease)   ? Hyperlipidemia   ? Hypertension   ? ? ?Social History  ? ?Socioeconomic History  ? Marital status: Married  ?  Spouse name: Not on file  ? Number of children: Not on file  ? Years of education: Not on file  ? Highest education level: Not on file  ?Occupational History  ? Not on file  ?Tobacco Use  ? Smoking status: Never  ? Smokeless tobacco: Never  ?Vaping Use  ? Vaping Use: Never used  ?Substance and Sexual Activity  ? Alcohol use: No  ? Drug use: No  ? Sexual activity: Yes  ?  Partners: Female  ?Other Topics Concern  ? Not on file  ?Social History Narrative  ? Regular exercise: walk  ? Caffeine use: 1 cup of coffee  ?   ?   ? ?Social Determinants of Health  ? ?Financial Resource Strain: Low Risk   ? Difficulty of Paying Living Expenses: Not hard at all  ?Food Insecurity: No Food Insecurity  ? Worried About Charity fundraiser in the Last Year: Never true  ? Ran Out of Food in the Last Year: Never true  ?Transportation Needs: No Transportation Needs  ? Lack of Transportation (Medical): No  ? Lack of Transportation (Non-Medical): No   ?Physical Activity: Sufficiently Active  ? Days of Exercise per Week: 3 days  ? Minutes of Exercise per Session: 60 min  ?Stress: No Stress Concern Present  ? Feeling of Stress : Not at all  ?Social Connections: Moderately Integrated  ? Frequency of Communication with Friends and Family: Three times a week  ? Frequency of Social Gatherings with Friends and Family: Three times a week  ? Attends Religious Services: More than 4 times per year  ? Active Member of Clubs or Organizations: No  ? Attends Archivist Meetings: Never  ? Marital Status: Married  ?Intimate Partner Violence: Not At Risk  ? Fear of Current or Ex-Partner: No  ? Emotionally Abused: No  ? Physically Abused: No  ? Sexually Abused: No  ? ? ?Past Surgical History:  ?Procedure Laterality Date  ? COLON SURGERY  2005  ? due to injury at work per pt.   ? COLON SURGERY  2006  ? revision of ostomy bag  ? COLONOSCOPY  2006, 2017  ? KNEE SURGERY    ? right knee  ? ? ?Family History  ?Problem Relation Age of Onset  ? Cancer Mother   ? Diabetes Brother   ? Colon cancer Neg Hx   ? ? ?Allergies  ?Allergen Reactions  ? Januvia [Sitagliptin] Other (See Comments)  ?  HA, fatigue  ? Naproxen Sodium Itching  ? Other   ?  Some type of anesthetic following colonoscopy/ notes states Propofol  ? ? ?Current Outpatient Medications on File Prior to Visit  ?Medication Sig Dispense Refill  ? ACCU-CHEK GUIDE test strip TEST ONCE DAILY 100 strip 3  ? Accu-Chek Softclix Lancets lancets 1 each by Other route 3 (three) times daily. E11.9 100 each 2  ? aspirin 81 MG tablet Take 81 mg by mouth daily.    ? Blood Glucose Monitoring Suppl (ACCU-CHEK GUIDE) w/Device KIT 1 each by Does not apply route 3 (three) times daily. E11.9 1 kit 0  ? FARXIGA 10 MG TABS tablet TAKE 1 TABLET BY MOUTH  DAILY BEFORE BREAKFAST 90 tablet 3  ? lisinopril (ZESTRIL) 20 MG tablet Take 1 tablet (20 mg total) by mouth daily. 200 tablet 0  ? metFORMIN (GLUCOPHAGE) 1000 MG tablet TAKE 1 TABLET BY MOUTH   TWICE DAILY WITH MEALS 180 tablet 0  ? RYBELSUS 7 MG TABS TAKE 1 TABLET BY MOUTH  DAILY 90 tablet 3  ? sildenafil (REVATIO) 20 MG tablet TAKE ONE TO TWO TABLETS BY MOUTH TWO HOURS PRIOR TO SEX 30 tablet 9  ? simvastatin (ZOCOR) 40 MG tablet TAKE 1 TABLET BY MOUTH AT  BEDTIME 90 tablet 3  ? ?No current facility-administered medications on file prior to visit.  ? ? ?BP 120/80   Pulse 86   Temp 98.2 ?F (36.8 ?C) (Oral)   Ht _0  (1.803 m)   Wt 227 lb (103 kg)   SpO2 99%   BMI 31.66 kg/m?  ? ? ?   ?Objective:  ? Physical Exam ?Vitals and nursing note reviewed.  ?Constitutional:   ?   Appearance: Normal appearance.  ?Musculoskeletal:     ?   General: No swelling or tenderness. Normal range of motion.  ?Skin: ?   General: Skin is warm and dry.  ?   Capillary Refill: Capillary refill takes less than 2 seconds.  ?Neurological:  ?   General: No focal deficit present.  ?   Mental Status: He is alert and oriented to person, place, and time.  ?Psychiatric:     ?   Mood and Affect: Mood normal.     ?   Behavior: Behavior normal.     ?   Thought Content: Thought content normal.     ?   Judgment: Judgment normal.  ? ?   ?Assessment & Plan:  ?1. Chronic pain of right knee ?Discussed risks and benefits of corticosteroid injection and patient consented.  After prepping skin with betadine, injected 80 mg depomedrol and 2 cc of plain xylocaine with 22 gauge one and one half inch needle using anterolateral approach and pt tolerated well. ? ?- methylPREDNISolone acetate (DEPO-MEDROL) injection 80 mg ? ?Dorothyann Peng, NP ? ?

## 2022-01-17 ENCOUNTER — Other Ambulatory Visit: Payer: Self-pay | Admitting: Internal Medicine

## 2022-01-17 DIAGNOSIS — E1121 Type 2 diabetes mellitus with diabetic nephropathy: Secondary | ICD-10-CM

## 2022-01-19 ENCOUNTER — Ambulatory Visit (INDEPENDENT_AMBULATORY_CARE_PROVIDER_SITE_OTHER): Payer: Medicare Other | Admitting: Adult Health

## 2022-01-19 ENCOUNTER — Encounter: Payer: Self-pay | Admitting: Adult Health

## 2022-01-19 VITALS — BP 122/70 | HR 85 | Temp 98.7°F | Ht 71.0 in | Wt 227.0 lb

## 2022-01-19 DIAGNOSIS — G8929 Other chronic pain: Secondary | ICD-10-CM | POA: Diagnosis not present

## 2022-01-19 DIAGNOSIS — M25562 Pain in left knee: Secondary | ICD-10-CM | POA: Diagnosis not present

## 2022-01-19 MED ORDER — METHYLPREDNISOLONE ACETATE 80 MG/ML IJ SUSP
80.0000 mg | Freq: Once | INTRAMUSCULAR | Status: AC
  Administered 2022-01-19: 80 mg via INTRA_ARTICULAR

## 2022-01-19 MED ORDER — METHYLPREDNISOLONE ACETATE 80 MG/ML IJ SUSP: 80.0000 mg | Freq: Once | INTRAMUSCULAR | Status: DC

## 2022-01-19 NOTE — Progress Notes (Signed)
? ?Subjective:  ? ? Patient ID: Billy Hansen, male    DOB: [birth year], 76 y.o.   MRN: [id number] ? ?HPI ?76 year old male who  has a past medical history of Allergy, Arthritis, Diabetes mellitus without complication ([place]), GERD (gastroesophageal reflux disease), Hyperlipidemia, and Hypertension. ? ?He presents to the office today for therapeutic steroid injection into his left knee. He has mild medial and patellofemoral compartment osteoarthritis of left knee.  ? ?We injected his right knee last week and he reports he has been pain free. His blood sugars have been below 120  ? ? ? ? ?Review of Systems ?See HPI  ? ?Past Medical History:  ?Diagnosis Date  ? Allergy   ? Arthritis   ? Diabetes mellitus without complication ([place])   ? GERD (gastroesophageal reflux disease)   ? Hyperlipidemia   ? Hypertension   ? ? ?Social History  ? ?Socioeconomic History  ? Marital status: Married  ?  Spouse name: Not on file  ? Number of children: Not on file  ? Years of education: Not on file  ? Highest education level: Not on file  ?Occupational History  ? Not on file  ?Tobacco Use  ? Smoking status: Never  ? Smokeless tobacco: Never  ?Vaping Use  ? Vaping Use: Never used  ?Substance and Sexual Activity  ? Alcohol use: No  ? Drug use: No  ? Sexual activity: Yes  ?  Partners: Female  ?Other Topics Concern  ? Not on file  ?Social History Narrative  ? Regular exercise: walk  ? Caffeine use: 1 cup of coffee  ?   ?   ? ?Social Determinants of Health  ? ?Financial Resource Strain: Unknown  ? Difficulty of Paying Living Expenses: Patient refused  ?Food Insecurity: Unknown  ? Worried About Charity fundraiser in the Last Year: Patient refused  ? Ran Out of Food in the Last Year: Patient refused  ?Transportation Needs: Unknown  ? Lack of Transportation (Medical): Patient refused  ? Lack of Transportation (Non-Medical): Patient refused  ?Physical Activity: Unknown  ? Days of Exercise per Week: Patient refused  ? Minutes of Exercise per  Session: 60 min  ?Stress: No Stress Concern Present  ? Feeling of Stress : Not at all  ?Social Connections: Unknown  ? Frequency of Communication with Friends and Family: Patient refused  ? Frequency of Social Gatherings with Friends and Family: Patient refused  ? Attends Religious Services: Patient refused  ? Active Member of Clubs or Organizations: Patient refused  ? Attends Archivist Meetings: Never  ? Marital Status: Patient refused  ?Intimate Partner Violence: Not At Risk  ? Fear of Current or Ex-Partner: No  ? Emotionally Abused: No  ? Physically Abused: No  ? Sexually Abused: No  ? ? ?Past Surgical History:  ?Procedure Laterality Date  ? COLON SURGERY  2005  ? due to injury at work per pt.   ? COLON SURGERY  2006  ? revision of ostomy bag  ? COLONOSCOPY  2006, 2017  ? KNEE SURGERY    ? right knee  ? ? ?Family History  ?Problem Relation Age of Onset  ? Cancer Mother   ? Diabetes Brother   ? Colon cancer Neg Hx   ? ? ?Allergies  ?Allergen Reactions  ? Januvia [Sitagliptin] Other (See Comments)  ?  HA, fatigue  ? Naproxen Sodium Itching  ? Other   ?  Some type of anesthetic following colonoscopy/ notes states  Propofol  ? ? ?Current Outpatient Medications on File Prior to Visit  ?Medication Sig Dispense Refill  ? ACCU-CHEK GUIDE test strip TEST ONCE DAILY 100 strip 3  ? Accu-Chek Softclix Lancets lancets 1 each by Other route 3 (three) times daily. E11.9 100 each 2  ? aspirin 81 MG tablet Take 81 mg by mouth daily.    ? Blood Glucose Monitoring Suppl (ACCU-CHEK GUIDE) w/Device KIT 1 each by Does not apply route 3 (three) times daily. E11.9 1 kit 0  ? FARXIGA 10 MG TABS tablet TAKE 1 TABLET BY MOUTH  DAILY BEFORE BREAKFAST 90 tablet 3  ? lisinopril (ZESTRIL) 20 MG tablet Take 1 tablet (20 mg total) by mouth daily. 200 tablet 0  ? metFORMIN (GLUCOPHAGE) 1000 MG tablet TAKE 1 TABLET BY MOUTH TWICE  DAILY WITH MEALS 180 tablet 1  ? RYBELSUS 7 MG TABS TAKE 1 TABLET BY MOUTH  DAILY 90 tablet 3  ? sildenafil  (REVATIO) 20 MG tablet TAKE ONE TO TWO TABLETS BY MOUTH TWO HOURS PRIOR TO SEX 30 tablet 9  ? simvastatin (ZOCOR) 40 MG tablet TAKE 1 TABLET BY MOUTH AT  BEDTIME 90 tablet 3  ? ?No current facility-administered medications on file prior to visit.  ? ? ?BP 122/70   Pulse 85   Temp 98.7 ?F (37.1 ?C) (Oral)   Ht _0  (1.803 m)   Wt 227 lb (103 kg)   SpO2 99%   BMI 31.66 kg/m?  ? ? ?   ?Objective:  ? Physical Exam ?Vitals and nursing note reviewed.  ?Constitutional:   ?   Appearance: Normal appearance.  ?Musculoskeletal:     ?   General: No swelling, tenderness or deformity. Normal range of motion.  ?Skin: ?   General: Skin is warm and dry.  ?Neurological:  ?   General: No focal deficit present.  ?   Mental Status: He is alert and oriented to person, place, and time.  ?Psychiatric:     ?   Mood and Affect: Mood normal.     ?   Behavior: Behavior normal.     ?   Thought Content: Thought content normal.     ?   Judgment: Judgment normal.  ? ?   ?Assessment & Plan:  ?1. Chronic pain of left knee ?Discussed risks and benefits of corticosteroid injection and patient consented.  After prepping skin with betadine, injected 80 mg depomedrol and 2 cc of plain xylocaine with 22 gauge one and one half inch needle using anterolateral approach and pt tolerated well. ? ?- methylPREDNISolone acetate (DEPO-MEDROL) injection 80 mg ? ?Dorothyann Peng, NP ? ? ? ?

## 2022-01-29 ENCOUNTER — Ambulatory Visit (INDEPENDENT_AMBULATORY_CARE_PROVIDER_SITE_OTHER): Payer: Medicare Other | Admitting: Podiatry

## 2022-01-29 ENCOUNTER — Encounter: Payer: Self-pay | Admitting: Podiatry

## 2022-01-29 DIAGNOSIS — E1151 Type 2 diabetes mellitus with diabetic peripheral angiopathy without gangrene: Secondary | ICD-10-CM | POA: Diagnosis not present

## 2022-01-29 DIAGNOSIS — B353 Tinea pedis: Secondary | ICD-10-CM

## 2022-01-29 DIAGNOSIS — E1169 Type 2 diabetes mellitus with other specified complication: Secondary | ICD-10-CM

## 2022-01-29 DIAGNOSIS — E1121 Type 2 diabetes mellitus with diabetic nephropathy: Secondary | ICD-10-CM

## 2022-01-29 DIAGNOSIS — B351 Tinea unguium: Secondary | ICD-10-CM | POA: Diagnosis not present

## 2022-01-29 DIAGNOSIS — L600 Ingrowing nail: Secondary | ICD-10-CM | POA: Diagnosis not present

## 2022-01-29 MED ORDER — CLOTRIMAZOLE-BETAMETHASONE 1-0.05 % EX CREA: TOPICAL_CREAM | CUTANEOUS | 0 refills | Status: DC

## 2022-01-29 NOTE — Patient Instructions (Addendum)
EPSOM SALT FOOT SOAK INSTRUCTIONS ? ?*IF YOU HAVE BEEN PRESCRIBED ANTIBIOTICS, TAKE AS INSTRUCTED UNTIL ALL ARE GONE* ? ?Shopping List:  ?A. Plain epsom salt (not scented) ?B. Neosporin Cream/Ointment or Bacitracin Cream/Ointment (or prescribed antiobiotic drops/cream/ointment) ?C. 1-inch fabric band-aids ? ? Place 1/4 cup of epsom salts in 2 quarts of warm tap water. IF YOU ARE DIABETIC, OR HAVE NEUROPATHY, CHECK THE TEMPERATURE OF THE WATER WITH YOUR ELBOW. ? ? Submerge your foot/feet in the solution and soak for 10-15 minutes.     ? ?3.  Next, remove your foot/feet from solution, blot dry the affected area.   ? ?4.  Apply light amount of antibiotic cream/ointment and cover with fabric band-aid . ? ?5.  This soak should be done once a day for 7 days.  ? ?6.  Monitor for any signs/symptoms of infection such as redness, swelling, odor, drainage, increased pain, or non-healing of digit.  ? ?7.  Please do not hesitate to call the office and speak to a Nurse or Doctor if you have questions.  ? ?8.  If you experience fever, chills, nightsweats, nausea or vomiting with worsening of digit/foot, please go to the emergency room.   ? ?Athlete's Foot ?Athlete's foot (tinea pedis) is a fungal infection of the skin on your feet. It often occurs on the skin that is between or underneath the toes. It can also occur on the soles of your feet. The infection can spread from person to person (is contagious). It can also spread when a person's bare feet come in contact with the fungus on shower floors or on items such as shoes. ?What are the causes? ?This condition is caused by a fungus that grows in warm, moist places. You can get athlete's foot by sharing shoes, shower stalls, towels, and wet floors with someone who is infected. Not washing your feet or changing your socks often enough can also lead to athlete's foot. ?What increases the risk? ?This condition is more likely to develop in: ?Men. ?People who have a weak body defense  system (immune system). ?People who have diabetes. ?People who use public showers, such as at a gym. ?People who wear heavy-duty shoes, such as Youth worker. ?Seasons with warm, humid weather. ?What are the signs or symptoms? ?Symptoms of this condition include: ?Itchy areas between your toes or on the soles of your feet. ?White, flaky, or scaly areas between your toes or on the soles of your feet. ?Very itchy small blisters between your toes or on the soles of your feet. ?Small cuts in your skin. These cuts can become infected. ?Thick or discolored toenails. ?How is this diagnosed? ?This condition may be diagnosed with a physical exam and a review of your medical history. Your health care provider may also take a skin or toenail sample to examine under a microscope. ?How is this treated? ?This condition is treated with antifungal medicines. These may be applied as powders, ointments, or creams. In severe cases, an oral antifungal medicine may be given. ?Follow these instructions at home: ?Medicines ?Apply or take over-the-counter and prescription medicines only as told by your health care provider. ?Apply your antifungal medicine as told by your health care provider. Do not stop using the antifungal even if your condition improves. ?Foot care ?Do not scratch your feet. ?Keep your feet dry: ?Wear cotton or wool socks. Change your socks every day or if they become wet. ?Wear shoes that allow air to flow, such as sandals or canvas  tennis shoes. ?Wash and dry your feet, including the area between your toes. Also, wash and dry your feet: ?Every day or as told by your health care provider. ?After exercising. ?General instructions ?Do not let others use towels, shoes, nail clippers, or other personal items that touch your feet. ?Protect your feet by wearing sandals in wet areas, such as locker rooms and shared showers. ?Keep all follow-up visits. This is important. ?If you have diabetes, keep your blood  sugar under control. ?Contact a health care provider if: ?You have a fever. ?You have swelling, soreness, warmth, or redness in your foot. ?Your feet are not getting better with treatment. ?Your symptoms get worse. ?You have new symptoms. ?You have severe pain. ?Summary ?Athlete's foot (tinea pedis) is a fungal infection of the skin on your feet. It often occurs on skin that is between or underneath the toes. ?This condition is caused by a fungus that grows in warm, moist places. ?Symptoms include white, flaky, or scaly areas between your toes or on the soles of your feet. ?This condition is treated with antifungal medicines. ?Keep your feet clean. Always dry them thoroughly. ?This information is not intended to replace advice given to you by your health care provider. Make sure you discuss any questions you have with your health care provider. ?Document Revised: 12/25/2020 Document Reviewed: 12/25/2020 ?Elsevier Patient Education ? 2023 Elsevier Inc. ? ?

## 2022-02-04 NOTE — Progress Notes (Signed)
  Subjective:  Patient ID: Billy Hansen, male    DOB: [birth year],  MRN: [id number]  Billy Hansen presents to clinic today for thick, elongated toenails right lower extremity which are tender when wearing enclosed shoe gear.  Last known HgA1c was 6.7%. Patient did not check blood glucose today.   PCP is [name], NP , and last visit was Jan 19, 2022.  Allergies  Allergen Reactions   Januvia [Sitagliptin] Other (See Comments)    HA, fatigue   Naproxen Sodium Itching   Other     Some type of anesthetic following colonoscopy/ notes states Propofol    Review of Systems: Negative except as noted in the HPI.  Objective:  Objective:   Vascular Examination: CFT <3 seconds b/l LE. Palpable DP pulse(s) b/l LE. Diminished PT pulse(s) b/l LE. Pedal hair sparse. No pain with calf compression b/l. Lower extremity skin temperature gradient within normal limits. No ischemia or gangrene noted b/l LE. No cyanosis or clubbing noted b/l LE.  Neurological Examination: Protective sensation intact 5/5 intact bilaterally with 10g monofilament b/l. Vibratory sensation intact b/l.  Dermatological Examination: Pedal integument with normal turgor, texture and tone BLE. No open wounds b/l LE. No interdigital macerations noted b/l LE.  Mycotic incurvated nailplate medial border R hallux and R 2nd toe.  Nail border hypertrophy minimal. There is tenderness to palpation. Sign(s) of infection: no clinical signs of infection noted on examination today. Diffuse scaling noted peripherally and plantarly b/l feet.  No interdigital macerations.  No blisters, no weeping. No signs of secondary bacterial infection noted.  Musculoskeletal Examination: Normal muscle strength 5/5 to all lower extremity muscle groups bilaterally. No pain, crepitus or joint limitation noted with ROM b/l LE. No gross bony pedal deformities b/l. Patient ambulates independently without assistive aids.  Radiographs: None  Last A1c:       Latest Ref Rng & Units 04/04/2021    9:48 AM  Hemoglobin A1C  Hemoglobin-A1c 4.6 - 6.5 % 7.6      Assessment/Plan: 1. Onychomycosis of multiple toenails with type 2 diabetes mellitus and peripheral angiopathy (HCC)   2. Ingrown toenail without infection   3. Tinea pedis of both feet   4. Type 2 diabetes mellitus with diabetic nephropathy, without long-term current use of insulin (HCC)      -Patient was evaluated and treated. All patient's and/or POA's questions/concerns answered on today's visit. -Offending nail border debrided and curretaged R hallux and R 2nd toe utilizing sterile nail nipper and currette. Border(s) cleansed with alcohol and TAO applied. Dispensed written instructions for once daily epsom salt soaks for 7 days. Call office if there are any concerns. -For tinea pedis, prescription written for Lotrisone Cream 1%/0.05% to be applied to affected foot/feet twice daily for 6 weeks. -Patient/POA to call should there be question/concern in the interim.   Return in about 3 months (around 05/01/2022).  Freddie Breech, DPM

## 2022-02-08 ENCOUNTER — Encounter: Payer: Self-pay | Admitting: Internal Medicine

## 2022-02-08 ENCOUNTER — Other Ambulatory Visit: Payer: Self-pay | Admitting: Internal Medicine

## 2022-02-08 DIAGNOSIS — E1121 Type 2 diabetes mellitus with diabetic nephropathy: Secondary | ICD-10-CM

## 2022-02-15 ENCOUNTER — Ambulatory Visit (INDEPENDENT_AMBULATORY_CARE_PROVIDER_SITE_OTHER): Payer: Medicare Other | Admitting: Internal Medicine

## 2022-02-15 ENCOUNTER — Encounter: Payer: Self-pay | Admitting: Internal Medicine

## 2022-02-15 VITALS — BP 130/78 | HR 79 | Ht 71.0 in | Wt 228.4 lb

## 2022-02-15 DIAGNOSIS — E66811 Obesity, class 1: Secondary | ICD-10-CM

## 2022-02-15 DIAGNOSIS — E669 Obesity, unspecified: Secondary | ICD-10-CM

## 2022-02-15 DIAGNOSIS — E785 Hyperlipidemia, unspecified: Secondary | ICD-10-CM | POA: Diagnosis not present

## 2022-02-15 DIAGNOSIS — E1121 Type 2 diabetes mellitus with diabetic nephropathy: Secondary | ICD-10-CM | POA: Diagnosis not present

## 2022-02-15 LAB — POCT GLYCOSYLATED HEMOGLOBIN (HGB A1C): Hemoglobin A1C: 7.5 % — AB (ref 4.0–5.6)
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
## 2022-02-15 NOTE — Patient Instructions (Addendum)
Please continue: - Metformin 1000 mg 2x a day with meals - Farxiga 10 mg before b'fast - Rybelsus 7 mg before b'fast  Please return in 4 months with your sugar log.

## 2022-02-17 ENCOUNTER — Other Ambulatory Visit: Payer: Self-pay | Admitting: Podiatry

## 2022-02-17 DIAGNOSIS — B353 Tinea pedis: Secondary | ICD-10-CM

## 2022-02-18 MED ORDER — CLOTRIMAZOLE-BETAMETHASONE 1-0.05 % EX CREA: TOPICAL_CREAM | CUTANEOUS | 0 refills | Status: AC

## 2022-02-18 NOTE — Telephone Encounter (Signed)
Rx refill approved for Lotrisone Cream.

## 2022-02-26 ENCOUNTER — Ambulatory Visit (INDEPENDENT_AMBULATORY_CARE_PROVIDER_SITE_OTHER): Payer: Medicare Other | Admitting: Podiatry

## 2022-02-26 ENCOUNTER — Encounter: Payer: Self-pay | Admitting: Podiatry

## 2022-02-26 DIAGNOSIS — S99921A Unspecified injury of right foot, initial encounter: Secondary | ICD-10-CM

## 2022-02-26 NOTE — Progress Notes (Signed)
Subjective:   Patient ID: Billy Hansen, male   DOB: 76 y.o.   MRN: [id number]   HPI Patient states he traumatized his right big toe getting ready to go on vacation is concerned about his diabetes and whether there could be an issue   ROS      Objective:  Physical Exam  Neurovascular status unchanged with slight abrasion right plantar hallux that is localized no proximal edema erythema drainage noted     Assessment:  Trauma to the right hallux but appears to be strictly soft tissue     Plan:  Reviewed that it looks normal at this time I do not think this will get infected but if any redness or swelling or drainage were to occur he is to contact me immediately.  Should be self-limiting

## 2022-03-18 ENCOUNTER — Other Ambulatory Visit: Payer: Self-pay | Admitting: Internal Medicine

## 2022-03-18 DIAGNOSIS — E1121 Type 2 diabetes mellitus with diabetic nephropathy: Secondary | ICD-10-CM

## 2022-03-31 ENCOUNTER — Other Ambulatory Visit: Payer: Self-pay | Admitting: Adult Health

## 2022-03-31 DIAGNOSIS — E785 Hyperlipidemia, unspecified: Secondary | ICD-10-CM

## 2022-04-05 ENCOUNTER — Encounter: Payer: Self-pay | Admitting: Adult Health

## 2022-04-05 ENCOUNTER — Ambulatory Visit (INDEPENDENT_AMBULATORY_CARE_PROVIDER_SITE_OTHER): Payer: Medicare Other | Admitting: Adult Health

## 2022-04-05 VITALS — BP 118/82 | HR 81 | Temp 98.4°F | Ht 70.0 in | Wt 228.0 lb

## 2022-04-05 DIAGNOSIS — N183 Chronic kidney disease, stage 3 unspecified: Secondary | ICD-10-CM | POA: Diagnosis not present

## 2022-04-05 DIAGNOSIS — K219 Gastro-esophageal reflux disease without esophagitis: Secondary | ICD-10-CM | POA: Diagnosis not present

## 2022-04-05 DIAGNOSIS — Z125 Encounter for screening for malignant neoplasm of prostate: Secondary | ICD-10-CM

## 2022-04-05 DIAGNOSIS — I10 Essential (primary) hypertension: Secondary | ICD-10-CM | POA: Diagnosis not present

## 2022-04-05 DIAGNOSIS — E785 Hyperlipidemia, unspecified: Secondary | ICD-10-CM | POA: Diagnosis not present

## 2022-04-05 DIAGNOSIS — E1122 Type 2 diabetes mellitus with diabetic chronic kidney disease: Secondary | ICD-10-CM | POA: Diagnosis not present

## 2022-04-05 DIAGNOSIS — E669 Obesity, unspecified: Secondary | ICD-10-CM

## 2022-04-05 DIAGNOSIS — Z0000 Encounter for general adult medical examination without abnormal findings: Secondary | ICD-10-CM

## 2022-04-05 LAB — COMPREHENSIVE METABOLIC PANEL
ALT: 24 U/L (ref 0–53)
AST: 16 U/L (ref 0–37)
Albumin: 4.7 g/dL (ref 3.5–5.2)
Alkaline Phosphatase: 61 U/L (ref 39–117)
BUN: 16 mg/dL (ref 6–23)
CO2: 28 mEq/L (ref 19–32)
Calcium: 9.6 mg/dL (ref 8.4–10.5)
Chloride: 104 mEq/L (ref 96–112)
Creatinine, Ser: 1.09 mg/dL (ref 0.40–1.50)
GFR: 66.9 mL/min (ref >60.00)
Glucose, Bld: 143 mg/dL — ABNORMAL HIGH (ref 70–99)
Potassium: 4.5 mEq/L (ref 3.5–5.1)
Sodium: 142 mEq/L (ref 135–145)
Total Bilirubin: 1.1 mg/dL (ref 0.2–1.2)
Total Protein: 6.7 g/dL (ref 6.0–8.3)

## 2022-04-05 LAB — CBC WITH DIFFERENTIAL/PLATELET
Basophils Absolute: 0 10*3/uL (ref 0.0–0.1)
Basophils Relative: 0.5 % (ref 0.0–3.0)
Eosinophils Absolute: 0.1 10*3/uL (ref 0.0–0.7)
Eosinophils Relative: 1.8 % (ref 0.0–5.0)
HCT: 47.2 % (ref 39.0–52.0)
Hemoglobin: 15.6 g/dL (ref 13.0–17.0)
Lymphocytes Relative: 36.1 % (ref 12.0–46.0)
Lymphs Abs: 1.7 10*3/uL (ref 0.7–4.0)
MCHC: 33 g/dL (ref 30.0–36.0)
MCV: 88.9 fl (ref 78.0–100.0)
Monocytes Absolute: 0.3 10*3/uL (ref 0.1–1.0)
Monocytes Relative: 7.4 % (ref 3.0–12.0)
Neutro Abs: 2.6 10*3/uL (ref 1.4–7.7)
Neutrophils Relative %: 54.2 % (ref 43.0–77.0)
Platelets: 126 10*3/uL — ABNORMAL LOW (ref 150.0–400.0)
RBC: 5.31 Mil/uL (ref 4.22–5.81)
RDW: 13.8 % (ref 11.5–15.5)
WBC: 4.7 10*3/uL (ref 4.0–10.5)

## 2022-04-05 LAB — LDL CHOLESTEROL, DIRECT: Direct LDL: 71 mg/dL

## 2022-04-05 LAB — LIPID PANEL
Cholesterol: 141 mg/dL (ref 0–200)
HDL: 47.3 mg/dL (ref >39.00)
NonHDL: 93.38
Total CHOL/HDL Ratio: 3
Triglycerides: 218 mg/dL — ABNORMAL HIGH (ref 0.0–149.0)
VLDL: 43.6 mg/dL — ABNORMAL HIGH (ref 0.0–40.0)

## 2022-04-05 LAB — TSH: TSH: 0.93 u[IU]/mL (ref 0.35–5.50)

## 2022-04-05 LAB — PSA: PSA: 1.56 ng/mL (ref 0.10–4.00)

## 2022-04-05 MED ORDER — LISINOPRIL 20 MG PO TABS: 20.0000 mg | ORAL_TABLET | Freq: Two times a day (BID) | ORAL | 3 refills | Status: DC

## 2022-04-05 NOTE — Patient Instructions (Signed)
It was great seeing you today   We will follow up with you regarding your lab work   Please let me know if you need anything   

## 2022-04-05 NOTE — Progress Notes (Signed)
Subjective:    Patient ID: Billy Hansen, male    DOB: 1945-10-04, 76 y.o.   MRN: [id number]  HPI Patient presents for yearly preventative medicine examination. He is a pleasant 76 year old male who  has a past medical history of Allergy, Arthritis, Diabetes mellitus without complication ([place]), GERD (gastroesophageal reflux disease), Hyperlipidemia, and Hypertension.  DM Type 2 - managed by endocrinology.  He is currently prescribed Farxiga 10 mg daily, Rybelsus 7 mg in the a.m. and metformin 1000 mg twice daily.  He does check his blood sugars at home with readings mostly in the 140s in the morning but improves later on in the day.  His last A1c was 7.5 in June 2023  Hypertension -controlled with Lasix 20 mg daily and lisinopril 40 mg daily.  He denies chest pain, shortness of breath, headache, dizziness, blurred vision, or syncopal episodes BP Readings from Last 3 Encounters:  04/05/22 118/82  02/15/22 130/78  01/19/22 122/70   Hyperlipidemia -takes Zocor 40 mg daily and 81 mg aspirin. Lab Results  Component Value Date   CHOL 134 04/04/2021   HDL 41.90 04/04/2021   LDLCALC 69 04/04/2021   LDLDIRECT 149.4 07/12/2014   TRIG 116.0 04/04/2021   CHOLHDL 3 04/04/2021   Erectile dysfunction-uses Viagra as needed  GERD-controlled with Prilosec 20 mg daily.  All immunizations and health maintenance protocols were reviewed with the patient and needed orders were placed.  Appropriate screening laboratory values were ordered for the patient including screening of hyperlipidemia, renal function and hepatic function. If indicated by BPH, a PSA was ordered.  Medication reconciliation,  past medical history, social history, problem list and allergies were reviewed in detail with the patient  Goals were established with regard to weight loss, exercise, and  diet in compliance with medications Wt Readings from Last 3 Encounters:  04/05/22 228 lb (103.4 kg)  02/15/22 228 lb 6.4 oz (103.6  kg)  01/19/22 227 lb (103 kg)    He has no acute complaints today    Review of Systems  Constitutional: Negative.   HENT: Negative.    Eyes: Negative.   Respiratory: Negative.    Cardiovascular: Negative.   Gastrointestinal: Negative.   Endocrine: Negative.   Genitourinary: Negative.   Musculoskeletal: Negative.   Skin: Negative.   Allergic/Immunologic: Negative.   Neurological: Negative.   Hematological: Negative.   Psychiatric/Behavioral: Negative.    All other systems reviewed and are negative.  Past Medical History:  Diagnosis Date   Allergy    Arthritis    Diabetes mellitus without complication (HCC)    GERD (gastroesophageal reflux disease)    Hyperlipidemia    Hypertension     Social History   Socioeconomic History   Marital status: Married    Spouse name: Not on file   Number of children: Not on file   Years of education: Not on file   Highest education level: Not on file  Occupational History   Not on file  Tobacco Use   Smoking status: Never   Smokeless tobacco: Never  Vaping Use   Vaping Use: Never used  Substance and Sexual Activity   Alcohol use: No   Drug use: No   Sexual activity: Yes    Partners: Female  Other Topics Concern   Not on file  Social History Narrative   Regular exercise: walk   Caffeine use: 1 cup of coffee         Social Determinants of Radio broadcast assistant  Strain: Unknown (01/18/2022)   Overall Financial Resource Strain (CARDIA)    Difficulty of Paying Living Expenses: Patient refused  Food Insecurity: Unknown (01/18/2022)   Hunger Vital Sign    Worried About Running Out of Food in the Last Year: Patient refused    [place] in the Last Year: Patient refused  Transportation Needs: Unknown (01/18/2022)   PRAPARE - Hydrologist (Medical): Patient refused    Lack of Transportation (Non-Medical): Patient refused  Physical Activity: Unknown (01/18/2022)   Exercise Vital Sign    Days  of Exercise per Week: Patient refused    Minutes of Exercise per Session: 60 min  Stress: No Stress Concern Present (01/18/2022)   [employer] of Stress : Not at all  Social Connections: Unknown (01/18/2022)   Social Connection and Isolation Panel [NHANES]    Frequency of Communication with Friends and Family: Patient refused    Frequency of Social Gatherings with Friends and Family: Patient refused    Attends Religious Services: Patient refused    Active Member of Clubs or Organizations: Patient refused    Attends Archivist Meetings: Never    Marital Status: Patient refused  Intimate Partner Violence: Not At Risk (05/25/2021)   Humiliation, Afraid, Rape, and Kick questionnaire    Fear of Current or Ex-Partner: No    Emotionally Abused: No    Physically Abused: No    Sexually Abused: No    Past Surgical History:  Procedure Laterality Date   COLON SURGERY  2005   due to injury at work per pt.    COLON SURGERY  2006   revision of ostomy bag   COLONOSCOPY  2006, 2017   KNEE SURGERY     right knee    Family History  Problem Relation Age of Onset   Cancer Mother    Diabetes Brother    Colon cancer Neg Hx     Allergies  Allergen Reactions   Januvia [Sitagliptin] Other (See Comments)    HA, fatigue   Naproxen Sodium Itching   Other     Some type of anesthetic following colonoscopy/ notes states Propofol    Current Outpatient Medications on File Prior to Visit  Medication Sig Dispense Refill   ACCU-CHEK GUIDE test strip TEST ONCE DAILY 100 strip 3   Accu-Chek Softclix Lancets lancets 1 each by Other route 3 (three) times daily. E11.9 100 each 2   aspirin 81 MG tablet Take 81 mg by mouth daily.     Blood Glucose Monitoring Suppl (ACCU-CHEK GUIDE) w/Device KIT 1 each by Does not apply route 3 (three) times daily. E11.9 1 kit 0   clotrimazole-betamethasone (LOTRISONE) cream Apply to feet and  between toes bid x 6 weeks. 45 g 0   FARXIGA 10 MG TABS tablet TAKE 1 TABLET BY MOUTH  DAILY BEFORE BREAKFAST 90 tablet 3   lisinopril (ZESTRIL) 20 MG tablet Take 1 tablet (20 mg total) by mouth daily. 200 tablet 0   metFORMIN (GLUCOPHAGE) 1000 MG tablet TAKE 1 TABLET BY MOUTH TWICE  DAILY WITH MEALS 180 tablet 0   RYBELSUS 7 MG TABS TAKE 1 TABLET BY MOUTH  DAILY 90 tablet 3   sildenafil (REVATIO) 20 MG tablet TAKE ONE TO TWO TABLETS BY MOUTH TWO HOURS PRIOR TO SEX 30 tablet 9   simvastatin (ZOCOR) 40 MG tablet TAKE 1 TABLET BY MOUTH AT  BEDTIME 100  tablet 2   No current facility-administered medications on file prior to visit.    BP 118/82 (BP Location: Left Arm, Patient Position: Sitting, Cuff Size: Large)   Pulse 81   Temp 98.4 F (36.9 C) (Oral)   Ht 5' 10"  (1.778 m)   Wt 228 lb (103.4 kg)   SpO2 98%   BMI 32.71 kg/m       Objective:   Physical Exam Vitals and nursing note reviewed.  Constitutional:      General: He is not in acute distress.    Appearance: Normal appearance. He is well-developed and normal weight.  HENT:     Head: Normocephalic and atraumatic.     Right Ear: Tympanic membrane, ear canal and external ear normal. There is no impacted cerumen.     Left Ear: Tympanic membrane, ear canal and external ear normal. There is no impacted cerumen.     Nose: Nose normal. No congestion or rhinorrhea.     Mouth/Throat:     Mouth: Mucous membranes are moist.     Pharynx: Oropharynx is clear. No oropharyngeal exudate or posterior oropharyngeal erythema.  Eyes:     General:        Right eye: No discharge.        Left eye: No discharge.     Extraocular Movements: Extraocular movements intact.     Conjunctiva/sclera: Conjunctivae normal.     Pupils: Pupils are equal, round, and reactive to light.  Neck:     Vascular: No carotid bruit.     Trachea: No tracheal deviation.  Cardiovascular:     Rate and Rhythm: Normal rate and regular rhythm.     Pulses: Normal pulses.      Heart sounds: Normal heart sounds. No murmur heard.    No friction rub. No gallop.  Pulmonary:     Effort: Pulmonary effort is normal. No respiratory distress.     Breath sounds: Normal breath sounds. No stridor. No wheezing, rhonchi or rales.  Chest:     Chest wall: No tenderness.  Abdominal:     General: Bowel sounds are normal. There is no distension.     Palpations: Abdomen is soft. There is no mass.     Tenderness: There is no abdominal tenderness. There is no right CVA tenderness, left CVA tenderness, guarding or rebound.     Hernia: No hernia is present.  Musculoskeletal:        General: No swelling, tenderness, deformity or signs of injury. Normal range of motion.     Right lower leg: No edema.     Left lower leg: No edema.  Lymphadenopathy:     Cervical: No cervical adenopathy.  Skin:    General: Skin is warm and dry.     Capillary Refill: Capillary refill takes less than 2 seconds.     Coloration: Skin is not jaundiced or pale.     Findings: No bruising, erythema, lesion or rash.  Neurological:     General: No focal deficit present.     Mental Status: He is alert and oriented to person, place, and time.     Cranial Nerves: No cranial nerve deficit.     Sensory: No sensory deficit.     Motor: No weakness.     Coordination: Coordination normal.     Gait: Gait normal.     Deep Tendon Reflexes: Reflexes normal.  Psychiatric:        Mood and Affect: Mood normal.  Behavior: Behavior normal.        Thought Content: Thought content normal.        Judgment: Judgment normal.       Assessment & Plan:   1. Routine general medical examination at a health care facility - Continue to work on weight loss through diet and exercise - Follow up in one year or sooner if needed - CBC with Differential/Platelet; Future - Comprehensive metabolic panel; Future - Lipid panel; Future - TSH; Future  2. Essential hypertension - Controlled. No change in medication - CBC  with Differential/Platelet; Future - Comprehensive metabolic panel; Future - Lipid panel; Future - TSH; Future - lisinopril (ZESTRIL) 20 MG tablet; Take 1 tablet (20 mg total) by mouth 2 (two) times daily.  Dispense: 180 tablet; Refill: 3  3. Controlled type 2 diabetes mellitus with stage 3 chronic kidney disease, without long-term current use of insulin ([place]) - Per endocrinology  - CBC with Differential/Platelet; Future - Comprehensive metabolic panel; Future - Lipid panel; Future - TSH; Future  4. Hyperlipidemia, unspecified hyperlipidemia type - Consider increase in statin  - CBC with Differential/Platelet; Future - Comprehensive metabolic panel; Future - Lipid panel; Future - TSH; Future  5. Gastroesophageal reflux disease, unspecified whether esophagitis present - Continue PPI  - CBC with Differential/Platelet; Future - Comprehensive metabolic panel; Future - Lipid panel; Future - TSH; Future  6. Prostate cancer screening  - PSA; Future  7. Obesity (BMI 30.0-34.9) - Continue to work on [employer] los through diet and exercise - CBC with Differential/Platelet; Future - Comprehensive metabolic panel; Future - Lipid panel; Future - TSH; Future   Dorothyann Peng, NP

## 2022-04-06 MED ORDER — ROSUVASTATIN CALCIUM 40 MG PO TABS: 40.0000 mg | ORAL_TABLET | Freq: Every day | ORAL | 3 refills | Status: DC

## 2022-04-06 NOTE — Addendum Note (Signed)
Addended by: Waymon Amato R on: 04/06/2022 04:19 PM   Modules accepted: Orders

## 2022-05-04 ENCOUNTER — Ambulatory Visit (INDEPENDENT_AMBULATORY_CARE_PROVIDER_SITE_OTHER): Payer: Medicare Other | Admitting: Podiatry

## 2022-05-04 ENCOUNTER — Encounter: Payer: Self-pay | Admitting: Podiatry

## 2022-05-04 DIAGNOSIS — M79674 Pain in right toe(s): Secondary | ICD-10-CM | POA: Diagnosis not present

## 2022-05-04 DIAGNOSIS — E1121 Type 2 diabetes mellitus with diabetic nephropathy: Secondary | ICD-10-CM | POA: Diagnosis not present

## 2022-05-04 DIAGNOSIS — B351 Tinea unguium: Secondary | ICD-10-CM | POA: Diagnosis not present

## 2022-05-04 DIAGNOSIS — M79675 Pain in left toe(s): Secondary | ICD-10-CM

## 2022-05-04 DIAGNOSIS — L84 Corns and callosities: Secondary | ICD-10-CM

## 2022-05-13 NOTE — Progress Notes (Signed)
  Subjective:  Patient ID: Billy Hansen, male    DOB: 12-Nov-1945,  MRN: [id number]  76 y.o. male presents at risk foot care. Pt has h/o NIDDM with PAD and painful elongated mycotic toenails 1-5 bilaterally which are tender when wearing enclosed shoe gear. Pain is relieved with periodic professional debridement.  Last known  HgA1c was 6.5%.  Patient did not check blood glucose this morning.  New problem(s): None   PCP is [name], NP , and last visit was 04/05/2022.  Allergies  Allergen Reactions   Januvia [Sitagliptin] Other (See Comments)    HA, fatigue   Naproxen Sodium Itching   Other     Some type of anesthetic following colonoscopy/ notes states Propofol   Review of Systems: Negative except as noted in the HPI.   Objective:  Billy Hansen is a 76 y.o. male in NAD. AAO x 3.  Vascular Examination: CFT <3 seconds b/l LE. Palpable DP pulse(s) b/l LE. Diminished PT pulse(s) b/l LE. Pedal hair sparse. No pain with calf compression b/l. Lower extremity skin temperature gradient within normal limits. No edema noted b/l LE. No ischemia or gangrene noted b/l LE. No cyanosis or clubbing noted b/l LE.  Neurological Examination: Sensation grossly intact b/l with 10 gram monofilament. Vibratory sensation intact b/l.   Dermatological Examination: Pedal skin with normal turgor, texture and tone b/l. Toenails 1-5 b/l thick, discolored, elongated with subungual debris and pain on dorsal palpation. No hyperkeratotic lesions noted b/l. Incurvated nailplate medial border right hallux.  Nail border hypertrophy absent. There is tenderness to palpation. Sign(s) of infection: no clinical signs of infection noted on examination today.. Hyperkeratotic lesion(s) distal tip of right 2nd toe.  No erythema, no edema, no drainage, no fluctuance.  Musculoskeletal Examination: Muscle strength 5/5 to b/l LE. Muscle strength 5/5 to all lower extremity muscle groups bilaterally. No pain, crepitus or joint  limitation noted with ROM bilateral LE. No gross bony deformities bilaterally. Patient ambulates independent of any assistive aids.  Radiographs: None  Last A1c:      Latest Ref Rng & Units 02/15/2022   11:03 AM  Hemoglobin A1C  Hemoglobin-A1c 4.0 - 5.6 % 7.5    Assessment:   1. Pain due to onychomycosis of toenails of both feet   2. Corns   3. Type 2 diabetes mellitus with diabetic nephropathy, without long-term current use of insulin (HCC)    Plan:  -Examined patient. -Continue foot and shoe inspections daily. Monitor blood glucose per PCP/Endocrinologist's recommendations. -Patient to continue soft, supportive shoe gear daily. -Toenails 1-5 b/l were debrided in length and girth with sterile nail nippers and dremel without iatrogenic bleeding.  -Offending nail border debrided and curretaged medial border right hallux utilizing sterile nail nipper and currette. Border cleansed with alcohol and triple antibiotic applied. No further treatment required by patient/caregiver. Call office if there are any concerns. -Corn(s) distal tip of right 2nd toe pared utilizing sterile scalpel blade without complication or incident. Total number debrided=1. -Dispensed silicone toe cap. Apply to R 2nd toe every morning. Remove every evening. -Patient/POA to call should there be question/concern in the interim.  Return in about 3 months (around 08/04/2022).  Freddie Breech, DPM

## 2022-05-24 ENCOUNTER — Telehealth: Payer: Self-pay | Admitting: Adult Health

## 2022-05-24 NOTE — Telephone Encounter (Signed)
Left message for patient to call back and schedule Medicare Annual Wellness Visit (AWV) either virtually or in office. Left  my jabber number [phone]   Last AWV ;05/25/21  please schedule at anytime with [facility] Nurse Health Advisor 1 or 2    

## 2022-05-29 ENCOUNTER — Ambulatory Visit (INDEPENDENT_AMBULATORY_CARE_PROVIDER_SITE_OTHER): Payer: Medicare Other

## 2022-05-29 VITALS — BP 128/76 | HR 76 | Temp 98.0°F | Ht 69.5 in | Wt 228.1 lb

## 2022-05-29 DIAGNOSIS — Z23 Encounter for immunization: Secondary | ICD-10-CM | POA: Diagnosis not present

## 2022-05-29 DIAGNOSIS — Z0000 Encounter for general adult medical examination without abnormal findings: Secondary | ICD-10-CM | POA: Diagnosis not present

## 2022-05-29 NOTE — Patient Instructions (Signed)
Billy Hansen , Thank you for taking time to come for your Medicare Wellness Visit. I appreciate your ongoing commitment to your health goals. Please review the following plan we discussed and let me know if I can assist you in the future.   Screening recommendations/referrals: Colonoscopy: completed 02/01/2016 Recommended yearly ophthalmology/optometry visit for glaucoma screening and checkup Recommended yearly dental visit for hygiene and checkup  Vaccinations: Influenza vaccine: today Pneumococcal vaccine: completed 04/01/2020 Tdap vaccine: completed 09/25/2016, due 09/25/2026 Shingles vaccine: discussed   Covid-19:  09/12/2020, 12/09/2019, 11/13/2019  Advanced directives: Please bring a copy of your POA (Power of Attorney) and/or Living Will to your next appointment.   Conditions/risks identified: none  Next appointment: Follow up in one year for your annual wellness visit.   Preventive Care 72 Years and Older, Male Preventive care refers to lifestyle choices and visits with your health care provider that can promote health and wellness. What does preventive care include? A yearly physical exam. This is also called an annual well check. Dental exams once or twice a year. Routine eye exams. Ask your health care provider how often you should have your eyes checked. Personal lifestyle choices, including: Daily care of your teeth and gums. Regular physical activity. Eating a healthy diet. Avoiding tobacco and drug use. Limiting alcohol use. Practicing safe sex. Taking low doses of aspirin every day. Taking vitamin and mineral supplements as recommended by your health care provider. What happens during an annual well check? The services and screenings done by your health care provider during your annual well check will depend on your age, overall health, lifestyle risk factors, and family history of disease. Counseling  Your health care provider may ask you questions about your: Alcohol  use. Tobacco use. Drug use. Emotional well-being. Home and relationship well-being. Sexual activity. Eating habits. History of falls. Memory and ability to understand (cognition). Work and work Astronomer. Screening  You may have the following tests or measurements: Height, weight, and BMI. Blood pressure. Lipid and cholesterol levels. These may be checked every 5 years, or more frequently if you are over 62 years old. Skin check. Lung cancer screening. You may have this screening every year starting at age 54 if you have a 30-pack-year history of smoking and currently smoke or have quit within the past 15 years. Fecal occult blood test (FOBT) of the stool. You may have this test every year starting at age 66. Flexible sigmoidoscopy or colonoscopy. You may have a sigmoidoscopy every 5 years or a colonoscopy every 10 years starting at age 24. Prostate cancer screening. Recommendations will vary depending on your family history and other risks. Hepatitis C blood test. Hepatitis B blood test. Sexually transmitted disease (STD) testing. Diabetes screening. This is done by checking your blood sugar (glucose) after you have not eaten for a while (fasting). You may have this done every 1-3 years. Abdominal aortic aneurysm (AAA) screening. You may need this if you are a current or former smoker. Osteoporosis. You may be screened starting at age 92 if you are at high risk. Talk with your health care provider about your test results, treatment options, and if necessary, the need for more tests. Vaccines  Your health care provider may recommend certain vaccines, such as: Influenza vaccine. This is recommended every year. Tetanus, diphtheria, and acellular pertussis (Tdap, Td) vaccine. You may need a Td booster every 10 years. Zoster vaccine. You may need this after age 43. Pneumococcal 13-valent conjugate (PCV13) vaccine. One dose is recommended after  age 60. Pneumococcal polysaccharide  (PPSV23) vaccine. One dose is recommended after age 66. Talk to your health care provider about which screenings and vaccines you need and how often you need them. This information is not intended to replace advice given to you by your health care provider. Make sure you discuss any questions you have with your health care provider. Document Released: 09/30/2015 Document Revised: 05/23/2016 Document Reviewed: 07/05/2015 Elsevier Interactive Patient Education  2017 [place] Prevention in the Home Falls can cause injuries. They can happen to people of all ages. There are many things you can do to make your home safe and to help prevent falls. What can I do on the outside of my home? Regularly fix the edges of walkways and driveways and fix any cracks. Remove anything that might make you trip as you walk through a door, such as a raised step or threshold. Trim any bushes or trees on the path to your home. Use bright outdoor lighting. Clear any walking paths of anything that might make someone trip, such as rocks or tools. Regularly check to see if handrails are loose or broken. Make sure that both sides of any steps have handrails. Any raised decks and porches should have guardrails on the edges. Have any leaves, snow, or ice cleared regularly. Use sand or salt on walking paths during winter. Clean up any spills in your garage right away. This includes oil or grease spills. What can I do in the bathroom? Use night lights. Install grab bars by the toilet and in the tub and shower. Do not use towel bars as grab bars. Use non-skid mats or decals in the tub or shower. If you need to sit down in the shower, use a plastic, non-slip stool. Keep the floor dry. Clean up any water that spills on the floor as soon as it happens. Remove soap buildup in the tub or shower regularly. Attach bath mats securely with double-sided non-slip rug tape. Do not have throw rugs and other things on the  floor that can make you trip. What can I do in the bedroom? Use night lights. Make sure that you have a light by your bed that is easy to reach. Do not use any sheets or blankets that are too big for your bed. They should not hang down onto the floor. Have a firm chair that has side arms. You can use this for support while you get dressed. Do not have throw rugs and other things on the floor that can make you trip. What can I do in the kitchen? Clean up any spills right away. Avoid walking on wet floors. Keep items that you use a lot in easy-to-reach places. If you need to reach something above you, use a strong step stool that has a grab bar. Keep electrical cords out of the way. Do not use floor polish or wax that makes floors slippery. If you must use wax, use non-skid floor wax. Do not have throw rugs and other things on the floor that can make you trip. What can I do with my stairs? Do not leave any items on the stairs. Make sure that there are handrails on both sides of the stairs and use them. Fix handrails that are broken or loose. Make sure that handrails are as long as the stairways. Check any carpeting to make sure that it is firmly attached to the stairs. Fix any carpet that is loose or worn. Avoid having throw rugs  at the top or bottom of the stairs. If you do have throw rugs, attach them to the floor with carpet tape. Make sure that you have a light switch at the top of the stairs and the bottom of the stairs. If you do not have them, ask someone to add them for you. What else can I do to help prevent falls? Wear shoes that: Do not have high heels. Have rubber bottoms. Are comfortable and fit you well. Are closed at the toe. Do not wear sandals. If you use a stepladder: Make sure that it is fully opened. Do not climb a closed stepladder. Make sure that both sides of the stepladder are locked into place. Ask someone to hold it for you, if possible. Clearly mark and make  sure that you can see: Any grab bars or handrails. First and last steps. Where the edge of each step is. Use tools that help you move around (mobility aids) if they are needed. These include: Canes. Walkers. Scooters. Crutches. Turn on the lights when you go into a dark area. Replace any light bulbs as soon as they burn out. Set up your furniture so you have a clear path. Avoid moving your furniture around. If any of your floors are uneven, fix them. If there are any pets around you, be aware of where they are. Review your medicines with your doctor. Some medicines can make you feel dizzy. This can increase your chance of falling. Ask your doctor what other things that you can do to help prevent falls. This information is not intended to replace advice given to you by your health care provider. Make sure you discuss any questions you have with your health care provider. Document Released: 06/30/2009 Document Revised: 02/09/2016 Document Reviewed: 10/08/2014 Elsevier Interactive Patient Education  2017 [employer].

## 2022-05-29 NOTE — Progress Notes (Signed)
Subjective:   Billy Hansen is a 76 y.o. male who presents for Medicare Annual/Subsequent preventive examination.  Review of Systems     Cardiac Risk Factors include: advanced age (>60mn, >>86women);diabetes mellitus;dyslipidemia;hypertension;male gender;obesity (BMI >30kg/m2)     Objective:    Today's Vitals   05/29/22 1353  BP: 128/76  Pulse: 76  Temp: 98 F (36.7 C)  TempSrc: Oral  SpO2: 96%  Weight: 228 lb 1.6 oz (103.5 kg)  Height: 5' 9.5" (1.765 m)   Body mass index is 33.2 kg/m.     05/29/2022    2:03 PM 05/25/2021    2:41 PM 05/13/2020    9:51 AM 04/28/2018    8:31 AM 01/18/2016    7:51 AM 05/17/2015    5:41 PM  Advanced Directives  Does Patient Have a Medical Advance Directive? Yes _0   Type of Advance Directive Living will       Would patient like information on creating a medical advance directive?  No - Patient declined No - Patient declined   No - patient declined information    Current Medications (verified) Outpatient Encounter Medications as of 05/29/2022  Medication Sig   ACCU-CHEK GUIDE test strip TEST ONCE DAILY   Accu-Chek Softclix Lancets lancets 1 each by Other route 3 (three) times daily. E11.9   aspirin 81 MG tablet Take 81 mg by mouth daily.   Blood Glucose Monitoring Suppl (ACCU-CHEK GUIDE) w/Device KIT 1 each by Does not apply route 3 (three) times daily. E11.9   clotrimazole-betamethasone (LOTRISONE) cream Apply to feet and between toes bid x 6 weeks.   FARXIGA 10 MG TABS tablet TAKE 1 TABLET BY MOUTH  DAILY BEFORE BREAKFAST   lisinopril (ZESTRIL) 20 MG tablet Take 1 tablet (20 mg total) by mouth 2 (two) times daily.   metFORMIN (GLUCOPHAGE) 1000 MG tablet TAKE 1 TABLET BY MOUTH TWICE  DAILY WITH MEALS   rosuvastatin (CRESTOR) 40 MG tablet Take 1 tablet (40 mg total) by mouth daily.   RYBELSUS 7 MG TABS TAKE 1 TABLET BY MOUTH  DAILY   sildenafil (REVATIO) 20 MG tablet TAKE ONE TO TWO TABLETS BY MOUTH TWO HOURS PRIOR TO SEX    No facility-administered encounter medications on file as of 05/29/2022.    Allergies (verified) Januvia [sitagliptin], Naproxen sodium, and Other   History: Past Medical History:  Diagnosis Date   Allergy    Arthritis    Diabetes mellitus without complication ([place]    GERD (gastroesophageal reflux disease)    Hyperlipidemia    Hypertension    Past Surgical History:  Procedure Laterality Date   COLON SURGERY  2005   due to injury at work per pt.    COLON SURGERY  2006   revision of ostomy bag   COLONOSCOPY  2006, 2017   KNEE SURGERY     right knee   Family History  Problem Relation Age of Onset   Cancer Mother    Diabetes Brother    Colon cancer Neg Hx    Social History   Socioeconomic History   Marital status: Married    Spouse name: Not on file   Number of children: Not on file   Years of education: Not on file   Highest education level: Not on file  Occupational History   Not on file  Tobacco Use   Smoking status: Never   Smokeless tobacco: Never  Vaping Use   Vaping Use: Never used  Substance and  Sexual Activity   Alcohol use: No   Drug use: No   Sexual activity: Yes    Partners: Female  Other Topics Concern   Not on file  Social History Narrative   Regular exercise: walk   Caffeine use: 1 cup of coffee         Social Determinants of Health   Financial Resource Strain: Low Risk  (05/29/2022)   Overall Financial Resource Strain (CARDIA)    Difficulty of Paying Living Expenses: Not hard at all  Food Insecurity: No Food Insecurity (05/29/2022)   Hunger Vital Sign    Worried About Running Out of Food in the Last Year: Never true    Ran Out of Food in the Last Year: Never true  Transportation Needs: No Transportation Needs (05/29/2022)   PRAPARE - Hydrologist (Medical): No    Lack of Transportation (Non-Medical): No  Physical Activity: Insufficiently Active (05/29/2022)   Exercise Vital Sign    Days of Exercise per  Week: 5 days    Minutes of Exercise per Session: 20 min  Stress: No Stress Concern Present (05/29/2022)   [place]    Feeling of Stress : Not at all  Social Connections: Unknown (01/18/2022)   Social Connection and Isolation Panel [NHANES]    Frequency of Communication with Friends and Family: Patient refused    Frequency of Social Gatherings with Friends and Family: Patient refused    Attends Religious Services: Patient refused    Marine scientist or Organizations: Patient refused    Attends Music therapist: Not on file    Marital Status: Patient refused    Tobacco Counseling Counseling given: Not Answered   Clinical Intake:  Pre-visit preparation completed: Yes  Pain : No/denies pain     Nutritional Status: BMI > 30  Obese Nutritional Risks: None Diabetes: Yes  How often do you need to have someone help you when you read instructions, pamphlets, or other written materials from your doctor or pharmacy?: 1 - Never What is the last grade level you completed in school?: 8th grade  Diabetic? Yes Nutrition Risk Assessment:  Has the patient had any N/V/D within the last 2 months?  No  Does the patient have any non-healing wounds?  No  Has the patient had any unintentional weight loss or weight gain?  No   Diabetes:  Is the patient diabetic?  Yes  If diabetic, was a CBG obtained today?  No  Did the patient bring in their glucometer from home?  No  How often do you monitor your CBG's? Every other day.   Financial Strains and Diabetes Management:  Are you having any financial strains with the device, your supplies or your medication? No .  Does the patient want to be seen by Chronic Care Management for management of their diabetes?  No  Would the patient like to be referred to a Nutritionist or for Diabetic Management?  No   Diabetic Exams:  Diabetic Eye Exam: Completed  10/02/2021 Diabetic Foot Exam: Completed 04/05/2022   Interpreter Needed?: No  Information entered by :: NAllen LPN   Activities of Daily Living    05/29/2022    2:07 PM 01/10/2022    9:53 AM  In your present state of health, do you have any difficulty performing the following activities:  Hearing? 1 0  Comment trouble in crowds   Vision? 0 0  Difficulty concentrating or  making decisions? 0 1  Walking or climbing stairs? 1 1  Dressing or bathing? 0 0  Doing errands, shopping? 0 0  Preparing Food and eating ? N   Using the Toilet? N   In the past six months, have you accidently leaked urine? Y   Do you have problems with loss of bowel control? N   Managing your Medications? N   Managing your Finances? N   Housekeeping or managing your Housekeeping? N     Patient Care Team: Dorothyann Peng, NP as PCP - General (Family Medicine)  Indicate any recent Medical Services you may have received from other than [facility] providers in the past year (date may be approximate).     Assessment:   This is a routine wellness examination for [place].  Hearing/Vision screen Vision Screening - Comments:: Regular eye exams, Dr. Prudencio Burly  Dietary issues and exercise activities discussed: Current Exercise Habits: Home exercise routine, Type of exercise: calisthenics;Other - see comments (stationary bike), Time (Minutes): 20, Frequency (Times/Week): 5, Weekly Exercise (Minutes/Week): 100   Goals Addressed             This Visit's Progress    Patient Stated       05/29/2022, no goals       Depression Screen    05/29/2022    2:06 PM 04/05/2022    8:52 AM 01/10/2022    9:53 AM 05/25/2021    2:42 PM 05/25/2021    2:38 PM 04/04/2021    8:45 AM 05/13/2020    9:59 AM  PHQ 2/9 Scores  PHQ - 2 Score 0 0 0 0 0 0 0  PHQ- 9 Score       0    Fall Risk    05/29/2022    2:05 PM 04/05/2022    8:42 AM 01/18/2022    6:53 PM 01/10/2022    9:53 AM 05/25/2021    2:42 PM  Fall Risk   Falls in the past year? 0 1  0 0 0  Number falls in past yr: 0 0  0 0  Injury with Fall? 0 1  0 0  Risk for fall due to : Medication side effect History of fall(s)  No Fall Risks No Fall Risks  Follow up Falls prevention discussed;Education provided;Falls evaluation completed Falls evaluation completed  Falls evaluation completed Falls evaluation completed    FALL RISK PREVENTION PERTAINING TO THE HOME:  Any stairs in or around the home? No  If so, are there any without handrails?  N/a Home free of loose throw rugs in walkways, pet beds, electrical cords, etc? Yes  Adequate lighting in your home to reduce risk of falls? Yes   ASSISTIVE DEVICES UTILIZED TO PREVENT FALLS:  Life alert? No  Use of a cane, walker or w/c? No  Grab bars in the bathroom? Yes  Shower chair or bench in shower? No  Elevated toilet seat or a handicapped toilet? Yes   TIMED UP AND GO:  Was the test performed? Yes .  Length of time to ambulate 10 feet: 5 sec.   Gait steady and fast without use of assistive device  Cognitive Function:        05/29/2022    2:10 PM  6CIT Screen  What Year? 0 points  What month? 0 points  What time? 0 points  Count back from 20 0 points  Months in reverse 2 points  Repeat phrase 4 points  Total Score 6 points    Immunizations  Immunization History  Administered Date(s) Administered   Fluad Quad(high Dose 65+) 08/05/2019, 06/15/2021, 05/29/2022   Influenza Whole 06/24/2006, 07/07/2007, 08/03/2009   Influenza, High Dose Seasonal PF 07/18/2016, 09/30/2017   Influenza,inj,Quad PF,6+ Mos 07/27/2013, 08/16/2014, 07/15/2015, 07/30/2018   PFIZER(Purple Top)SARS-COV-2 Vaccination 11/13/2019, 12/09/2019, 09/12/2020   Pneumococcal Conjugate-13 08/16/2014   Pneumococcal Polysaccharide-23 10/29/2012, 04/01/2020   Td 04/02/2006   Tdap 09/25/2016    TDAP status: Up to date  Flu Vaccine status: Completed at today's visit  Pneumococcal vaccine status: Up to date  Covid-19 vaccine status: Completed  vaccines  Qualifies for Shingles Vaccine? Yes   Zostavax completed No   Shingrix Completed?: No.    Education has been provided regarding the importance of this vaccine. Patient has been advised to call insurance company to determine out of pocket expense if they have not yet received this vaccine. Advised may also receive vaccine at local pharmacy or Health Dept. Verbalized acceptance and understanding.  Screening Tests Health Maintenance  Topic Date Due   COVID-19 Vaccine (4 - Pfizer series) 11/07/2020   Zoster Vaccines- Shingrix (1 of 2) 07/06/2022 (Originally 11/03/1997)   HEMOGLOBIN A1C  08/17/2022   OPHTHALMOLOGY EXAM  10/02/2022   FOOT EXAM  04/06/2023   COLONOSCOPY (Pts 45-32yr Insurance coverage will need to be confirmed)  01/31/2026   TETANUS/TDAP  09/25/2026   Pneumonia Vaccine 74 Years old  Completed   INFLUENZA VACCINE  Completed   Hepatitis C Screening  Completed   HPV VACCINES  Aged Out    Health Maintenance  Health Maintenance Due  Topic Date Due   COVID-19 Vaccine (4 - Pfizer series) 11/07/2020    Colorectal cancer screening: Type of screening: Colonoscopy. Completed 02/01/2016. Repeat every 10 years  Lung Cancer Screening: (Low Dose CT Chest recommended if Age 74-80years, 30 pack-year currently smoking OR have quit w/in 15years.) does not qualify.   Lung Cancer Screening Referral: no  Additional Screening:  Hepatitis C Screening: does qualify; Completed 09/30/2017  Vision Screening: Recommended annual ophthalmology exams for early detection of glaucoma and other disorders of the eye. Is the patient up to date with their annual eye exam?  Yes  Who is the provider or what is the name of the office in which the patient attends annual eye exams? Dr. LPrudencio BurlyIf pt is not established with a provider, would they like to be referred to a provider to establish care? No .   Dental Screening: Recommended annual dental exams for proper oral hygiene  Community  Resource Referral / Chronic Care Management: CRR required this visit?  No   CCM required this visit?  No      Plan:     I have personally reviewed and noted the following in the patient's chart:   Medical and social history Use of alcohol, tobacco or illicit drugs  Current medications and supplements including opioid prescriptions. Patient is not currently taking opioid prescriptions. Functional ability and status Nutritional status Physical activity Advanced directives List of other physicians Hospitalizations, surgeries, and ER visits in previous 12 months Vitals Screenings to include cognitive, depression, and falls Referrals and appointments  In addition, I have reviewed and discussed with patient certain preventive protocols, quality metrics, and best practice recommendations. A written personalized care plan for preventive services as well as general preventive health recommendations were provided to patient.     NKellie Simmering LPN   [id number]  Nurse Notes: none

## 2022-06-08 DIAGNOSIS — H9113 Presbycusis, bilateral: Secondary | ICD-10-CM | POA: Diagnosis not present

## 2022-06-11 ENCOUNTER — Other Ambulatory Visit: Payer: Self-pay | Admitting: Internal Medicine

## 2022-06-11 DIAGNOSIS — E1121 Type 2 diabetes mellitus with diabetic nephropathy: Secondary | ICD-10-CM

## 2022-06-21 ENCOUNTER — Ambulatory Visit: Payer: Medicare Other | Admitting: Internal Medicine

## 2022-06-26 ENCOUNTER — Ambulatory Visit (INDEPENDENT_AMBULATORY_CARE_PROVIDER_SITE_OTHER): Payer: Medicare Other | Admitting: Internal Medicine

## 2022-06-26 ENCOUNTER — Encounter: Payer: Self-pay | Admitting: Internal Medicine

## 2022-06-26 VITALS — BP 140/84 | HR 92 | Ht 69.5 in | Wt 228.8 lb

## 2022-06-26 DIAGNOSIS — E669 Obesity, unspecified: Secondary | ICD-10-CM

## 2022-06-26 DIAGNOSIS — E785 Hyperlipidemia, unspecified: Secondary | ICD-10-CM | POA: Diagnosis not present

## 2022-06-26 DIAGNOSIS — E1121 Type 2 diabetes mellitus with diabetic nephropathy: Secondary | ICD-10-CM

## 2022-06-26 LAB — POCT GLYCOSYLATED HEMOGLOBIN (HGB A1C): Hemoglobin A1C: 8 % — AB (ref 4.0–5.6)

## 2022-06-26 NOTE — Patient Instructions (Addendum)
Please continue: - Metformin 1000 mg 2x a day with meals - Farxiga 10 mg before b'fast - Rybelsus 7 mg before b'fast  Please return in 6 months with your sugar log.
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
## 2022-07-01 LAB — FRUCTOSAMINE: Fructosamine: 359 umol/L — ABNORMAL HIGH (ref 205–285)

## 2022-07-22 ENCOUNTER — Encounter: Payer: Self-pay | Admitting: Internal Medicine

## 2022-08-11 ENCOUNTER — Other Ambulatory Visit: Payer: Self-pay | Admitting: Internal Medicine

## 2022-08-20 ENCOUNTER — Encounter: Payer: Self-pay | Admitting: Podiatry

## 2022-08-20 ENCOUNTER — Ambulatory Visit (INDEPENDENT_AMBULATORY_CARE_PROVIDER_SITE_OTHER): Payer: Medicare Other | Admitting: Podiatry

## 2022-08-20 VITALS — BP 143/80

## 2022-08-20 DIAGNOSIS — M79674 Pain in right toe(s): Secondary | ICD-10-CM

## 2022-08-20 DIAGNOSIS — B351 Tinea unguium: Secondary | ICD-10-CM | POA: Diagnosis not present

## 2022-08-20 DIAGNOSIS — E1121 Type 2 diabetes mellitus with diabetic nephropathy: Secondary | ICD-10-CM

## 2022-08-20 DIAGNOSIS — L84 Corns and callosities: Secondary | ICD-10-CM

## 2022-08-20 DIAGNOSIS — M79675 Pain in left toe(s): Secondary | ICD-10-CM | POA: Diagnosis not present

## 2022-08-20 DIAGNOSIS — Q828 Other specified congenital malformations of skin: Secondary | ICD-10-CM | POA: Diagnosis not present

## 2022-08-20 NOTE — Progress Notes (Signed)
  Subjective:  Patient ID: Billy Hansen, male    DOB: 1946/02/21,  MRN: [id number]  Billy Hansen presents to clinic today for at risk foot care. Pt has h/o NIDDM with chronic kidney disease and callus(es) b/l lower extremities, porokeratotic lesion(s) right lower extremity, and painful mycotic nails. Painful toenails interfere with ambulation. Aggravating factors include wearing enclosed shoe gear. Pain is relieved with periodic professional debridement. Painful callus(es) and porokeratotic lesion(s) are aggravated when weightbearing with and without shoegear. Pain is relieved with periodic professional debridement.  Chief Complaint  Patient presents with   Nail Problem    Diabetic foot care BS- has not check yet A1C-7.? PCP- Nafziger PCP VST-03/2022    New problem(s): None.   PCP is [name], NP.  Allergies  Allergen Reactions   Januvia [Sitagliptin] Other (See Comments)    HA, fatigue   Naproxen Sodium Itching   Other     Some type of anesthetic following colonoscopy/ notes states Propofol    Review of Systems: Negative except as noted in the HPI.  Objective: No changes noted in today's physical examination. Vitals:   08/20/22 0952  BP: (!) 143/80   Billy Hansen is a pleasant 76 y.o. male in NAD. AAO x 3.  Vascular Examination: CFT <3 seconds b/l LE. Palpable DP pulse(s) b/l LE. Diminished PT pulse(s) b/l LE. Pedal hair sparse. No pain with calf compression b/l. Lower extremity skin temperature gradient within normal limits. No edema noted b/l LE. No ischemia or gangrene noted b/l LE. No cyanosis or clubbing noted b/l LE.  Neurological Examination: Sensation grossly intact b/l with 10 gram monofilament. Vibratory sensation intact b/l.   Dermatological Examination: Pedal skin with normal turgor, texture and tone b/l. Toenails 1-5 b/l thick, discolored, elongated with subungual debris and pain on dorsal palpation.  Porokeratotic lesion(s) distal tip of  right 2nd toe.  Hyperkeratotic lesions submet head 5 b/l. No erythema, no edema, no drainage, no fluctuance.  Musculoskeletal Examination: Muscle strength 5/5 to b/l LE. Muscle strength 5/5 to all lower extremity muscle groups bilaterally. No pain, crepitus or joint limitation noted with ROM bilateral LE. No gross bony deformities bilaterally. Patient ambulates independent of any assistive aids.  Radiographs: None  Assessment/Plan: 1. Pain due to onychomycosis of toenails of both feet   2. Porokeratosis   3. Callus   4. Type 2 diabetes mellitus with diabetic nephropathy, without long-term current use of insulin (HCC)     No orders of the defined types were placed in this encounter.   None -Patient was evaluated and treated. All patient's and/or POA's questions/concerns answered on today's visit. -Continue diabetic foot care principles: inspect feet daily, monitor glucose as recommended by PCP and/or Endocrinologist, and follow prescribed diet per PCP, Endocrinologist and/or dietician. -Continue supportive shoe gear daily. -Mycotic toenails 1-5 bilaterally were debrided in length and girth with sterile nail nippers and dremel. Pinpoint bleeding of right second digit addressed with Lumicain Hemostatic Solution, cleansed with alcohol. triple antibiotic ointment applied. Patient/caregiver instructed to apply Neosporin Cream once daily for 7 days. -Callus(es) submet head 5 b/l pared utilizing rotary bur without complication or incident. Total number pared =2. -Porokeratotic lesion(s) distal tip of right 2nd toe pared and enucleated with sterile currette without incident. Total number of lesions debrided=1. -Patient/POA to call should there be question/concern in the interim.   Return in about 3 months (around 11/19/2022).  Freddie Breech, DPM

## 2022-09-04 ENCOUNTER — Telehealth: Payer: Self-pay | Admitting: Internal Medicine

## 2022-09-04 NOTE — Telephone Encounter (Signed)
Patient came by office and gave letter from insurance about Rybelsus  not being covered next year.  Letter is in Dr. Charlean Sanfilippo folder in front office.

## 2022-09-05 ENCOUNTER — Other Ambulatory Visit (HOSPITAL_COMMUNITY): Payer: Self-pay

## 2022-09-05 ENCOUNTER — Telehealth: Payer: Self-pay

## 2022-09-05 NOTE — Telephone Encounter (Signed)
T, I looked at the letter and apparently they do require preauthorization for this.  Please forward to the PA team.

## 2022-09-05 NOTE — Telephone Encounter (Signed)
Started PA, please see chart notes for update

## 2022-09-05 NOTE — Telephone Encounter (Signed)
Pharmacy Patient Advocate Encounter   Received notification from [facility] that prior authorization for RYBELSUS 7 MG is needed.    PA submitted on 09/05/22 [id number] Status is pending  Haze Rushing, CPhT Pharmacy Patient Advocate Specialist Direct Number: [phone] Fax: [phone]

## 2022-09-05 NOTE — Telephone Encounter (Signed)
Pharmacy Patient Advocate Encounter  Prior Authorization for  RYBELSUS 7 MG  has been approved.    PA# [id number] Effective dates: 09/05/22 through 09/17/23  Haze Rushing, CPhT Pharmacy Patient Advocate Specialist Direct Number: [phone] Fax: [phone]

## 2022-10-11 ENCOUNTER — Encounter: Payer: Self-pay | Admitting: Internal Medicine

## 2022-10-11 DIAGNOSIS — H2513 Age-related nuclear cataract, bilateral: Secondary | ICD-10-CM | POA: Diagnosis not present

## 2022-10-11 DIAGNOSIS — H40013 Open angle with borderline findings, low risk, bilateral: Secondary | ICD-10-CM | POA: Diagnosis not present

## 2022-10-11 DIAGNOSIS — E119 Type 2 diabetes mellitus without complications: Secondary | ICD-10-CM | POA: Diagnosis not present

## 2022-10-11 DIAGNOSIS — H5203 Hypermetropia, bilateral: Secondary | ICD-10-CM | POA: Diagnosis not present

## 2022-10-11 LAB — HM DIABETES EYE EXAM

## 2022-11-27 ENCOUNTER — Encounter: Payer: Self-pay | Admitting: Adult Health

## 2022-12-04 ENCOUNTER — Ambulatory Visit (INDEPENDENT_AMBULATORY_CARE_PROVIDER_SITE_OTHER): Payer: 59 | Admitting: Podiatry

## 2022-12-04 VITALS — BP 145/86

## 2022-12-04 DIAGNOSIS — M79675 Pain in left toe(s): Secondary | ICD-10-CM | POA: Diagnosis not present

## 2022-12-04 DIAGNOSIS — Q828 Other specified congenital malformations of skin: Secondary | ICD-10-CM

## 2022-12-04 DIAGNOSIS — B351 Tinea unguium: Secondary | ICD-10-CM

## 2022-12-04 DIAGNOSIS — L84 Corns and callosities: Secondary | ICD-10-CM

## 2022-12-04 DIAGNOSIS — E1121 Type 2 diabetes mellitus with diabetic nephropathy: Secondary | ICD-10-CM | POA: Diagnosis not present

## 2022-12-04 DIAGNOSIS — M79674 Pain in right toe(s): Secondary | ICD-10-CM | POA: Diagnosis not present

## 2022-12-04 NOTE — Progress Notes (Unsigned)
  Subjective:  Patient ID: Billy Hansen, male    DOB: 1946-03-25,  MRN: [id number]  DUPREE HYERS presents to clinic today for {jgcomplaint:23593} No chief complaint on file.  New problem(s): None. {jgcomplaint:23593}  PCP is Dorothyann Peng, NP.  Allergies  Allergen Reactions   Januvia [Sitagliptin] Other (See Comments)    HA, fatigue   Naproxen Sodium Itching   Other     Some type of anesthetic following colonoscopy/ notes states Propofol    Review of Systems: Negative except as noted in the HPI.  Objective: No changes noted in today's physical examination. There were no vitals filed for this visit. Billy Hansen is a pleasant 77 y.o. male {jgbodyhabitus:24098} AAO x 3.  Vascular Examination: CFT <3 seconds b/l LE. Palpable DP pulse(s) b/l LE. Diminished PT pulse(s) b/l LE. Pedal hair sparse. No pain with calf compression b/l. Lower extremity skin temperature gradient within normal limits. No edema noted b/l LE. No ischemia or gangrene noted b/l LE. No cyanosis or clubbing noted b/l LE.  Neurological Examination: Sensation grossly intact b/l with 10 gram monofilament. Vibratory sensation intact b/l.   Dermatological Examination: Pedal skin with normal turgor, texture and tone b/l. Toenails 1-5 b/l thick, discolored, elongated with subungual debris and pain on dorsal palpation.  Porokeratotic lesion(s) distal tip of right 2nd toe.  Hyperkeratotic lesions submet head 5 b/l. No erythema, no edema, no drainage, no fluctuance.  Musculoskeletal Examination: Muscle strength 5/5 to b/l LE. Muscle strength 5/5 to all lower extremity muscle groups bilaterally. No pain, crepitus or joint limitation noted with ROM bilateral LE. No gross bony deformities bilaterally. Patient ambulates independent of any assistive aids.  Assessment/Plan: 1. Pain due to onychomycosis of toenails of both feet   2. Porokeratosis   3. Callus   4. Type 2 diabetes mellitus with diabetic nephropathy,  without long-term current use of insulin (HCC)     No orders of the defined types were placed in this encounter.   None {Jgplan:23602::"-Patient/POA to call should there be question/concern in the interim."}   Return in about 3 months (around 03/06/2023).  Marzetta Board, DPM

## 2022-12-06 ENCOUNTER — Encounter: Payer: Self-pay | Admitting: Podiatry

## 2022-12-12 ENCOUNTER — Other Ambulatory Visit: Payer: Self-pay | Admitting: Adult Health

## 2022-12-12 DIAGNOSIS — I10 Essential (primary) hypertension: Secondary | ICD-10-CM

## 2022-12-21 ENCOUNTER — Other Ambulatory Visit: Payer: Self-pay | Admitting: Internal Medicine

## 2022-12-21 DIAGNOSIS — E1121 Type 2 diabetes mellitus with diabetic nephropathy: Secondary | ICD-10-CM
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
## 2022-12-26 ENCOUNTER — Encounter: Payer: Self-pay | Admitting: Internal Medicine

## 2022-12-26 ENCOUNTER — Ambulatory Visit (INDEPENDENT_AMBULATORY_CARE_PROVIDER_SITE_OTHER): Payer: 59 | Admitting: Internal Medicine

## 2022-12-26 VITALS — BP 140/84 | HR 85 | Ht 69.5 in | Wt 224.6 lb

## 2022-12-26 DIAGNOSIS — E1121 Type 2 diabetes mellitus with diabetic nephropathy: Secondary | ICD-10-CM | POA: Diagnosis not present

## 2022-12-26 DIAGNOSIS — E669 Obesity, unspecified: Secondary | ICD-10-CM | POA: Diagnosis not present

## 2022-12-26 DIAGNOSIS — E785 Hyperlipidemia, unspecified: Secondary | ICD-10-CM | POA: Diagnosis not present

## 2022-12-26 LAB — POCT GLYCOSYLATED HEMOGLOBIN (HGB A1C): Hemoglobin A1C: 7.7 % — AB (ref 4.0–5.6)

## 2022-12-26 MED ORDER — RYBELSUS 7 MG PO TABS: 1.0000 | ORAL_TABLET | Freq: Every day | ORAL | 3 refills | Status: DC

## 2022-12-26 MED ORDER — METFORMIN HCL 1000 MG PO TABS: 1000.0000 mg | ORAL_TABLET | Freq: Two times a day (BID) | ORAL | 3 refills | Status: DC

## 2022-12-26 MED ORDER — DAPAGLIFLOZIN PROPANEDIOL 10 MG PO TABS: 10.0000 mg | ORAL_TABLET | Freq: Every day | ORAL | 3 refills | Status: DC

## 2022-12-26 NOTE — Patient Instructions (Signed)
Please continue: - Metformin 1000 mg 2x a day with meals - Farxiga 10 mg before b'fast - Rybelsus 7 mg before b'fast  Please return in 6 months with your sugar log.  

## 2022-12-30 LAB — FRUCTOSAMINE: Fructosamine: 317 umol/L — ABNORMAL HIGH (ref 205–285)

## 2023-01-13 ENCOUNTER — Emergency Department (HOSPITAL_COMMUNITY): Payer: 59

## 2023-01-13 ENCOUNTER — Emergency Department (HOSPITAL_COMMUNITY)
Admission: EM | Admit: 2023-01-13 | Discharge: 2023-01-13 | Disposition: A | Discharge status: Home / Self Care | Payer: 59 | Attending: Emergency Medicine | Admitting: Emergency Medicine

## 2023-01-13 ENCOUNTER — Encounter (HOSPITAL_COMMUNITY): Payer: Self-pay

## 2023-01-13 ENCOUNTER — Other Ambulatory Visit: Payer: Self-pay

## 2023-01-13 DIAGNOSIS — R9389 Abnormal findings on diagnostic imaging of other specified body structures: Secondary | ICD-10-CM | POA: Diagnosis not present

## 2023-01-13 DIAGNOSIS — M25551 Pain in right hip: Secondary | ICD-10-CM | POA: Diagnosis not present

## 2023-01-13 DIAGNOSIS — S199XXA Unspecified injury of neck, initial encounter: Secondary | ICD-10-CM | POA: Diagnosis not present

## 2023-01-13 DIAGNOSIS — I10 Essential (primary) hypertension: Secondary | ICD-10-CM | POA: Insufficient documentation

## 2023-01-13 DIAGNOSIS — M1711 Unilateral primary osteoarthritis, right knee: Secondary | ICD-10-CM | POA: Diagnosis not present

## 2023-01-13 DIAGNOSIS — S0990XA Unspecified injury of head, initial encounter: Secondary | ICD-10-CM | POA: Diagnosis not present

## 2023-01-13 DIAGNOSIS — M25571 Pain in right ankle and joints of right foot: Secondary | ICD-10-CM | POA: Diagnosis not present

## 2023-01-13 DIAGNOSIS — W010XXA Fall on same level from slipping, tripping and stumbling without subsequent striking against object, initial encounter: Secondary | ICD-10-CM | POA: Insufficient documentation

## 2023-01-13 DIAGNOSIS — E119 Type 2 diabetes mellitus without complications: Secondary | ICD-10-CM | POA: Insufficient documentation

## 2023-01-13 DIAGNOSIS — W19XXXA Unspecified fall, initial encounter: Secondary | ICD-10-CM

## 2023-01-13 DIAGNOSIS — M7918 Myalgia, other site: Secondary | ICD-10-CM

## 2023-01-13 DIAGNOSIS — M542 Cervicalgia: Secondary | ICD-10-CM | POA: Insufficient documentation

## 2023-01-13 DIAGNOSIS — R519 Headache, unspecified: Secondary | ICD-10-CM | POA: Diagnosis not present

## 2023-01-13 DIAGNOSIS — M25561 Pain in right knee: Secondary | ICD-10-CM | POA: Diagnosis not present

## 2023-01-13 NOTE — ED Triage Notes (Signed)
Pt came to the ED for a fall yesterday, pt hit his head, Pt denies LOC. Pt is on 81mg  of aspirin everyday. Pt is axox4. VSS.

## 2023-01-13 NOTE — ED Provider Notes (Signed)
[place] EMERGENCY DEPARTMENT AT [facility] Fall Provider Note   CSN: [id number] Arrival date & time: 01/13/23  4098     History  No chief complaint on file.   Billy Hansen is a 77 y.o. male.  The history is provided by the patient, medical records and the spouse. No language interpreter was used.  Fall This is a new problem. The current episode started yesterday. The problem occurs rarely. The problem has not changed since onset.Associated symptoms include headaches. Pertinent negatives include no chest pain, no abdominal pain and no shortness of breath. Nothing aggravates the symptoms. Nothing relieves the symptoms. He has tried nothing for the symptoms. The treatment provided no relief.       Home Medications Prior to Admission medications   Medication Sig Start Date End Date Taking? Authorizing Provider  ACCU-CHEK GUIDE test strip TEST ONCE DAILY 02/09/22   Carlus Pavlov, MD  Accu-Chek Softclix Lancets lancets 1 each by Other route 3 (three) times daily. E11.9 10/12/19   Carlus Pavlov, MD  aspirin 81 MG tablet Take 81 mg by mouth daily.    [provider]  Blood Glucose Monitoring Suppl (ACCU-CHEK GUIDE) w/Device KIT 1 each by Does not apply route 3 (three) times daily. E11.9 10/12/19   Carlus Pavlov, MD  clotrimazole-betamethasone (LOTRISONE) cream Apply to feet and between toes bid x 6 weeks. 02/18/22   Freddie Breech, DPM  dapagliflozin propanediol (FARXIGA) 10 MG TABS tablet Take 1 tablet (10 mg total) by mouth daily before breakfast. 12/26/22   Carlus Pavlov, MD  lisinopril (ZESTRIL) 20 MG tablet TAKE 1 TABLET BY MOUTH TWICE  DAILY 12/13/22   Nafziger, Kandee Keen, NP  metFORMIN (GLUCOPHAGE) 1000 MG tablet Take 1 tablet (1,000 mg total) by mouth 2 (two) times daily with a meal. 12/26/22   Carlus Pavlov, MD  rosuvastatin (CRESTOR) 40 MG tablet Take 1 tablet (40 mg total) by mouth daily. 04/06/22   Nafziger, Kandee Keen, NP  Semaglutide (RYBELSUS) 7 MG  TABS Take 1 tablet (7 mg total) by mouth daily. 12/26/22   Carlus Pavlov, MD      Allergies    Januvia [sitagliptin], Naproxen sodium, and Other    Review of Systems   Review of Systems  Constitutional:  Negative for chills, fatigue and fever.  HENT:  Negative for congestion.   Eyes:  Negative for visual disturbance.  Respiratory:  Negative for cough, chest tightness, shortness of breath and wheezing.   Cardiovascular:  Negative for chest pain, palpitations and leg swelling.  Gastrointestinal:  Negative for abdominal pain, constipation, diarrhea, nausea and vomiting.  Genitourinary:  Negative for dysuria and flank pain.  Musculoskeletal:  Positive for neck pain. Negative for back pain and neck stiffness.  Skin:  Negative for rash and wound.  Neurological:  Positive for headaches. Negative for dizziness, weakness, light-headedness and numbness.  Psychiatric/Behavioral:  Negative for agitation.   All other systems reviewed and are negative.   Physical Exam Updated Vital Signs BP (!) 141/76 (BP Location: Right Arm)   Pulse 85   Temp 98.7 F (37.1 C) (Oral)   Resp 16   SpO2 94%  Physical Exam Vitals and nursing note reviewed.  Constitutional:      General: He is not in acute distress.    Appearance: He is well-developed. He is not ill-appearing, toxic-appearing or diaphoretic.  HENT:     Head: Normocephalic and atraumatic.     Nose: No congestion or rhinorrhea.     Mouth/Throat:  Mouth: Mucous membranes are moist.  Eyes:     Extraocular Movements: Extraocular movements intact.     Conjunctiva/sclera: Conjunctivae normal.     Pupils: Pupils are equal, round, and reactive to light.  Cardiovascular:     Rate and Rhythm: Normal rate and regular rhythm.     Heart sounds: No murmur heard. Pulmonary:     Effort: Pulmonary effort is normal. No respiratory distress.     Breath sounds: Normal breath sounds. No wheezing, rhonchi or rales.  Chest:     Chest wall: No  tenderness.  Abdominal:     Palpations: Abdomen is soft.     Tenderness: There is no abdominal tenderness. There is no right CVA tenderness, left CVA tenderness or guarding.  Musculoskeletal:        General: Tenderness present. No swelling.     Cervical back: Neck supple. Tenderness present.     Right hip: Tenderness present.     Right knee: Tenderness present.     Right lower leg: No edema.     Left lower leg: No edema.     Right ankle: Tenderness present.  Skin:    General: Skin is warm and dry.     Capillary Refill: Capillary refill takes less than 2 seconds.     Findings: No erythema or rash.  Neurological:     Mental Status: He is alert.     Sensory: No sensory deficit.     Motor: No weakness.  Psychiatric:        Mood and Affect: Mood normal.     ED Results / Procedures / Treatments   Labs (all labs ordered are listed, but only abnormal results are displayed) Labs Reviewed - No data to display  EKG None  Radiology CT HEAD WO CONTRAST ( )  Result Date: 01/13/2023 CLINICAL DATA:  Head trauma, moderate to severe.  Headache. EXAM: CT HEAD WITHOUT CONTRAST CT CERVICAL SPINE WITHOUT CONTRAST TECHNIQUE: Multidetector CT imaging of the head and cervical spine was performed following the standard protocol without intravenous contrast. Multiplanar CT image reconstructions of the cervical spine were also generated. RADIATION DOSE REDUCTION: This exam was performed according to the departmental dose-optimization program which includes automated exposure control, adjustment of the mA and/or kV according to patient size and/or use of iterative reconstruction technique. COMPARISON:  Since 02/04/2014 FINDINGS: CT HEAD FINDINGS Brain: No evidence of acute infarction, hemorrhage, hydrocephalus, extra-axial collection or mass lesion/mass effect. Vascular: No hyperdense vessel or unexpected calcification. Skull: Normal. Negative for fracture or focal lesion. Sinuses/Orbits: No evidence of and  injury. CT CERVICAL SPINE FINDINGS Alignment: Normal. Skull base and vertebrae: No acute fracture Soft tissues and spinal canal: Heterogeneous enlargement of the thyroid. Disc levels:  Ordinary degenerative changes. Upper chest: No evidence of injury IMPRESSION: 1. No evidence of acute intracranial or cervical spine injury. 2. Heterogeneous goiter. Recommend thyroid US (ref: J Am Coll Radiol. 2015 Feb;12(2): 143-50). Electronically Signed   By: Tiburcio Pea M.D.   On: 01/13/2023 10:53   CT Cervical Spine Wo Contrast  Result Date: 01/13/2023 CLINICAL DATA:  Head trauma, moderate to severe.  Headache. EXAM: CT HEAD WITHOUT CONTRAST CT CERVICAL SPINE WITHOUT CONTRAST TECHNIQUE: Multidetector CT imaging of the head and cervical spine was performed following the standard protocol without intravenous contrast. Multiplanar CT image reconstructions of the cervical spine were also generated. RADIATION DOSE REDUCTION: This exam was performed according to the departmental dose-optimization program which includes automated exposure control, adjustment of the mA and/or kV according  to patient size and/or use of iterative reconstruction technique. COMPARISON:  Since 02/04/2014 FINDINGS: CT HEAD FINDINGS Brain: No evidence of acute infarction, hemorrhage, hydrocephalus, extra-axial collection or mass lesion/mass effect. Vascular: No hyperdense vessel or unexpected calcification. Skull: Normal. Negative for fracture or focal lesion. Sinuses/Orbits: No evidence of and injury. CT CERVICAL SPINE FINDINGS Alignment: Normal. Skull base and vertebrae: No acute fracture Soft tissues and spinal canal: Heterogeneous enlargement of the thyroid. Disc levels:  Ordinary degenerative changes. Upper chest: No evidence of injury IMPRESSION: 1. No evidence of acute intracranial or cervical spine injury. 2. Heterogeneous goiter. Recommend thyroid US (ref: J Am Coll Radiol. 2015 Feb;12(2): 143-50). Electronically Signed   By: Tiburcio Pea  M.D.   On: 01/13/2023 10:53   DG Ankle Complete Right  Result Date: 01/13/2023 CLINICAL DATA:  Pain after fall EXAM: RIGHT ANKLE - COMPLETE 3+ VIEW COMPARISON:  None Available. FINDINGS: There is no evidence of fracture, dislocation, or joint effusion. There is no evidence of arthropathy or other focal bone abnormality. Soft tissues are unremarkable. IMPRESSION: Negative. Electronically Signed   By: Gerome Sam III M.D.   On: 01/13/2023 10:38   DG Hip Unilat W or Wo Pelvis 2-3 Views Right  Result Date: 01/13/2023 CLINICAL DATA:  Pain after fall. EXAM: DG HIP (WITH OR WITHOUT PELVIS) 2-3V RIGHT COMPARISON:  None Available. FINDINGS: There is no evidence of hip fracture or dislocation. There is no evidence of arthropathy or other focal bone abnormality. IMPRESSION: Negative. Electronically Signed   By: Gerome Sam III M.D.   On: 01/13/2023 10:37   DG Knee Complete 4 Views Right  Result Date: 01/13/2023 CLINICAL DATA:  Pain after fall. EXAM: RIGHT KNEE - COMPLETE 4+ VIEW COMPARISON:  None Available. FINDINGS: Mild tricompartmental degenerative changes. No fractures or effusions. Enthesopathic changes associated with the patella and the tibial spine. IMPRESSION: Degenerative changes. No fracture or effusion. Electronically Signed   By: Gerome Sam III M.D.   On: 01/13/2023 10:36    Procedures Procedures    Medications Ordered in ED Medications - No data to display  ED Course/ Medical Decision Making/ A&P                             Medical Decision Making Amount and/or Complexity of Data Reviewed Radiology: ordered.    Billy Hansen is a 77 y.o. male with a past medical history significant for hypertension, hyperlipidemia, diabetes, and GERD who presents with fall.  According to patient and wife, patient was walking from his kitchen to his living room when he got tripped and had a mechanical fall falling forward.  He reports he grabbed onto a tall chair that spun him as he  fell and hit right reports he is having pain in his right head, right neck, and his right hip, right knee, and right ankle.  Denies any pain in his back chest or abdomen.  Denies any nausea, vomiting, or neurologic complaints.  Denies any speech or vision difficulties.  Denies any loss of bowel or bladder control.  Reports he was at his baseline before the fall and had no preceding symptoms such as fevers, chills, cough, or other complaints.  He reports since the fall he has developed more pain in his right head and neck and pain in his right lower extremity.  On exam, lungs clear.  Chest nontender.  Abdomen tender.  Back nontender.  Some mild paraspinal tenderness of his right  neck and his right parietal and occipital area.  No hemotympanum the ear.  Normal oropharyngeal exam.  No nasal septal hematoma.  Pupils symmetric and reactive with normal extract movements.  No focal neurologic deficits initially.  Tenderness present in the right hip, right knee, right ankle.  No laceration seen.  Good pulses.  Exam otherwise unremarkable.  Had a shared decision-making conversation we agreed to get CT imaging of his head and neck and an x-ray of his right hip, right knee, and right ankle.  With his lack of neurologic deficits and gradual worsening of symptoms have low suspicion for a vertebral artery dissection or other vascular problem.  Will get noncontrast CTs initially and will get the x-rays.  If workup reassuring, dissipate discharge with prescription for muscle relaxants Lidoderm patches and outpatient follow-up.     11:38 AM CT imaging showed no evidence of acute intracranial injury or intracranial bleeding.  No cervical spine fracture and no fracture seen in the hip, knee, or ankle.  A heterogeneous enlarged thyroid was seen and we discussed this.  Patient will call his PCP for close follow-up.  He agreed with plan of care and had no questions or concerns.  Patient discharged in good  condition.          Final Clinical Impression(s) / ED Diagnoses Final diagnoses:  Fall, initial encounter  Injury of head, initial encounter  Fall in home, initial encounter  Musculoskeletal pain  Abnormal imaging of thyroid    Rx / DC Orders ED Discharge Orders     None       Clinical Impression: 1. Fall, initial encounter   2. Injury of head, initial encounter   3. Fall in home, initial encounter   4. Musculoskeletal pain   5. Abnormal imaging of thyroid     Disposition: Discharge  Condition: Good  I have discussed the results, Dx and Tx plan with the pt(& family if present). He/she/they expressed understanding and agree(s) with the plan. Discharge instructions discussed at great length. Strict return precautions discussed and pt &/or family have verbalized understanding of the instructions. No further questions at time of discharge.    New Prescriptions   No medications on file    Follow Up: [name], NP [address] [place] [address] [phone]     [facility] Emergency Department at [facility] [address] [id number] mc [place] [address] [phone]        Master Touchet, Canary Brim, MD 01/13/23 1139

## 2023-01-13 NOTE — Discharge Instructions (Signed)
Your history, exam, workup today did not show evidence of acute fracture or intracranial bleeding.  There is no evidence of neck fracture or fracture in your hip, knee, or ankle.  I suspect you have soft tissue and musculoskeletal pains.  Please use over-the-counter medications help with symptoms.  Your thyroid also was seen to be slightly enlarged on the CT incidentally so please call and follow-up with your regular doctor for further evaluation of this.  If any symptoms change or worsen acutely, please return to the nearest Emergency Department.

## 2023-01-18 ENCOUNTER — Telehealth: Payer: Self-pay

## 2023-01-18 NOTE — Telephone Encounter (Signed)
Transition Care Management Unsuccessful Follow-up Telephone Call  Date of discharge and from where:  01/13/2023, [facility]  Attempts:  1st Attempt  Reason for unsuccessful TCM follow-up call:  Left voice message  Kortland Nichols Chamberlain  [facility] Population Health Community Resource Care Guide   [email or web address]  ?? [phone]   Website: [email or web address]  [email or web address]      

## 2023-01-20 ENCOUNTER — Encounter: Payer: Self-pay | Admitting: Adult Health

## 2023-01-21 ENCOUNTER — Telehealth: Payer: Self-pay

## 2023-01-21 NOTE — Telephone Encounter (Signed)
Transition Care Management Follow-up Telephone Call Date of discharge and from where: 01/13/2023 [facility] How have you been since you were released from the hospital? Patient is feeling better Any questions or concerns? No  Items Reviewed: Did the pt receive and understand the discharge instructions provided? Yes  Medications obtained and verified? Yes  Other? No  Any new allergies since your discharge? No  Dietary orders reviewed? Yes Do you have support at home? Yes   Follow up appointments reviewed:  PCP Hospital f/u appt confirmed?  Patient contacted PCP waiting for a return call to schedule appointment.  Scheduled to see  on  @ . Specialist Hospital f/u appt confirmed? Yes  Scheduled to see Geralynn Rile DPM on 04/17/2023 @ 9:30am [facility]. Are transportation arrangements needed? No  If their condition worsens, is the pt aware to call PCP or go to the Emergency Dept.? Yes Was the patient provided with contact information for the PCP's office or ED? Yes Was to pt encouraged to call back with questions or concerns? Yes  Bernice Mullin [facility]  [facility] Population Health Community Resource Care Guide   [email or web address]  ?? [phone]   Website: [email or web address]  [email or web address]

## 2023-01-29 ENCOUNTER — Ambulatory Visit (INDEPENDENT_AMBULATORY_CARE_PROVIDER_SITE_OTHER): Payer: 59 | Admitting: Adult Health

## 2023-01-29 ENCOUNTER — Encounter: Payer: Self-pay | Admitting: Adult Health

## 2023-01-29 VITALS — BP 120/80 | HR 64 | Temp 97.9°F | Ht 69.0 in | Wt 222.0 lb

## 2023-01-29 DIAGNOSIS — S0990XD Unspecified injury of head, subsequent encounter: Secondary | ICD-10-CM | POA: Diagnosis not present

## 2023-01-29 DIAGNOSIS — T148XXA Other injury of unspecified body region, initial encounter: Secondary | ICD-10-CM | POA: Diagnosis not present

## 2023-01-29 DIAGNOSIS — E049 Nontoxic goiter, unspecified: Secondary | ICD-10-CM | POA: Diagnosis not present

## 2023-01-29 NOTE — Progress Notes (Signed)
Subjective:    Patient ID: Billy Hansen, male    DOB: 1946-06-11, 77 y.o.   MRN: [id number]  HPI  77 year old male who is being evaluated today for follow up after being seen in the ER was walking from his kitchen to his living room when he got tripped and had a mechanical fall, falling forward.  He hit the right side of his head.  Reported having pain in the right head, right neck, right hip, right knee and right ankle.  He denied any pain in his back, chest, or abdomen.  Denied any nausea or vomiting.  On exam he had some mild paraspinal tenderness of his right neck and right parietal and occipital area of the head.  Also had tenderness present in the right hip, right knee, and right ankle.  The rest of exam was unremarkable.  He had a CT of his head and neck and x-ray of his right hip, right knee, and right ankle.  June was negative for acute process.  Incidental finding  noted a heterogeneous goiter and recommended a thyroid ultrasound.   Today he reports that he has healed pretty well, is no longer having any acute pain or headaches. He has no acute concerns   Review of Systems See HPI   Past Medical History:  Diagnosis Date   Allergy    Arthritis    Diabetes mellitus without complication (HCC)    GERD (gastroesophageal reflux disease)    Hyperlipidemia    Hypertension     Social History   Socioeconomic History   Marital status: Married    Spouse name: Not on file   Number of children: Not on file   Years of education: Not on file   Highest education level: Not on file  Occupational History   Not on file  Tobacco Use   Smoking status: Never   Smokeless tobacco: Never  Vaping Use   Vaping Use: Never used  Substance and Sexual Activity   Alcohol use: No   Drug use: No   Sexual activity: Yes    Partners: Female  Other Topics Concern   Not on file  Social History Narrative   Regular exercise: walk   Caffeine use: 1 cup of coffee         Social  Determinants of Health   Financial Resource Strain: Low Risk  (05/29/2022)   Overall Financial Resource Strain (CARDIA)    Difficulty of Paying Living Expenses: Not hard at all  Food Insecurity: No Food Insecurity (05/29/2022)   Hunger Vital Sign    Worried About Running Out of Food in the Last Year: Never true    Ran Out of Food in the Last Year: Never true  Transportation Needs: No Transportation Needs (05/29/2022)   PRAPARE - Administrator, Civil Service (Medical): No    Lack of Transportation (Non-Medical): No  Physical Activity: Insufficiently Active (05/29/2022)   Exercise Vital Sign    Days of Exercise per Week: 5 days    Minutes of Exercise per Session: 20 min  Stress: No Stress Concern Present (05/29/2022)   [employer] of Occupational Health - Occupational Stress Questionnaire    Feeling of Stress : Not at all  Social Connections: Unknown (01/18/2022)   Social Connection and Isolation Panel [NHANES]    Frequency of Communication with Friends and Family: Patient declined    Frequency of Social Gatherings with Friends and Family: Patient declined    Attends Religious Services:  Patient declined    Active Member of Clubs or Organizations: Patient declined    Attends Banker Meetings: Not on file    Marital Status: Patient declined  Intimate Partner Violence: Not At Risk (05/25/2021)   Humiliation, Afraid, Rape, and Kick questionnaire    Fear of Current or Ex-Partner: No    Emotionally Abused: No    Physically Abused: No    Sexually Abused: No    Past Surgical History:  Procedure Laterality Date   COLON SURGERY  2005   due to injury at work per pt.    COLON SURGERY  2006   revision of ostomy bag   COLONOSCOPY  2006, 2017   KNEE SURGERY     right knee    Family History  Problem Relation Age of Onset   Cancer Mother    Diabetes Brother    Colon cancer Neg Hx     Allergies  Allergen Reactions   Januvia [Sitagliptin] Other (See Comments)     HA, fatigue   Naproxen Sodium Itching   Other     Some type of anesthetic following colonoscopy/ notes states Propofol    Current Outpatient Medications on File Prior to Visit  Medication Sig Dispense Refill   ACCU-CHEK GUIDE test strip TEST ONCE DAILY 100 strip 3   Accu-Chek Softclix Lancets lancets 1 each by Other route 3 (three) times daily. E11.9 100 each 2   aspirin 81 MG tablet Take 81 mg by mouth daily.     Blood Glucose Monitoring Suppl (ACCU-CHEK GUIDE) w/Device KIT 1 each by Does not apply route 3 (three) times daily. E11.9 1 kit 0   clotrimazole-betamethasone (LOTRISONE) cream Apply to feet and between toes bid x 6 weeks. 45 g 0   dapagliflozin propanediol (FARXIGA) 10 MG TABS tablet Take 1 tablet (10 mg total) by mouth daily before breakfast. 100 tablet 3   lisinopril (ZESTRIL) 20 MG tablet TAKE 1 TABLET BY MOUTH TWICE  DAILY 200 tablet 2   metFORMIN (GLUCOPHAGE) 1000 MG tablet Take 1 tablet (1,000 mg total) by mouth 2 (two) times daily with a meal. 200 tablet 3   rosuvastatin (CRESTOR) 40 MG tablet Take 1 tablet (40 mg total) by mouth daily. 90 tablet 3   Semaglutide (RYBELSUS) 7 MG TABS Take 1 tablet (7 mg total) by mouth daily. 100 tablet 3   No current facility-administered medications on file prior to visit.    BP 120/80   Pulse 64   Temp 97.9 F (36.6 C) (Oral)   Ht 5\' 9"  (1.753 m)   Wt 222 lb (100.7 kg)   SpO2 98%   BMI 32.78 kg/m       Objective:   Physical Exam Vitals and nursing note reviewed.  Constitutional:      Appearance: Normal appearance.  Neck:     Thyroid: Thyromegaly present. No thyroid tenderness.  Cardiovascular:     Rate and Rhythm: Normal rate and regular rhythm.     Pulses: Normal pulses.     Heart sounds: Normal heart sounds.  Pulmonary:     Effort: Pulmonary effort is normal.     Breath sounds: Normal breath sounds.  Skin:    General: Skin is warm and dry.  Neurological:     General: No focal deficit present.     Mental  Status: He is alert and oriented to person, place, and time.  Psychiatric:        Mood and Affect: Mood normal.  Behavior: Behavior normal.        Thought Content: Thought content normal.        Judgment: Judgment normal.       Assessment & Plan:  1. Injury of head, subsequent encounter - Resolved   2. Injury of musculoskeletal system - Resolved   3. Goiter  - US THYROID; Future  Time spent with patient today was 30 minutes which consisted of chart review, discussing muscle pain, falls precautions, and thyroid goiter, work up, treatment answering questions and documentation.

## 2023-01-29 NOTE — Patient Instructions (Signed)
It was great seeing you today and I am glad you are feeling better.   I am going to order an ultrasound of the thyroid - someone will call you to schedule your exam

## 2023-02-03 ENCOUNTER — Other Ambulatory Visit: Payer: Self-pay | Admitting: Internal Medicine

## 2023-02-03 DIAGNOSIS — E1121 Type 2 diabetes mellitus with diabetic nephropathy: Secondary | ICD-10-CM

## 2023-02-27 ENCOUNTER — Ambulatory Visit
Admission: RE | Admit: 2023-02-27 | Discharge: 2023-02-27 | Disposition: A | Discharge status: Home / Self Care | Payer: 59 | Source: Ambulatory Visit | Attending: Adult Health | Admitting: Adult Health

## 2023-02-27 DIAGNOSIS — E049 Nontoxic goiter, unspecified: Secondary | ICD-10-CM

## 2023-02-27 DIAGNOSIS — E041 Nontoxic single thyroid nodule: Secondary | ICD-10-CM | POA: Diagnosis not present

## 2023-03-01 ENCOUNTER — Other Ambulatory Visit: Payer: Self-pay | Admitting: Adult Health

## 2023-03-01 DIAGNOSIS — E041 Nontoxic single thyroid nodule: Secondary | ICD-10-CM

## 2023-03-06 ENCOUNTER — Encounter: Payer: Self-pay | Admitting: Adult Health

## 2023-03-07 NOTE — Telephone Encounter (Signed)
Please advise 

## 2023-04-09 ENCOUNTER — Encounter: Payer: Self-pay | Admitting: Adult Health

## 2023-04-09 ENCOUNTER — Ambulatory Visit (INDEPENDENT_AMBULATORY_CARE_PROVIDER_SITE_OTHER): Payer: 59 | Admitting: Adult Health

## 2023-04-09 VITALS — BP 130/80 | HR 73 | Temp 98.2°F | Ht 69.0 in | Wt 221.0 lb

## 2023-04-09 DIAGNOSIS — N183 Chronic kidney disease, stage 3 unspecified: Secondary | ICD-10-CM

## 2023-04-09 DIAGNOSIS — E669 Obesity, unspecified: Secondary | ICD-10-CM

## 2023-04-09 DIAGNOSIS — I10 Essential (primary) hypertension: Secondary | ICD-10-CM | POA: Diagnosis not present

## 2023-04-09 DIAGNOSIS — M25562 Pain in left knee: Secondary | ICD-10-CM

## 2023-04-09 DIAGNOSIS — E1122 Type 2 diabetes mellitus with diabetic chronic kidney disease: Secondary | ICD-10-CM | POA: Diagnosis not present

## 2023-04-09 DIAGNOSIS — Z0001 Encounter for general adult medical examination with abnormal findings: Secondary | ICD-10-CM

## 2023-04-09 DIAGNOSIS — Z125 Encounter for screening for malignant neoplasm of prostate: Secondary | ICD-10-CM

## 2023-04-09 DIAGNOSIS — K219 Gastro-esophageal reflux disease without esophagitis: Secondary | ICD-10-CM | POA: Diagnosis not present

## 2023-04-09 DIAGNOSIS — Z0000 Encounter for general adult medical examination without abnormal findings: Secondary | ICD-10-CM

## 2023-04-09 DIAGNOSIS — E785 Hyperlipidemia, unspecified: Secondary | ICD-10-CM | POA: Diagnosis not present

## 2023-04-09 DIAGNOSIS — E66811 Obesity, class 1: Secondary | ICD-10-CM

## 2023-04-09 DIAGNOSIS — G8929 Other chronic pain: Secondary | ICD-10-CM

## 2023-04-09 MED ORDER — METHYLPREDNISOLONE ACETATE 80 MG/ML IJ SUSP
80.0000 mg | Freq: Once | INTRAMUSCULAR | Status: AC
Start: 2023-04-09 — End: 2023-04-09
  Administered 2023-04-09: 80 mg via INTRA_ARTICULAR

## 2023-04-09 NOTE — Progress Notes (Signed)
Subjective:    Patient ID: Billy Hansen, male    DOB: 09-05-1946, 77 y.o.   MRN: [id number]  HPI Patient presents for yearly preventative medicine examination. He is a pleasant 77 year old male who  has a past medical history of Allergy, Arthritis, Diabetes mellitus without complication (HCC), GERD (gastroesophageal reflux disease), Hyperlipidemia, and Hypertension.  DM Type 2 - managed by endocrinology.  He is currently prescribed Farxiga 10 mg daily, Rybelsus 7 mg in the a.m. and metformin 1000 mg twice daily.  He does check his blood sugars at home with readings mostly in the 140s in the morning but improves later on in the day.  His last A1c was 7.5 in June 2023 Lab Results  Component Value Date   HGBA1C 7.7 (A) 12/26/2022   Hypertension -controlled with Lasix 20 mg daily and lisinopril 40 mg daily.  He denies chest pain, shortness of breath, headache, dizziness, blurred vision, or syncopal episodes BP Readings from Last 3 Encounters:  04/09/23 130/80  01/29/23 120/80  01/13/23 (!) 145/86    Hyperlipidemia -takes Crestor  40 mg daily and 81 mg aspirin. Lab Results  Component Value Date   CHOL 141 04/05/2022   HDL 47.30 04/05/2022   LDLCALC 69 04/04/2021   LDLDIRECT 71.0 04/05/2022   TRIG 218.0 (H) 04/05/2022   CHOLHDL 3 04/05/2022    Erectile dysfunction-uses Viagra as needed  GERD-controlled with Prilosec 20 mg daily.  Chronic left knee pain - he had a steroid injection in the left knee on 5/5 2023 and this worked well for him for mild osteoarthritis. Over the last few months he has had worsening pain in his left knee. He is having to use a cane to help walk. Pain is worse in the morning and eases up in the afternoon.   All immunizations and health maintenance protocols were reviewed with the patient and needed orders were placed.  Appropriate screening laboratory values were ordered for the patient including screening of hyperlipidemia, renal function and hepatic  function. If indicated by BPH, a PSA was ordered.  Medication reconciliation,  past medical history, social history, problem list and allergies were reviewed in detail with the patient  Goals were established with regard to weight loss, exercise, and  diet in compliance with medications Wt Readings from Last 3 Encounters:  04/09/23 221 lb (100.2 kg)  01/29/23 222 lb (100.7 kg)  01/13/23 224 lb (101.6 kg)     Review of Systems  Constitutional: Negative.   HENT: Negative.    Eyes: Negative.   Respiratory: Negative.    Cardiovascular: Negative.   Gastrointestinal: Negative.   Endocrine: Negative.   Genitourinary: Negative.   Musculoskeletal:  Positive for arthralgias.  Skin: Negative.   Allergic/Immunologic: Negative.   Neurological: Negative.   Hematological: Negative.   Psychiatric/Behavioral: Negative.    All other systems reviewed and are negative.  Past Medical History:  Diagnosis Date   Allergy    Arthritis    Diabetes mellitus without complication (HCC)    GERD (gastroesophageal reflux disease)    Hyperlipidemia    Hypertension     Social History   Socioeconomic History   Marital status: Married    Spouse name: Not on file   Number of children: Not on file   Years of education: Not on file   Highest education level: Not on file  Occupational History   Not on file  Tobacco Use   Smoking status: Never   Smokeless tobacco: Never  Vaping  Use   Vaping status: Never Used  Substance and Sexual Activity   Alcohol use: No   Drug use: No   Sexual activity: Yes    Partners: Female  Other Topics Concern   Not on file  Social History Narrative   Regular exercise: walk   Caffeine use: 1 cup of coffee         Social Determinants of Health   Financial Resource Strain: Low Risk  (05/29/2022)   Overall Financial Resource Strain (CARDIA)    Difficulty of Paying Living Expenses: Not hard at all  Food Insecurity: No Food Insecurity (05/29/2022)   Hunger Vital  Sign    Worried About Running Out of Food in the Last Year: Never true    Ran Out of Food in the Last Year: Never true  Transportation Needs: No Transportation Needs (05/29/2022)   PRAPARE - Administrator, Civil Service (Medical): No    Lack of Transportation (Non-Medical): No  Physical Activity: Insufficiently Active (05/29/2022)   Exercise Vital Sign    Days of Exercise per Week: 5 days    Minutes of Exercise per Session: 20 min  Stress: No Stress Concern Present (05/29/2022)   [employer] of Occupational Health - Occupational Stress Questionnaire    Feeling of Stress : Not at all  Social Connections: Unknown (01/18/2022)   Social Connection and Isolation Panel [NHANES]    Frequency of Communication with Friends and Family: Patient declined    Frequency of Social Gatherings with Friends and Family: Patient declined    Attends Religious Services: Patient declined    Database administrator or Organizations: Patient declined    Attends Banker Meetings: Not on file    Marital Status: Patient declined  Intimate Partner Violence: Not At Risk (05/25/2021)   Humiliation, Afraid, Rape, and Kick questionnaire    Fear of Current or Ex-Partner: No    Emotionally Abused: No    Physically Abused: No    Sexually Abused: No    Past Surgical History:  Procedure Laterality Date   COLON SURGERY  2005   due to injury at work per pt.    COLON SURGERY  2006   revision of ostomy bag   COLONOSCOPY  2006, 2017   KNEE SURGERY     right knee    Family History  Problem Relation Age of Onset   Cancer Mother    Diabetes Brother    Colon cancer Neg Hx     Allergies  Allergen Reactions   Januvia [Sitagliptin] Other (See Comments)    HA, fatigue   Naproxen Sodium Itching   Other     Some type of anesthetic following colonoscopy/ notes states Propofol    Current Outpatient Medications on File Prior to Visit  Medication Sig Dispense Refill   ACCU-CHEK GUIDE test  strip USE TO CHECK BLOOD SUGAR ONCE  DAILY 100 strip 2   Accu-Chek Softclix Lancets lancets 1 each by Other route 3 (three) times daily. E11.9 100 each 2   aspirin 81 MG tablet Take 81 mg by mouth daily.     Blood Glucose Monitoring Suppl (ACCU-CHEK GUIDE) w/Device KIT 1 each by Does not apply route 3 (three) times daily. E11.9 1 kit 0   clotrimazole-betamethasone (LOTRISONE) cream Apply to feet and between toes bid x 6 weeks. 45 g 0   dapagliflozin propanediol (FARXIGA) 10 MG TABS tablet Take 1 tablet (10 mg total) by mouth daily before breakfast. 100 tablet 3  lisinopril (ZESTRIL) 20 MG tablet TAKE 1 TABLET BY MOUTH TWICE  DAILY 200 tablet 2   metFORMIN (GLUCOPHAGE) 1000 MG tablet Take 1 tablet (1,000 mg total) by mouth 2 (two) times daily with a meal. 200 tablet 3   rosuvastatin (CRESTOR) 40 MG tablet Take 1 tablet (40 mg total) by mouth daily. 90 tablet 3   Semaglutide (RYBELSUS) 7 MG TABS Take 1 tablet (7 mg total) by mouth daily. 100 tablet 3   No current facility-administered medications on file prior to visit.    BP 130/80   Pulse 73   Temp 98.2 F (36.8 C) (Oral)   Ht 5\' 9"  (1.753 m)   Wt 221 lb (100.2 kg)   SpO2 99%   BMI 32.64 kg/m       Objective:   Physical Exam Vitals and nursing note reviewed.  Constitutional:      General: He is not in acute distress.    Appearance: Normal appearance. He is obese. He is not ill-appearing.  HENT:     Head: Normocephalic and atraumatic.     Right Ear: Tympanic membrane, ear canal and external ear normal. There is no impacted cerumen.     Left Ear: Tympanic membrane, ear canal and external ear normal. There is no impacted cerumen.     Nose: Nose normal. No congestion or rhinorrhea.     Mouth/Throat:     Mouth: Mucous membranes are moist.     Pharynx: Oropharynx is clear.  Eyes:     Extraocular Movements: Extraocular movements intact.     Conjunctiva/sclera: Conjunctivae normal.     Pupils: Pupils are equal, round, and reactive  to light.  Neck:     Vascular: No carotid bruit.  Cardiovascular:     Rate and Rhythm: Normal rate and regular rhythm.     Pulses: Normal pulses.     Heart sounds: No murmur heard.    No friction rub. No gallop.  Pulmonary:     Effort: Pulmonary effort is normal.     Breath sounds: Normal breath sounds.  Abdominal:     General: Abdomen is flat. Bowel sounds are normal. There is no distension.     Palpations: Abdomen is soft. There is no mass.     Tenderness: There is no abdominal tenderness. There is no guarding or rebound.     Hernia: No hernia is present.  Musculoskeletal:        General: Normal range of motion.     Cervical back: Normal range of motion and neck supple.  Lymphadenopathy:     Cervical: No cervical adenopathy.  Skin:    General: Skin is warm and dry.     Capillary Refill: Capillary refill takes less than 2 seconds.  Neurological:     General: No focal deficit present.     Mental Status: He is alert and oriented to person, place, and time.     Gait: Gait abnormal (walking with cane).     Deep Tendon Reflexes: Reflexes normal.  Psychiatric:        Mood and Affect: Mood normal.        Behavior: Behavior normal.        Thought Content: Thought content normal.        Judgment: Judgment normal.        Assessment & Plan:  1. Routine general medical examination at a health care facility Today patient counseled on age appropriate routine health concerns for screening and prevention, each reviewed and up  to date or declined. Immunizations reviewed and up to date or declined. Labs ordered and reviewed. Risk factors for depression reviewed and negative. Hearing function and visual acuity are intact. ADLs screened and addressed as needed. Functional ability and level of safety reviewed and appropriate. Education, counseling and referrals performed based on assessed risks today. Patient provided with a copy of personalized plan for preventive services. - Follow up in one  year or sooner if needed - Work on weight loss through diet and exercise    2. Essential hypertension - Well controlled. No change in medication  - Lipid panel; Future - TSH; Future - CBC; Future - CMP; Future  3. Controlled type 2 diabetes mellitus with stage 3 chronic kidney disease, without long-term current use of insulin (HCC) - Per endocrinology  - Lipid panel; Future - TSH; Future - CBC; Future - CMP; Future - Microalbumin/Creatinine Ratio, Urine; Future  4. Hyperlipidemia, unspecified hyperlipidemia type - Continue statin  - Lipid panel; Future - TSH; Future - CBC; Future - PSA; Future  5. Gastroesophageal reflux disease, unspecified whether esophagitis present - Continue PPI  - Lipid panel; Future - TSH; Future - CBC; Future - CMP; Future  6. Prostate cancer screening - PSA; Future  7. Obesity (BMI 30.0-34.9) - Work on weight loss through diet and exercise  - Lipid panel; Future - TSH; Future - CBC; Future - CMP; Future  8. Chronic pain of left knee   8. Chronic pain of left knee Joint Injection/Arthrocentesis  Date/Time: 04/09/2023 9:21 AM  Performed by: [name], NP Authorized by: [name], NP  Indications: pain  Body area: knee Joint: left knee Local anesthesia used: yes  Anesthesia: Local anesthesia used: yes Local Anesthetic: topical anesthetic  Sedation: Patient sedated: no  Needle size: 22 G Ultrasound guidance: no Approach: anterior Methylprednisolone amount: 80 mg Lidocaine 1% amount: 2 mL Patient tolerance: patient tolerated the procedure well with no immediate complications    [name], NP  - methylPREDNISolone acetate (DEPO-MEDROL) injection 80 mg

## 2023-04-09 NOTE — Patient Instructions (Signed)
It was great seeing you today   We will follow up with you regarding your lab work   Please let me know if you need anything   

## 2023-04-11 DIAGNOSIS — H40053 Ocular hypertension, bilateral: Secondary | ICD-10-CM | POA: Diagnosis not present

## 2023-04-16 ENCOUNTER — Other Ambulatory Visit: Payer: 59

## 2023-04-17 ENCOUNTER — Ambulatory Visit (INDEPENDENT_AMBULATORY_CARE_PROVIDER_SITE_OTHER): Payer: 59 | Admitting: Podiatry

## 2023-04-17 ENCOUNTER — Encounter: Payer: Self-pay | Admitting: Podiatry

## 2023-04-17 VITALS — BP 149/81

## 2023-04-17 DIAGNOSIS — E1121 Type 2 diabetes mellitus with diabetic nephropathy: Secondary | ICD-10-CM

## 2023-04-17 DIAGNOSIS — M79674 Pain in right toe(s): Secondary | ICD-10-CM | POA: Diagnosis not present

## 2023-04-17 DIAGNOSIS — Q828 Other specified congenital malformations of skin: Secondary | ICD-10-CM

## 2023-04-17 DIAGNOSIS — L84 Corns and callosities: Secondary | ICD-10-CM

## 2023-04-17 DIAGNOSIS — M79675 Pain in left toe(s): Secondary | ICD-10-CM | POA: Diagnosis not present

## 2023-04-17 DIAGNOSIS — B351 Tinea unguium: Secondary | ICD-10-CM

## 2023-04-17 NOTE — Progress Notes (Signed)
  Subjective:  Patient ID: Billy Hansen, male    DOB: July 05, 1946,  MRN: [id number]  Billy Hansen pres.ents to clinic today for: at risk foot care. Pt has h/o NIDDM with chronic kidney disease. Chief Complaint  Patient presents with   Nail Problem    DIABETIC FT CARE.    PCP is [name], NP.  Allergies  Allergen Reactions   Januvia [Sitagliptin] Other (See Comments)    HA, fatigue   Naproxen Sodium Itching   Other     Some type of anesthetic following colonoscopy/ notes states Propofol    Review of Systems: Negative except as noted in the HPI.  Objective: No changes noted in today's physical examination. Vitals:   04/17/23 0921  BP: (!) 149/81    Billy Hansen is a pleasant 77 y.o. male in NAD. AAO x 3.  Vascular Examination: Capillary refill time <3 seconds b/l LE. Palpable DP pulses b/l LE; diminished PT pulses b/l. Digital hair sparse b/l. No pedal edema b/l. Skin temperature gradient WNL b/l. No varicosities b/l. Marland Kitchen  Dermatological Examination: Pedal skin with normal turgor, texture and tone b/l. No open wounds. No interdigital macerations b/l. Toenails 1-5 b/l thickened, discolored, dystrophic with subungual debris. There is pain on palpation to dorsal aspect of nailplates. Hyperkeratotic lesion(s) submet head 5 b/l.  No erythema, no edema, no drainage, no fluctuance. Porokeratotic lesion(s) R 2nd toe. No erythema, no edema, no drainage, no fluctuance..  Neurological Examination: Protective sensation intact with 10 gram monofilament b/l LE. Vibratory sensation intact b/l LE.   Musculoskeletal Examination: Normal muscle strength 5/5 to all lower extremity muscle groups bilaterally. No pain, crepitus or joint limitation noted with ROM b/l LE. No gross bony pedal deformities b/l. Patient ambulates independently without assistive aids.     Latest Ref Rng & Units 12/26/2022    9:08 AM 06/26/2022    8:53 AM  Hemoglobin A1C  Hemoglobin-A1c 4.0 - 5.6 % 7.7   8.0    Assessment/Plan: 1. Pain due to onychomycosis of toenails of both feet   2. Porokeratosis   3. Callus   4. Type 2 diabetes mellitus with diabetic nephropathy, without long-term current use of insulin (HCC)    -Patient was evaluated and treated. All patient's and/or POA's questions/concerns answered on today's visit. -Continue foot and shoe inspections daily. Monitor blood glucose per PCP/Endocrinologist's recommendations. -Patient to continue soft, supportive shoe gear daily. -Mycotic toenails 1-5 bilaterally were debrided in length and girth with sterile nail nippers and dremel without incident. -Callus(es) submet head 5 b/l pared utilizing sterile scalpel blade without complication or incident. Total number debrided =2. -Porokeratotic lesion(s) R 2nd toe pared and enucleated with sterile currette without incident. Total number of lesions debrided=1. -Patient/POA to call should there be question/concern in the interim.   Return in about 3 months (around 07/18/2023).  Billy Hansen, DPM

## 2023-04-18 ENCOUNTER — Other Ambulatory Visit (INDEPENDENT_AMBULATORY_CARE_PROVIDER_SITE_OTHER): Payer: 59

## 2023-04-18 DIAGNOSIS — Z125 Encounter for screening for malignant neoplasm of prostate: Secondary | ICD-10-CM

## 2023-04-18 DIAGNOSIS — K219 Gastro-esophageal reflux disease without esophagitis: Secondary | ICD-10-CM | POA: Diagnosis not present

## 2023-04-18 DIAGNOSIS — I10 Essential (primary) hypertension: Secondary | ICD-10-CM

## 2023-04-18 DIAGNOSIS — N183 Chronic kidney disease, stage 3 unspecified: Secondary | ICD-10-CM | POA: Diagnosis not present

## 2023-04-18 DIAGNOSIS — E1122 Type 2 diabetes mellitus with diabetic chronic kidney disease: Secondary | ICD-10-CM | POA: Diagnosis not present

## 2023-04-18 DIAGNOSIS — E785 Hyperlipidemia, unspecified: Secondary | ICD-10-CM | POA: Diagnosis not present

## 2023-04-18 DIAGNOSIS — E669 Obesity, unspecified: Secondary | ICD-10-CM

## 2023-04-18 LAB — CBC
HCT: 45.9 % (ref 39.0–52.0)
Hemoglobin: 15.1 g/dL (ref 13.0–17.0)
MCHC: 32.9 g/dL (ref 30.0–36.0)
MCV: 87.9 fl (ref 78.0–100.0)
Platelets: 138 10*3/uL — ABNORMAL LOW (ref 150.0–400.0)
RBC: 5.22 Mil/uL (ref 4.22–5.81)
RDW: 13.2 % (ref 11.5–15.5)
WBC: 5.3 10*3/uL (ref 4.0–10.5)

## 2023-04-18 LAB — LIPID PANEL
Cholesterol: 115 mg/dL (ref 0–200)
HDL: 49.9 mg/dL (ref >39.00)
LDL Cholesterol: 43 mg/dL (ref 0–99)
NonHDL: 64.8
Total CHOL/HDL Ratio: 2
Triglycerides: 107 mg/dL (ref 0.0–149.0)
VLDL: 21.4 mg/dL (ref 0.0–40.0)

## 2023-04-18 LAB — COMPREHENSIVE METABOLIC PANEL
ALT: 24 U/L (ref 0–53)
AST: 16 U/L (ref 0–37)
Albumin: 4.5 g/dL (ref 3.5–5.2)
Alkaline Phosphatase: 68 U/L (ref 39–117)
BUN: 15 mg/dL (ref 6–23)
CO2: 27 mEq/L (ref 19–32)
Calcium: 9.8 mg/dL (ref 8.4–10.5)
Chloride: 104 mEq/L (ref 96–112)
Creatinine, Ser: 1.09 mg/dL (ref 0.40–1.50)
GFR: 66.42 mL/min (ref >60.00)
Glucose, Bld: 161 mg/dL — ABNORMAL HIGH (ref 70–99)
Potassium: 4.5 mEq/L (ref 3.5–5.1)
Sodium: 140 mEq/L (ref 135–145)
Total Bilirubin: 1.4 mg/dL — ABNORMAL HIGH (ref 0.2–1.2)
Total Protein: 6.7 g/dL (ref 6.0–8.3)

## 2023-04-18 LAB — TSH: TSH: 0.66 u[IU]/mL (ref 0.35–5.50)

## 2023-04-18 LAB — MICROALBUMIN / CREATININE URINE RATIO
Creatinine,U: 56.5 mg/dL
Microalb Creat Ratio: 4.1 mg/g (ref 0.0–30.0)
Microalb, Ur: 2.3 mg/dL — ABNORMAL HIGH (ref 0.0–1.9)

## 2023-04-18 LAB — PSA: PSA: 3.33 ng/mL (ref 0.10–4.00)

## 2023-04-26 ENCOUNTER — Ambulatory Visit: Admission: RE | Admit: 2023-04-26 | Payer: 59 | Source: Ambulatory Visit

## 2023-04-26 ENCOUNTER — Other Ambulatory Visit (HOSPITAL_COMMUNITY)
Admission: RE | Admit: 2023-04-26 | Discharge: 2023-04-26 | Disposition: A | Discharge status: Home / Self Care | Payer: 59 | Source: Ambulatory Visit

## 2023-04-26 DIAGNOSIS — E041 Nontoxic single thyroid nodule: Secondary | ICD-10-CM | POA: Diagnosis not present

## 2023-04-26 DIAGNOSIS — E0789 Other specified disorders of thyroid: Secondary | ICD-10-CM | POA: Diagnosis not present

## 2023-05-02 ENCOUNTER — Encounter (INDEPENDENT_AMBULATORY_CARE_PROVIDER_SITE_OTHER): Payer: Self-pay

## 2023-05-09 ENCOUNTER — Other Ambulatory Visit: Payer: Self-pay | Admitting: Adult Health

## 2023-06-07 ENCOUNTER — Ambulatory Visit (INDEPENDENT_AMBULATORY_CARE_PROVIDER_SITE_OTHER): Payer: 59

## 2023-06-07 VITALS — BP 120/60 | HR 61 | Temp 98.2°F | Ht 69.0 in | Wt 217.1 lb

## 2023-06-07 DIAGNOSIS — Z0000 Encounter for general adult medical examination without abnormal findings: Secondary | ICD-10-CM

## 2023-06-07 NOTE — Progress Notes (Signed)
Subjective:   AYRON FLORANCE is a 77 y.o. male who presents for Medicare Annual/Subsequent preventive examination.  Visit Complete: In person     Cardiac Risk Factors include: advanced age (>91men, >61 women);male gender;diabetes mellitus;hypertension     Objective:    Today's Vitals   06/07/23 1345  BP: 120/60  Pulse: 61  Temp: 98.2 F (36.8 C)  TempSrc: Oral  SpO2: 96%  Weight: 217 lb 1.6 oz (98.5 kg)  Height: 5\' 9"  (1.753 m)   Body mass index is 32.06 kg/m.     06/07/2023    1:55 PM 05/29/2022    2:03 PM 05/25/2021    2:41 PM 05/13/2020    9:51 AM 04/28/2018    8:31 AM 01/18/2016    7:51 AM 05/17/2015    5:41 PM  Advanced Directives  Does Patient Have a Medical Advance Directive? No Yes No No No No No  Type of Advance Directive  Living will       Would patient like information on creating a medical advance directive? No - Patient declined  No - Patient declined No - Patient declined   No - patient declined information    Current Medications (verified) Outpatient Encounter Medications as of 06/07/2023  Medication Sig   ACCU-CHEK GUIDE test strip USE TO CHECK BLOOD SUGAR ONCE  DAILY   Accu-Chek Softclix Lancets lancets 1 each by Other route 3 (three) times daily. E11.9   aspirin 81 MG tablet Take 81 mg by mouth daily.   Blood Glucose Monitoring Suppl (ACCU-CHEK GUIDE) w/Device KIT 1 each by Does not apply route 3 (three) times daily. E11.9   clotrimazole-betamethasone (LOTRISONE) cream Apply to feet and between toes bid x 6 weeks.   dapagliflozin propanediol (FARXIGA) 10 MG TABS tablet Take 1 tablet (10 mg total) by mouth daily before breakfast.   lisinopril (ZESTRIL) 20 MG tablet TAKE 1 TABLET BY MOUTH TWICE  DAILY   metFORMIN (GLUCOPHAGE) 1000 MG tablet Take 1 tablet (1,000 mg total) by mouth 2 (two) times daily with a meal.   rosuvastatin (CRESTOR) 40 MG tablet TAKE 1 TABLET BY MOUTH DAILY   Semaglutide (RYBELSUS) 7 MG TABS Take 1 tablet (7 mg total) by mouth  daily.   No facility-administered encounter medications on file as of 06/07/2023.    Allergies (verified) Januvia [sitagliptin], Naproxen sodium, and Other   History: Past Medical History:  Diagnosis Date   Allergy    Arthritis    Diabetes mellitus without complication (HCC)    GERD (gastroesophageal reflux disease)    Hyperlipidemia    Hypertension    Past Surgical History:  Procedure Laterality Date   COLON SURGERY  2005   due to injury at work per pt.    COLON SURGERY  2006   revision of ostomy bag   COLONOSCOPY  2006, 2017   KNEE SURGERY     right knee   Family History  Problem Relation Age of Onset   Cancer Mother    Diabetes Brother    Colon cancer Neg Hx    Social History   Socioeconomic History   Marital status: Married    Spouse name: Not on file   Number of children: Not on file   Years of education: Not on file   Highest education level: Not on file  Occupational History   Not on file  Tobacco Use   Smoking status: Never   Smokeless tobacco: Never  Vaping Use   Vaping status: Never Used  Substance and Sexual Activity   Alcohol use: No   Drug use: No   Sexual activity: Yes    Partners: Female  Other Topics Concern   Not on file  Social History Narrative   Regular exercise: walk   Caffeine use: 1 cup of coffee         Social Determinants of Health   Financial Resource Strain: Low Risk  (06/07/2023)   Overall Financial Resource Strain (CARDIA)    Difficulty of Paying Living Expenses: Not hard at all  Food Insecurity: No Food Insecurity (06/07/2023)   Hunger Vital Sign    Worried About Running Out of Food in the Last Year: Never true    Ran Out of Food in the Last Year: Never true  Transportation Needs: No Transportation Needs (06/07/2023)   PRAPARE - Administrator, Civil Service (Medical): No    Lack of Transportation (Non-Medical): No  Physical Activity: Insufficiently Active (05/29/2022)   Exercise Vital Sign    Days of  Exercise per Week: 5 days    Minutes of Exercise per Session: 20 min  Stress: No Stress Concern Present (06/07/2023)   [employer] of Occupational Health - Occupational Stress Questionnaire    Feeling of Stress : Not at all  Social Connections: Moderately Integrated (06/07/2023)   Social Connection and Isolation Panel [NHANES]    Frequency of Communication with Friends and Family: More than three times a week    Frequency of Social Gatherings with Friends and Family: More than three times a week    Attends Religious Services: More than 4 times per year    Active Member of [employer] or Organizations: Yes    Attends Engineer, structural: More than 4 times per year    Marital Status: Divorced    Tobacco Counseling Counseling given: Not Answered   Clinical Intake:  Pre-visit preparation completed: Yes  Pain : No/denies pain     BMI - recorded: 32.06 Nutritional Status: BMI > 30  Obese Nutritional Risks: None Diabetes: Yes CBG done?: Yes (CBG 123 Per patient) CBG resulted in Enter/ Edit results?: Yes Did pt. bring in CBG monitor from home?: No  How often do you need to have someone help you when you read instructions, pamphlets, or other written materials from your doctor or pharmacy?: 1 - Never  Interpreter Needed?: No  Information entered by :: Theresa Mulligan LPN   Activities of Daily Living    06/07/2023    1:52 PM 04/09/2023    8:49 AM  In your present state of health, do you have any difficulty performing the following activities:  Hearing? 0 1  Vision? 0 0  Difficulty concentrating or making decisions? 0 0  Walking or climbing stairs? 0 1  Comment  has a cane  Dressing or bathing? 0 0  Doing errands, shopping? 0 0  Preparing Food and eating ? N   Using the Toilet? N   In the past six months, have you accidently leaked urine? N   Do you have problems with loss of bowel control? N   Managing your Medications? N   Managing your Finances? N    Housekeeping or managing your Housekeeping? N     Patient Care Team: [name], NP as PCP - General (Family Medicine)  Indicate any recent Medical Services you may have received from other than [facility] providers in the past year (date may be approximate).     Assessment:   This is a routine  wellness examination for Jefferey.  Hearing/Vision screen Hearing Screening - Comments:: Denies hearing difficulties   Vision Screening - Comments:: Wears rx glasses - up to date with routine eye exams with  [facility]   Goals Addressed               This Visit's Progress     Patient Stated (pt-stated)        Staying healthy and making good choices.         Depression Screen    06/07/2023    1:51 PM 05/29/2022    2:06 PM 04/05/2022    8:52 AM 01/10/2022    9:53 AM 05/25/2021    2:42 PM 05/25/2021    2:38 PM 04/04/2021    8:45 AM  PHQ 2/9 Scores  PHQ - 2 Score 0 0 0 0 0 0 0    Fall Risk    06/07/2023    1:53 PM 04/09/2023    8:50 AM 05/29/2022    2:05 PM 04/05/2022    8:42 AM 01/18/2022    6:53 PM  Fall Risk   Falls in the past year? 1 1 0 1 0  Number falls in past yr: 0 0 0 0   Injury with Fall? 0 1 0 1   Risk for fall due to : No Fall Risks History of fall(s) Medication side effect History of fall(s)   Follow up Falls prevention discussed Falls evaluation completed Falls prevention discussed;Education provided;Falls evaluation completed Falls evaluation completed     MEDICARE RISK AT HOME: Medicare Risk at Home Any stairs in or around the home?: No If so, are there any without handrails?: No Home free of loose throw rugs in walkways, pet beds, electrical cords, etc?: Yes Adequate lighting in your home to reduce risk of falls?: Yes Life alert?: No Use of a cane, walker or w/c?: No Grab bars in the bathroom?: Yes Shower chair or bench in shower?: Yes Elevated toilet seat or a handicapped toilet?: Yes  TIMED UP AND GO:  Was the test performed?  Yes  Length of time  to ambulate 10 feet: 10 sec Gait steady and fast without use of assistive device    Cognitive Function:    04/28/2018    8:51 AM  MMSE - Mini Mental State Exam  Not completed: --        06/07/2023    1:55 PM 05/29/2022    2:10 PM  6CIT Screen  What Year? 0 points 0 points  What month? 0 points 0 points  What time? 0 points 0 points  Count back from 20 0 points 0 points  Months in reverse 0 points 2 points  Repeat phrase 4 points 4 points  Total Score 4 points 6 points    Immunizations Immunization History  Administered Date(s) Administered   Fluad Quad(high Dose 65+) 08/05/2019, 06/15/2021, 05/29/2022, 05/29/2022   Influenza Whole 06/24/2006, 07/07/2007, 08/03/2009   Influenza, High Dose Seasonal PF 07/18/2016, 09/30/2017   Influenza,inj,Quad PF,6+ Mos 07/27/2013, 08/16/2014, 07/15/2015, 07/30/2018   PFIZER(Purple Top)SARS-COV-2 Vaccination 11/13/2019, 12/09/2019, 09/12/2020   Pneumococcal Conjugate-13 08/16/2014   Pneumococcal Polysaccharide-23 10/29/2012, 04/01/2020   Td 04/02/2006   Tdap 09/25/2016    TDAP status: Up to date  Flu Vaccine status: Due, Education has been provided regarding the importance of this vaccine. Advised may receive this vaccine at local pharmacy or Health Dept. Aware to provide a copy of the vaccination record if obtained from local pharmacy or Health Dept. Verbalized acceptance and  understanding.  Pneumococcal vaccine status: Up to date  Covid-19 vaccine status: Declined, Education has been provided regarding the importance of this vaccine but patient still declined. Advised may receive this vaccine at local pharmacy or Health Dept.or vaccine clinic. Aware to provide a copy of the vaccination record if obtained from local pharmacy or Health Dept. Verbalized acceptance and understanding.  Qualifies for Shingles Vaccine? Yes   Zostavax completed No   Shingrix Completed?: No.    Education has been provided regarding the importance of this  vaccine. Patient has been advised to call insurance company to determine out of pocket expense if they have not yet received this vaccine. Advised may also receive vaccine at local pharmacy or Health Dept. Verbalized acceptance and understanding.  Screening Tests Health Maintenance  Topic Date Due   Zoster Vaccines- Shingrix (1 of 2) Never done   INFLUENZA VACCINE  04/18/2023   COVID-19 Vaccine (4 - 2023-24 season) 05/19/2023   HEMOGLOBIN A1C  06/27/2023   OPHTHALMOLOGY EXAM  10/12/2023   FOOT EXAM  12/04/2023   Diabetic kidney evaluation - eGFR measurement  04/17/2024   Diabetic kidney evaluation - Urine ACR  04/17/2024   Medicare Annual Wellness (AWV)  06/06/2024   Colonoscopy  01/31/2026   DTaP/Tdap/Td (3 - Td or Tdap) 09/25/2026   Pneumonia Vaccine 48+ Years old  Completed   Hepatitis C Screening  Completed   HPV VACCINES  Aged Out    Health Maintenance  Health Maintenance Due  Topic Date Due   Zoster Vaccines- Shingrix (1 of 2) Never done   INFLUENZA VACCINE  04/18/2023   COVID-19 Vaccine (4 - 2023-24 season) 05/19/2023    Colorectal cancer screening: Type of screening: Colonoscopy. Completed 02/01/16. Repeat every 10 years   Additional Screening:  Hepatitis C Screening: does qualify; Completed 09/30/17  Vision Screening: Recommended annual ophthalmology exams for early detection of glaucoma and other disorders of the eye. Is the patient up to date with their annual eye exam?  Yes  Who is the provider or what is the name of the office in which the patient attends annual eye exams? [facility] If pt is not established with a provider, would they like to be referred to a provider to establish care? No .   Dental Screening: Recommended annual dental exams for proper oral hygiene  Diabetic Foot Exam: Diabetic Foot Exam: Completed 12/04/22  Community Resource Referral / Chronic Care Management:  CRR required this visit?  No   CCM required this visit?  No      Plan:     I have personally reviewed and noted the following in the patient's chart:   Medical and social history Use of alcohol, tobacco or illicit drugs  Current medications and supplements including opioid prescriptions. Patient is not currently taking opioid prescriptions. Functional ability and status Nutritional status Physical activity Advanced directives List of other physicians Hospitalizations, surgeries, and ER visits in previous 12 months Vitals Screenings to include cognitive, depression, and falls Referrals and appointments  In addition, I have reviewed and discussed with patient certain preventive protocols, quality metrics, and best practice recommendations. A written personalized care plan for preventive services as well as general preventive health recommendations were provided to patient.     Tillie Rung, LPN   [id number]   After Visit Summary: Given Nurse Notes: None

## 2023-06-07 NOTE — Patient Instructions (Addendum)
[name] , Thank you for taking time to come for your Medicare Wellness Visit. I appreciate your ongoing commitment to your health goals. Please review the following plan we discussed and let me know if I can assist you in the future.   Referrals/Orders/Follow-Ups/Clinician Recommendations:   This is a list of the screening recommended for you and due dates:  Health Maintenance  Topic Date Due   Zoster (Shingles) Vaccine (1 of 2) Never done   Flu Shot  04/18/2023   COVID-19 Vaccine (4 - 2023-24 season) 05/19/2023   Hemoglobin A1C  06/27/2023   Eye exam for diabetics  10/12/2023   Complete foot exam   12/04/2023   Yearly kidney function blood test for diabetes  04/17/2024   Yearly kidney health urinalysis for diabetes  04/17/2024   Medicare Annual Wellness Visit  06/06/2024   Colon Cancer Screening  01/31/2026   DTaP/Tdap/Td vaccine (3 - Td or Tdap) 09/25/2026   Pneumonia Vaccine  Completed   Hepatitis C Screening  Completed   HPV Vaccine  Aged Out    Advanced directives: (Declined) Advance directive discussed with you today. Even though you declined this today, please call our office should you change your mind, and we can give you the proper paperwork for you to fill out.  Next Medicare Annual Wellness Visit scheduled for next year: Yes

## 2023-07-01 ENCOUNTER — Ambulatory Visit (INDEPENDENT_AMBULATORY_CARE_PROVIDER_SITE_OTHER): Payer: 59 | Admitting: Internal Medicine

## 2023-07-01 ENCOUNTER — Encounter: Payer: Self-pay | Admitting: Internal Medicine

## 2023-07-01 VITALS — BP 126/70 | HR 71 | Ht 69.0 in | Wt 220.2 lb

## 2023-07-01 DIAGNOSIS — E785 Hyperlipidemia, unspecified: Secondary | ICD-10-CM

## 2023-07-01 DIAGNOSIS — Z23 Encounter for immunization: Secondary | ICD-10-CM | POA: Diagnosis not present

## 2023-07-01 DIAGNOSIS — E1121 Type 2 diabetes mellitus with diabetic nephropathy: Secondary | ICD-10-CM | POA: Diagnosis not present

## 2023-07-01 DIAGNOSIS — E66811 Obesity, class 1: Secondary | ICD-10-CM | POA: Diagnosis not present

## 2023-07-01 DIAGNOSIS — Z7984 Long term (current) use of oral hypoglycemic drugs: Secondary | ICD-10-CM | POA: Diagnosis not present
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
## 2023-07-01 NOTE — Patient Instructions (Signed)
Please continue: - Metformin 1000 mg 2x a day with meals - Farxiga 10 mg before b'fast - Rybelsus 7 mg before b'fast  Please return in 6 months with your sugar log.

## 2023-07-03 LAB — FRUCTOSAMINE: Fructosamine: 332 umol/L — ABNORMAL HIGH (ref 205–285)

## 2023-08-13 ENCOUNTER — Encounter: Payer: Self-pay | Admitting: Podiatry

## 2023-08-13 ENCOUNTER — Ambulatory Visit (INDEPENDENT_AMBULATORY_CARE_PROVIDER_SITE_OTHER): Payer: 59 | Admitting: Podiatry

## 2023-08-13 DIAGNOSIS — L84 Corns and callosities: Secondary | ICD-10-CM

## 2023-08-13 DIAGNOSIS — B351 Tinea unguium: Secondary | ICD-10-CM

## 2023-08-13 DIAGNOSIS — E1121 Type 2 diabetes mellitus with diabetic nephropathy: Secondary | ICD-10-CM | POA: Diagnosis not present

## 2023-08-13 DIAGNOSIS — M79675 Pain in left toe(s): Secondary | ICD-10-CM

## 2023-08-13 DIAGNOSIS — M79674 Pain in right toe(s): Secondary | ICD-10-CM | POA: Diagnosis not present

## 2023-08-13 NOTE — Progress Notes (Signed)
Subjective:  Patient ID: Billy Hansen, male    DOB: 1946-04-05,  MRN: [id number]  77 y.o. male presents preventative diabetic foot care and callus(es) of both feet and painful thick toenails that are difficult to trim. Painful toenails interfere with ambulation. Aggravating factors include wearing enclosed shoe gear. Pain is relieved with periodic professional debridement. Painful calluses are aggravated when weightbearing with and without shoegear. Pain is relieved with periodic professional debridement.  Chief Complaint  Patient presents with   Diabetes    PATIENT STATES HIS FEET HAVE BEEN GOOD SINCE LAST VISIT, PATIENT STATES HE SAW HIS PCP LAST MONTH, PATIENT A1C WAS AROUND 7.1    New problem(s): None   PCP is Billy Frees, NP , and last visit was April 26, 2023.  Allergies  Allergen Reactions   Januvia [Sitagliptin] Other (See Comments)    HA, fatigue   Naproxen Sodium Itching   Other     Some type of anesthetic following colonoscopy/ notes states Propofol    Review of Systems: Negative except as noted in the HPI.   Objective:  Billy Hansen is a pleasant 77 y.o. male WD, WN in NAD.Marland Kitchen AAO x 3.  Vascular Examination: Capillary refill time <3 seconds b/l LE. Palpable DP pulses b/l LE; diminished PT pulses b/l. Digital hair sparse b/l. No pedal edema b/l. Skin temperature gradient WNL b/l. No varicosities b/l.   Dermatological Examination: Pedal skin with normal turgor, texture and tone b/l. No open wounds. No interdigital macerations b/l. Toenails 1-5 b/l thickened, discolored, dystrophic with subungual debris. There is pain on palpation to dorsal aspect of nailplates.   Hyperkeratotic lesion(s) submet head 5 b/l.  No erythema, no edema, no drainage, no fluctuance.   Porokeratotic lesion(s) R 2nd toe. No erythema, no edema, no drainage, no fluctuance..  Neurological Examination: Protective sensation intact with 10 gram monofilament b/l LE. Vibratory sensation intact  b/l LE.   Musculoskeletal Examination: Normal muscle strength 5/5 to all lower extremity muscle groups bilaterally. No pain, crepitus or joint limitation noted with ROM b/l LE. No gross bony pedal deformities b/l. Patient ambulates independently without assistive aids.  Radiographs: None  Last A1c:      Latest Ref Rng & Units 12/26/2022    9:08 AM  Hemoglobin A1C  Hemoglobin-A1c 4.0 - 5.6 % 7.7      Assessment:   1. Pain due to onychomycosis of toenails of both feet   2. Callus   3. Type 2 diabetes mellitus with diabetic nephropathy, without long-term current use of insulin (HCC)    Plan:  -Consent given for treatment as described below: -Examined patient. -Toenails 1-5 b/l were debrided in length and girth with sterile nail nippers and dremel without iatrogenic bleeding.  -Callus(es) submet head 5 b/l pared utilizing sterile scalpel blade without complication or incident. Total number debrided =2. -Porokeratotic lesion(s) left second digit pared and enucleated with sterile currette without incident. Total number of lesions debrided=1. -Patient/POA to call should there be question/concern in the interim.  Return in about 3 months (around 11/13/2023).  Billy Hansen, DPM      [facility] LOCATION: [address], [place] [address]  Office [phone]   [facility] LOCATION: [address] [place], [place] [address] Office [phone]

## 2023-08-30 ENCOUNTER — Other Ambulatory Visit: Payer: Self-pay | Admitting: Adult Health

## 2023-08-30 DIAGNOSIS — I10 Essential (primary) hypertension: Secondary | ICD-10-CM

## 2023-09-03 ENCOUNTER — Ambulatory Visit (INDEPENDENT_AMBULATORY_CARE_PROVIDER_SITE_OTHER): Payer: 59 | Admitting: Adult Health

## 2023-09-03 ENCOUNTER — Encounter: Payer: Self-pay | Admitting: Adult Health

## 2023-09-03 VITALS — BP 128/80 | HR 86 | Temp 98.0°F | Ht 69.0 in | Wt 219.0 lb

## 2023-09-03 DIAGNOSIS — S39012A Strain of muscle, fascia and tendon of lower back, initial encounter: Secondary | ICD-10-CM

## 2023-09-03 MED ORDER — CYCLOBENZAPRINE HCL 10 MG PO TABS
10.0000 mg | ORAL_TABLET | Freq: Every day | ORAL | 0 refills | Status: AC
Start: 2023-09-03 — End: ?

## 2023-09-03 NOTE — Progress Notes (Signed)
Subjective:    Patient ID: Billy Hansen, male    DOB: [birth year], 77 y.o.   MRN: [id number]  HPI 77 year old male who  has a past medical history of Allergy, Arthritis, Diabetes mellitus without complication (HCC), GERD (gastroesophageal reflux disease), Hyperlipidemia, and Hypertension.  He presents to the office today for an acute issue. He reports that he has had low back pain x two weeks. He reports that the day before his symptoms started he was working and was on his feet all day.   The next day his low back was sore and he had trouble getting out of his chair and his back tightened up. He reports that when he changes positions he gets pain down the outside of both legs. Once he is up walking the pain resolved. Does have pain with twisting and bending motions.   At home he was using Motrin 800 mg and this helped to some degree for a few hours.   His pain has improved but is still present    Review of Systems See HPI   Past Medical History:  Diagnosis Date   Allergy    Arthritis    Diabetes mellitus without complication (HCC)    GERD (gastroesophageal reflux disease)    Hyperlipidemia    Hypertension     Social History   Socioeconomic History   Marital status: Married    Spouse name: Not on file   Number of children: Not on file   Years of education: Not on file   Highest education level: Not on file  Occupational History   Not on file  Tobacco Use   Smoking status: Never   Smokeless tobacco: Never  Vaping Use   Vaping status: Never Used  Substance and Sexual Activity   Alcohol use: No   Drug use: No   Sexual activity: Yes    Partners: Female  Other Topics Concern   Not on file  Social History Narrative   Regular exercise: walk   Caffeine use: 1 cup of coffee         Social Drivers of Corporate investment banker Strain: Low Risk  (06/07/2023)   Overall Financial Resource Strain (CARDIA)    Difficulty of Paying Living Expenses: Not hard at all   Food Insecurity: No Food Insecurity (06/07/2023)   Hunger Vital Sign    Worried About Running Out of Food in the Last Year: Never true    Ran Out of Food in the Last Year: Never true  Transportation Needs: No Transportation Needs (06/07/2023)   PRAPARE - Administrator, Civil Service (Medical): No    Lack of Transportation (Non-Medical): No  Physical Activity: Insufficiently Active (05/29/2022)   Exercise Vital Sign    Days of Exercise per Week: 5 days    Minutes of Exercise per Session: 20 min  Stress: No Stress Concern Present (06/07/2023)   [employer] of Occupational Health - Occupational Stress Questionnaire    Feeling of Stress : Not at all  Social Connections: Moderately Integrated (06/07/2023)   Social Connection and Isolation Panel [NHANES]    Frequency of Communication with Friends and Family: More than three times a week    Frequency of Social Gatherings with Friends and Family: More than three times a week    Attends Religious Services: More than 4 times per year    Active Member of [employer] or Organizations: Yes    Attends Banker Meetings: More than 4  times per year    Marital Status: Divorced  Intimate Partner Violence: Not At Risk (06/07/2023)   Humiliation, Afraid, Rape, and Kick questionnaire    Fear of Current or Ex-Partner: No    Emotionally Abused: No    Physically Abused: No    Sexually Abused: No    Past Surgical History:  Procedure Laterality Date   COLON SURGERY  2005   due to injury at work per pt.    COLON SURGERY  2006   revision of ostomy bag   COLONOSCOPY  2006, 2017   KNEE SURGERY     right knee    Family History  Problem Relation Age of Onset   Cancer Mother    Diabetes Brother    Colon cancer Neg Hx     Allergies  Allergen Reactions   Januvia [Sitagliptin] Other (See Comments)    HA, fatigue   Naproxen Sodium Itching   Other     Some type of anesthetic following colonoscopy/ notes states Propofol     Current Outpatient Medications on File Prior to Visit  Medication Sig Dispense Refill   ACCU-CHEK GUIDE test strip USE TO CHECK BLOOD SUGAR ONCE  DAILY 100 strip 2   Accu-Chek Softclix Lancets lancets 1 each by Other route 3 (three) times daily. E11.9 100 each 2   aspirin 81 MG tablet Take 81 mg by mouth daily.     Blood Glucose Monitoring Suppl (ACCU-CHEK GUIDE) w/Device KIT 1 each by Does not apply route 3 (three) times daily. E11.9 1 kit 0   clotrimazole-betamethasone (LOTRISONE) cream Apply to feet and between toes bid x 6 weeks. 45 g 0   dapagliflozin propanediol (FARXIGA) 10 MG TABS tablet Take 1 tablet (10 mg total) by mouth daily before breakfast. 100 tablet 3   lisinopril (ZESTRIL) 20 MG tablet TAKE 1 TABLET BY MOUTH TWICE  DAILY 200 tablet 2   metFORMIN (GLUCOPHAGE) 1000 MG tablet Take 1 tablet (1,000 mg total) by mouth 2 (two) times daily with a meal. 200 tablet 3   rosuvastatin (CRESTOR) 40 MG tablet TAKE 1 TABLET BY MOUTH DAILY 100 tablet 2   Semaglutide (RYBELSUS) 7 MG TABS Take 1 tablet (7 mg total) by mouth daily. 100 tablet 3   No current facility-administered medications on file prior to visit.    BP 128/80   Pulse 86   Temp 98 F (36.7 C) (Oral)   Ht 5\' 9"  (1.753 m)   Wt 219 lb (99.3 kg)   SpO2 95%   BMI 32.34 kg/m       Objective:   Physical Exam Vitals and nursing note reviewed.  Constitutional:      Appearance: Normal appearance.  Cardiovascular:     Rate and Rhythm: Normal rate and regular rhythm.     Pulses: Normal pulses.     Heart sounds: Normal heart sounds.  Pulmonary:     Effort: Pulmonary effort is normal.     Breath sounds: Normal breath sounds.  Musculoskeletal:        General: Tenderness present.     Cervical back: Spasms and tenderness present. No bony tenderness. Pain with movement present. Decreased range of motion.       Back:  Skin:    General: Skin is warm and dry.  Neurological:     General: No focal deficit present.      Mental Status: He is alert and oriented to person, place, and time.  Psychiatric:  Mood and Affect: Mood normal.        Behavior: Behavior normal.        Thought Content: Thought content normal.        Judgment: Judgment normal.       Assessment & Plan:  1. Strain of lumbar region, initial encounter (Primary) - Will send in short course of Flexeril. He can continue to take Motrin as needed - Follow up if not resolving in the next 2-3 days  - cyclobenzaprine (FLEXERIL) 10 MG tablet; Take 1 tablet (10 mg total) by mouth at bedtime.  Dispense: 15 tablet; Refill: 0

## 2023-10-14 DIAGNOSIS — E119 Type 2 diabetes mellitus without complications: Secondary | ICD-10-CM | POA: Diagnosis not present

## 2023-10-14 DIAGNOSIS — H2513 Age-related nuclear cataract, bilateral: Secondary | ICD-10-CM | POA: Diagnosis not present

## 2023-10-14 DIAGNOSIS — H40013 Open angle with borderline findings, low risk, bilateral: Secondary | ICD-10-CM | POA: Diagnosis not present

## 2023-10-24 ENCOUNTER — Other Ambulatory Visit: Payer: Self-pay | Admitting: Internal Medicine

## 2023-10-24 DIAGNOSIS — E1121 Type 2 diabetes mellitus with diabetic nephropathy: Secondary | ICD-10-CM

## 2023-10-28 ENCOUNTER — Telehealth: Payer: Self-pay | Admitting: Pharmacy Technician

## 2023-10-28 ENCOUNTER — Other Ambulatory Visit (HOSPITAL_COMMUNITY): Payer: Self-pay

## 2023-10-28 NOTE — Telephone Encounter (Signed)
 Pharmacy Patient Advocate Encounter   Received notification from CoverMyMeds that prior authorization for Rybelsus  7mg  is due for renewal.   Insurance verification completed.   The patient is insured through [facility].  Action: PA required; PA submitted to above mentioned insurance via CoverMyMeds Key/confirmation #/EOC [facility] Status is pending

## 2023-10-29 NOTE — Telephone Encounter (Signed)
Pharmacy Patient Advocate Encounter  Received notification from [facility] that Prior Authorization for Rybelsus has been CANCELLED due to medication or product is on your plan's list of covered drugs. Prior authorization is not required at this time

## 2023-10-30 NOTE — Telephone Encounter (Signed)
Pt has been notified and voices understanding.

## 2023-11-02 ENCOUNTER — Other Ambulatory Visit: Payer: Self-pay | Admitting: Internal Medicine

## 2023-11-02 DIAGNOSIS — E1121 Type 2 diabetes mellitus with diabetic nephropathy: Secondary | ICD-10-CM

## 2023-11-29 IMAGING — DX DG KNEE 1-2V*R*
2 series · 2 of 2 positions shown · non-contrast
Comparison: None.

CLINICAL DATA: Chronic right knee pain.

EXAM:
RIGHT KNEE - 1-2 VIEW

[knee ap]
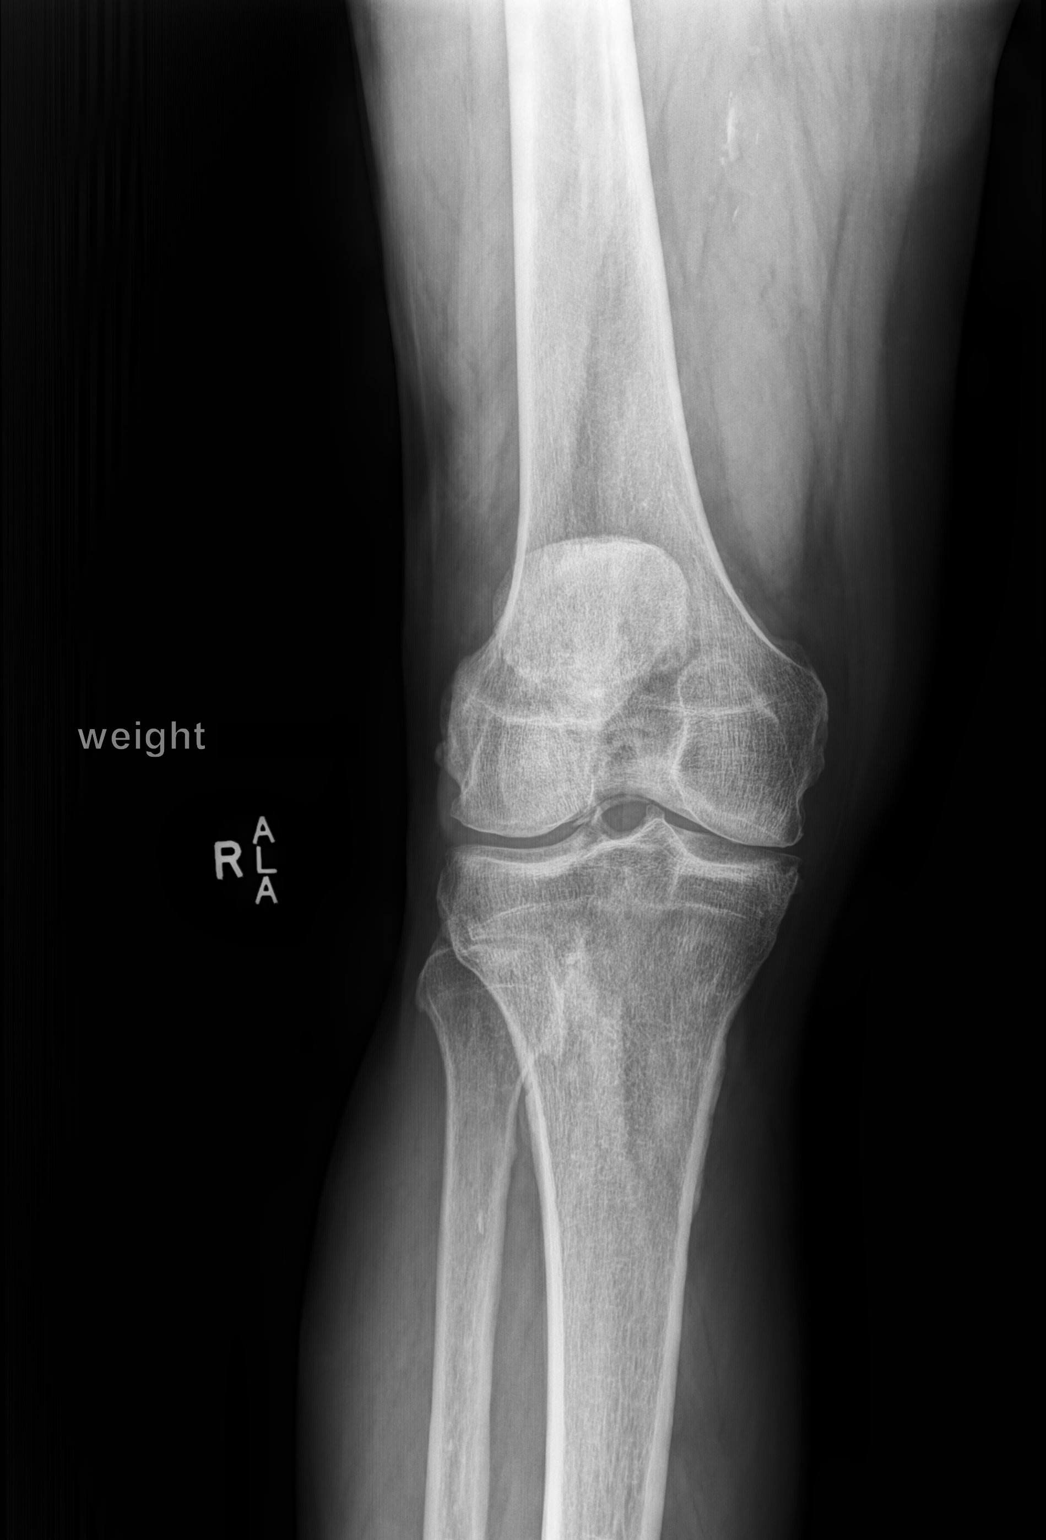

[knee lat]
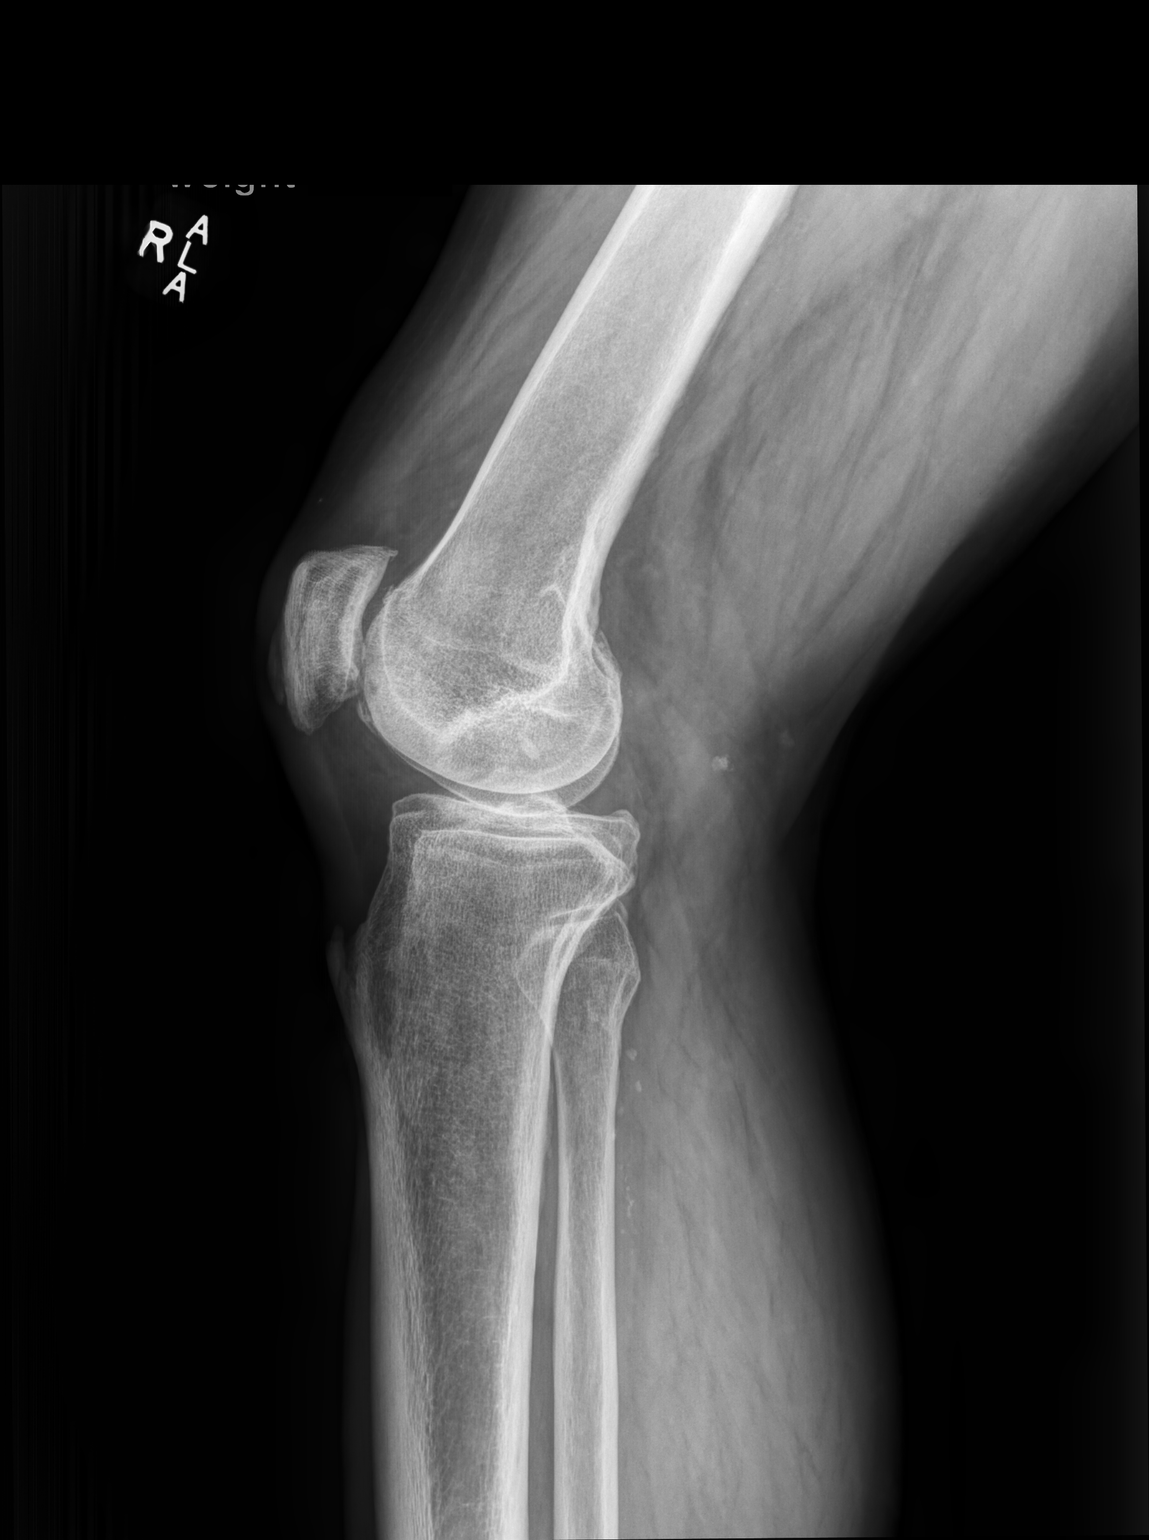

[2 of 2 positions shown; findings below may reference images not displayed]

FINDINGS: There is diffuse decreased bone mineralization. Mild-to-moderate
medial compartment joint space narrowing. Moderate patellofemoral
joint space narrowing and peripheral degenerative osteophytes. No
joint effusion. Mild chronic spurring at the patellar insertion on
the tibial tubercle. No acute fracture or dislocation. Vascular
calcifications are noted.
IMPRESSION: :
IMPRESSION: 1. Mild-to-moderate medial and patellofemoral compartment
osteoarthritis.
2. Mild chronic spurring at the patellar tendon insertion on the
tibial tubercle.

## 2023-11-29 IMAGING — DX DG KNEE 1-2V*L*
2 series · 2 of 2 positions shown · non-contrast
Comparison: None.

CLINICAL DATA: Chronic left knee pain.

EXAM:
LEFT KNEE - 1-2 VIEW

[knee ap]
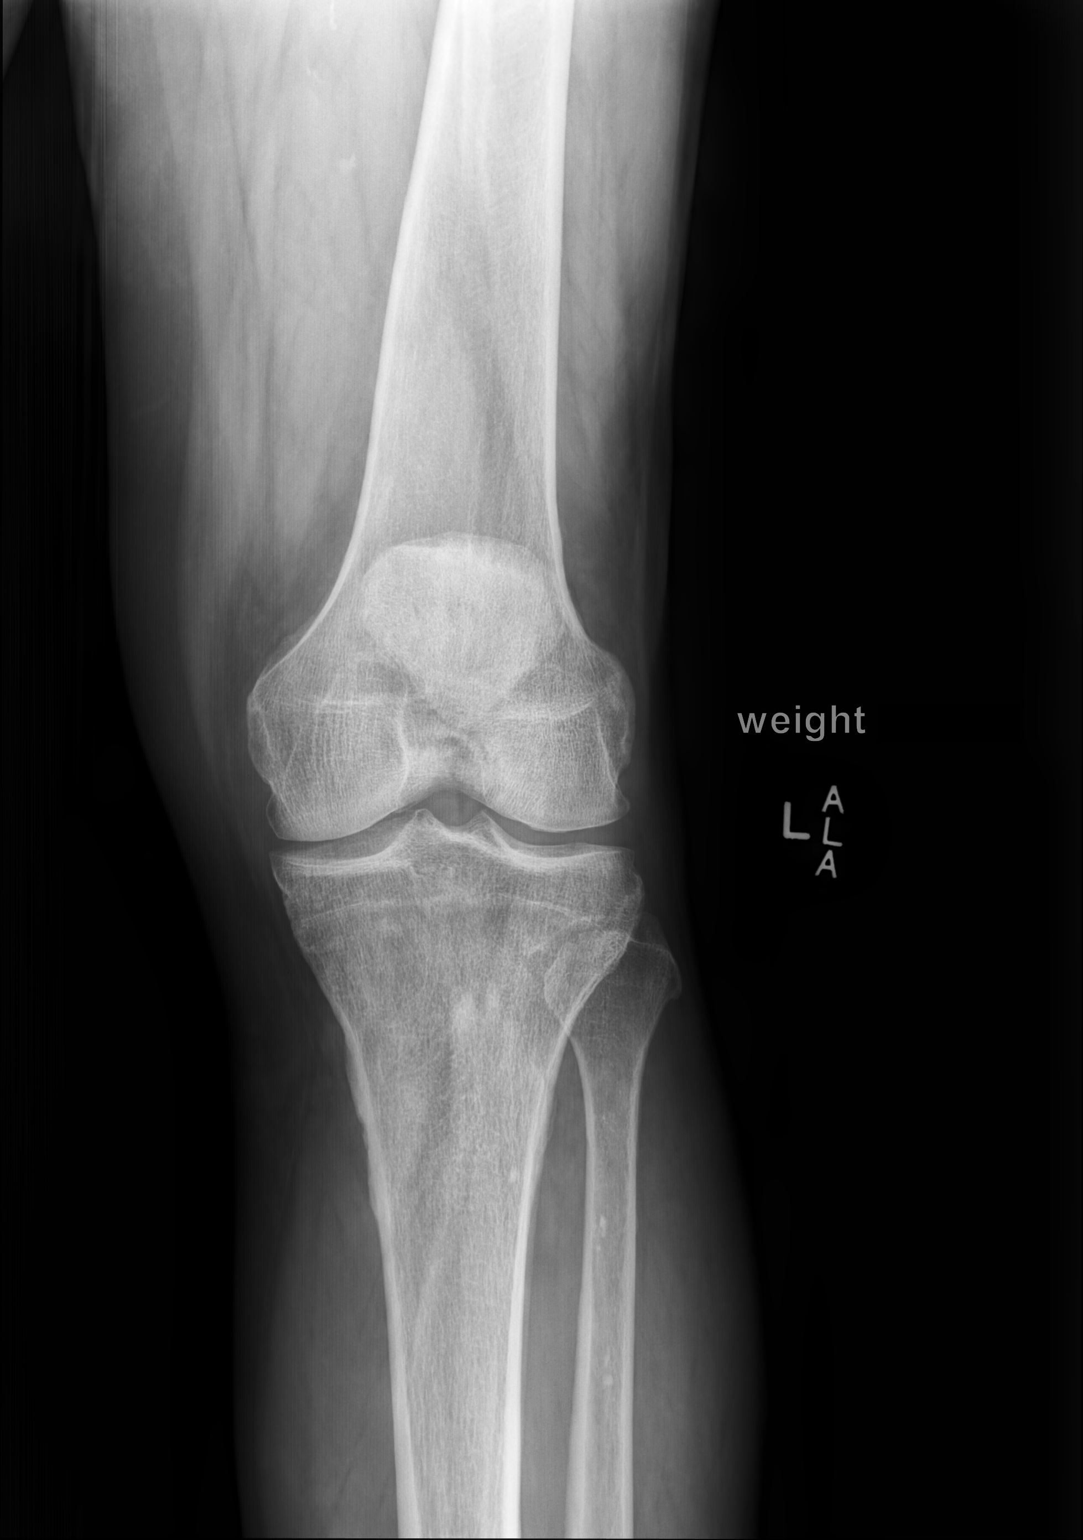

[knee lat]
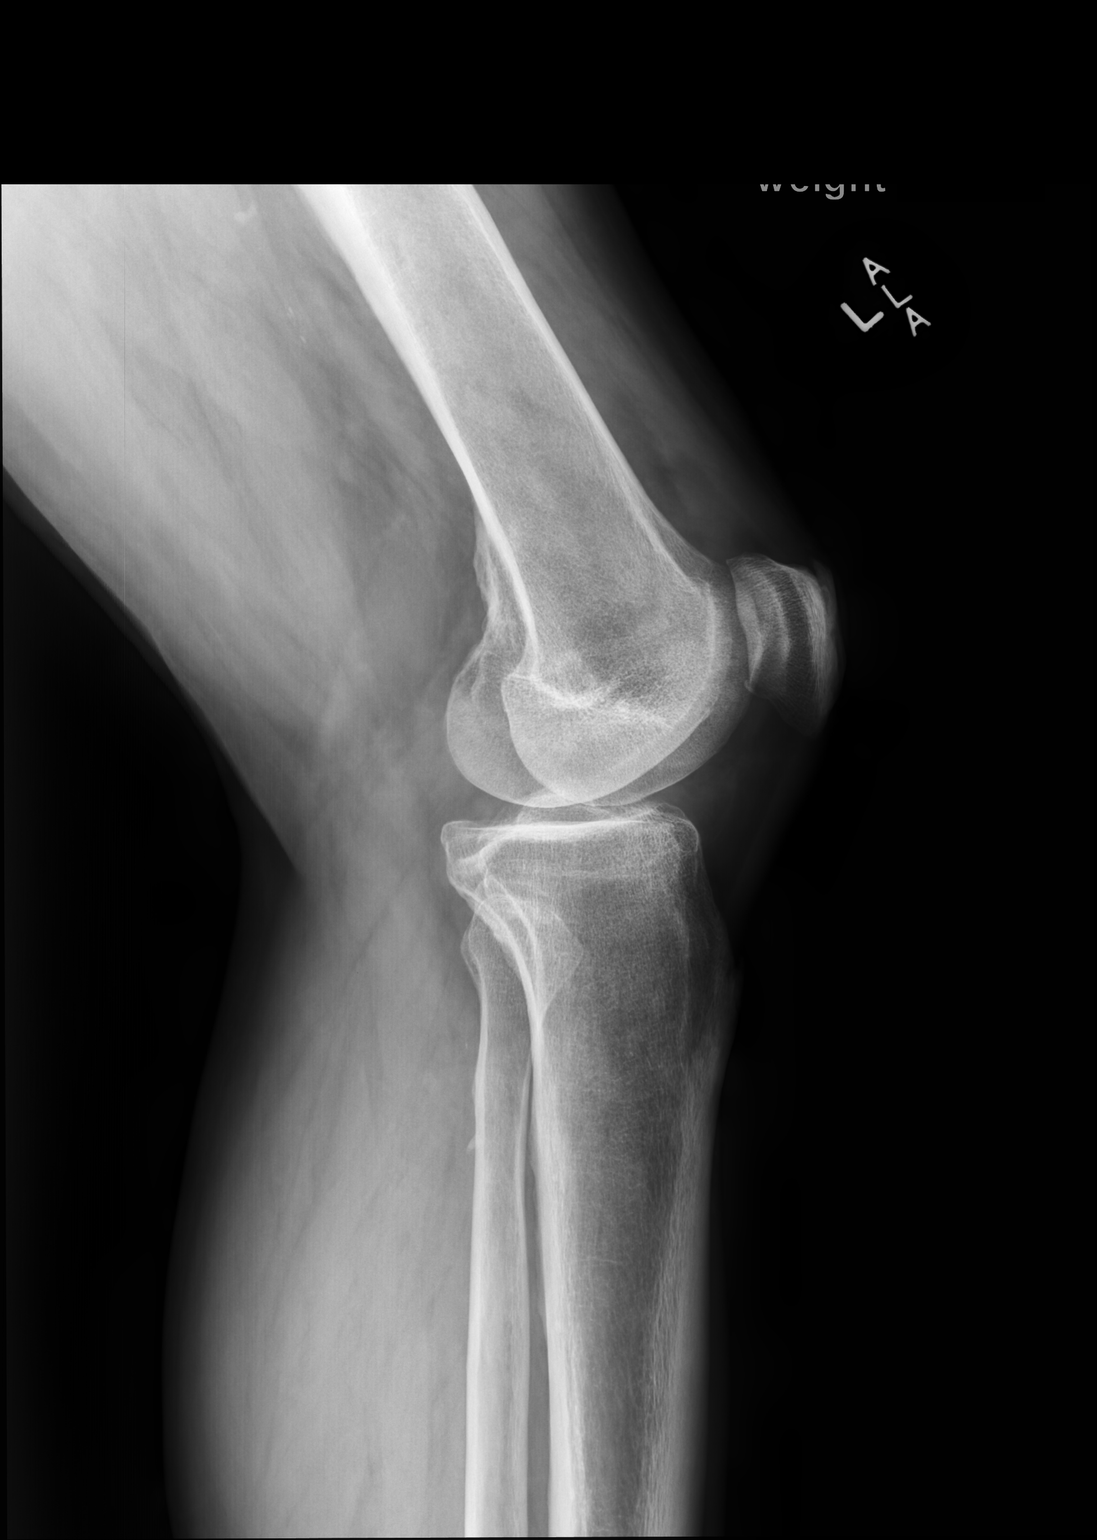

[2 of 2 positions shown; findings below may reference images not displayed]

FINDINGS: Mild medial compartment joint space narrowing. Mild chronic
enthesopathic change at the quadriceps insertion on the patella.
Tiny joint effusion. Mild patellofemoral joint space narrowing and
peripheral superior and inferior patellar spurs. No acute fracture
or dislocation. Mild vascular calcifications.
IMPRESSION: Mild medial and patellofemoral compartment osteoarthritis.

## 2023-11-29 IMAGING — DX DG LUMBAR SPINE COMPLETE 4+V
5 series · 5 of 5 positions shown · non-contrast
Comparison: None.

CLINICAL DATA: Chronic low back pain

EXAM:
LUMBAR SPINE - COMPLETE 4+ VIEW

[lumbar spine ap]
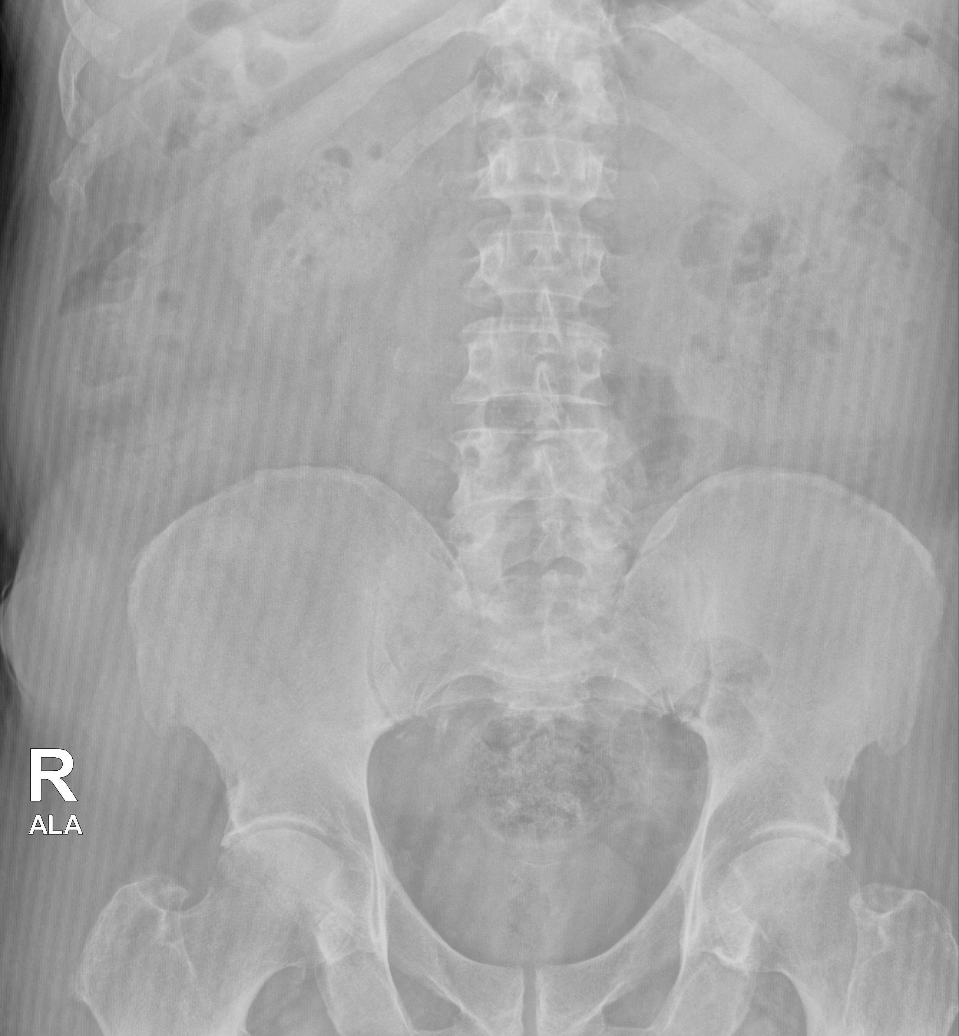

[lumbar spine oblique (1 of 2)]
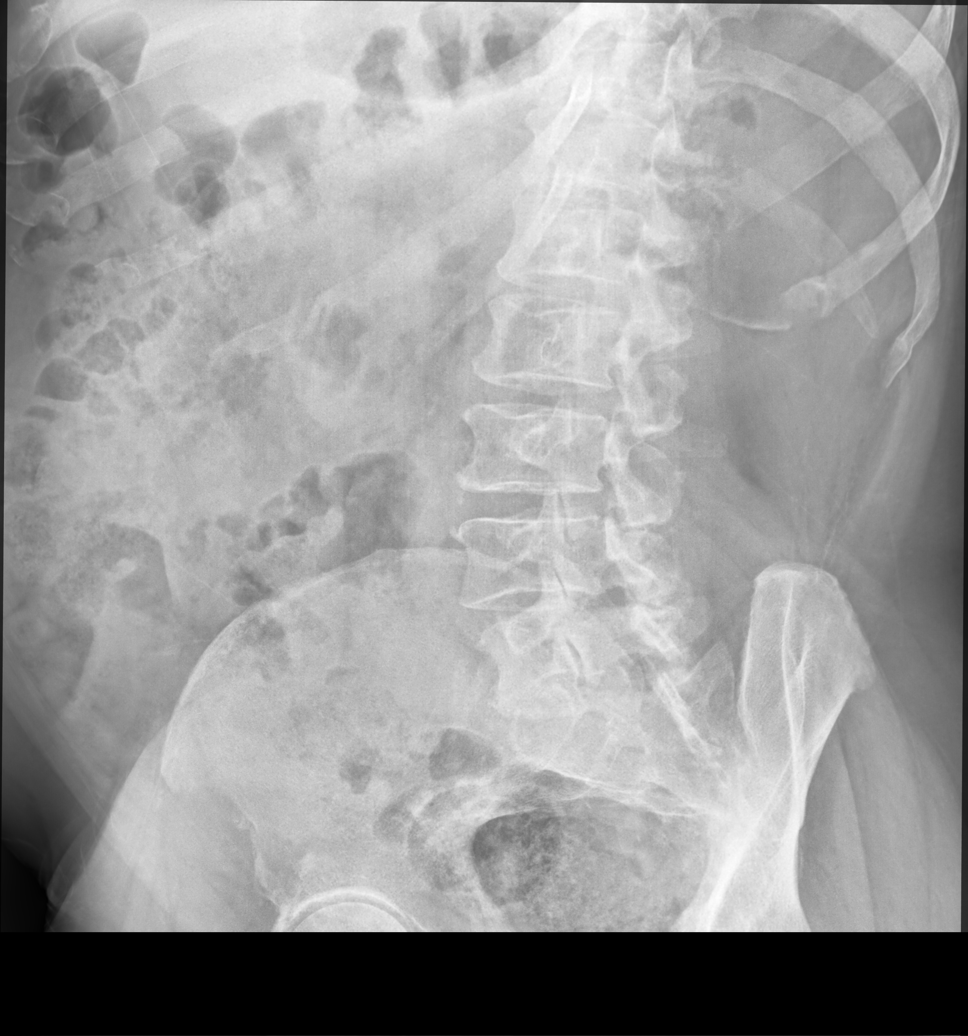

[lumbar spine oblique (2 of 2)]
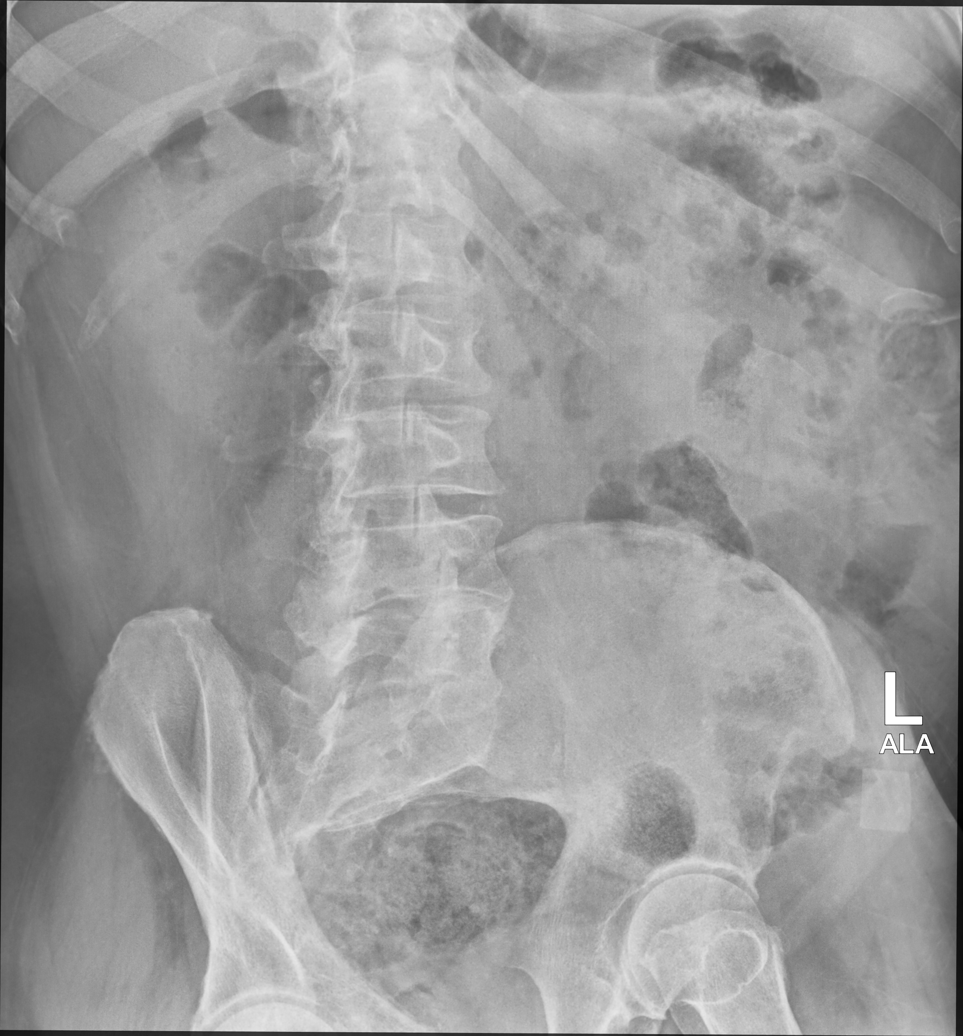

[lumbar spine lat (1 of 2)]
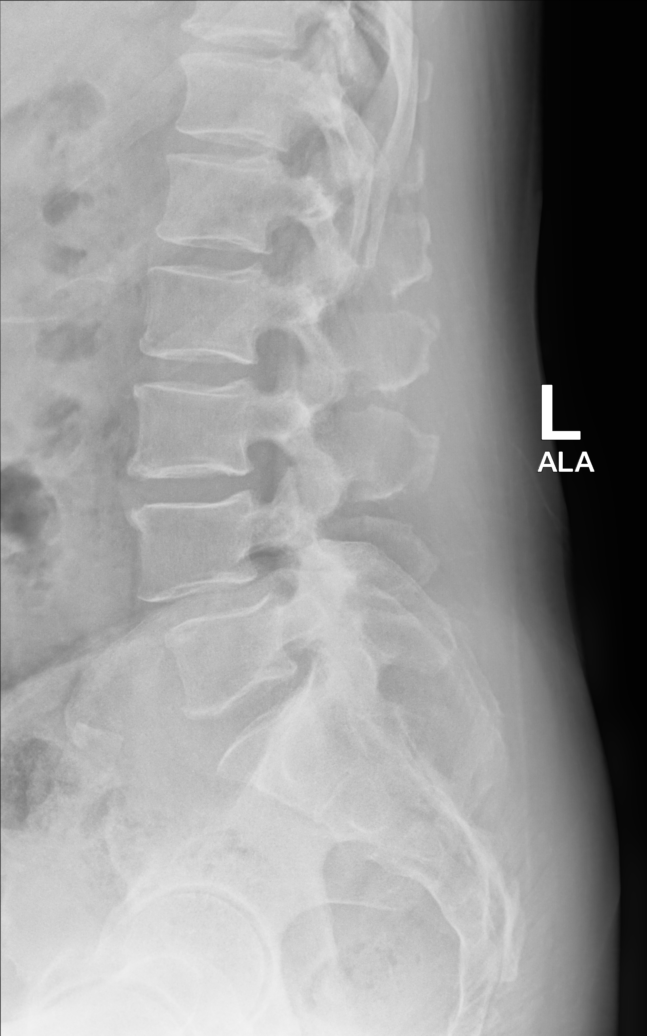

[lumbar spine lat (2 of 2)]
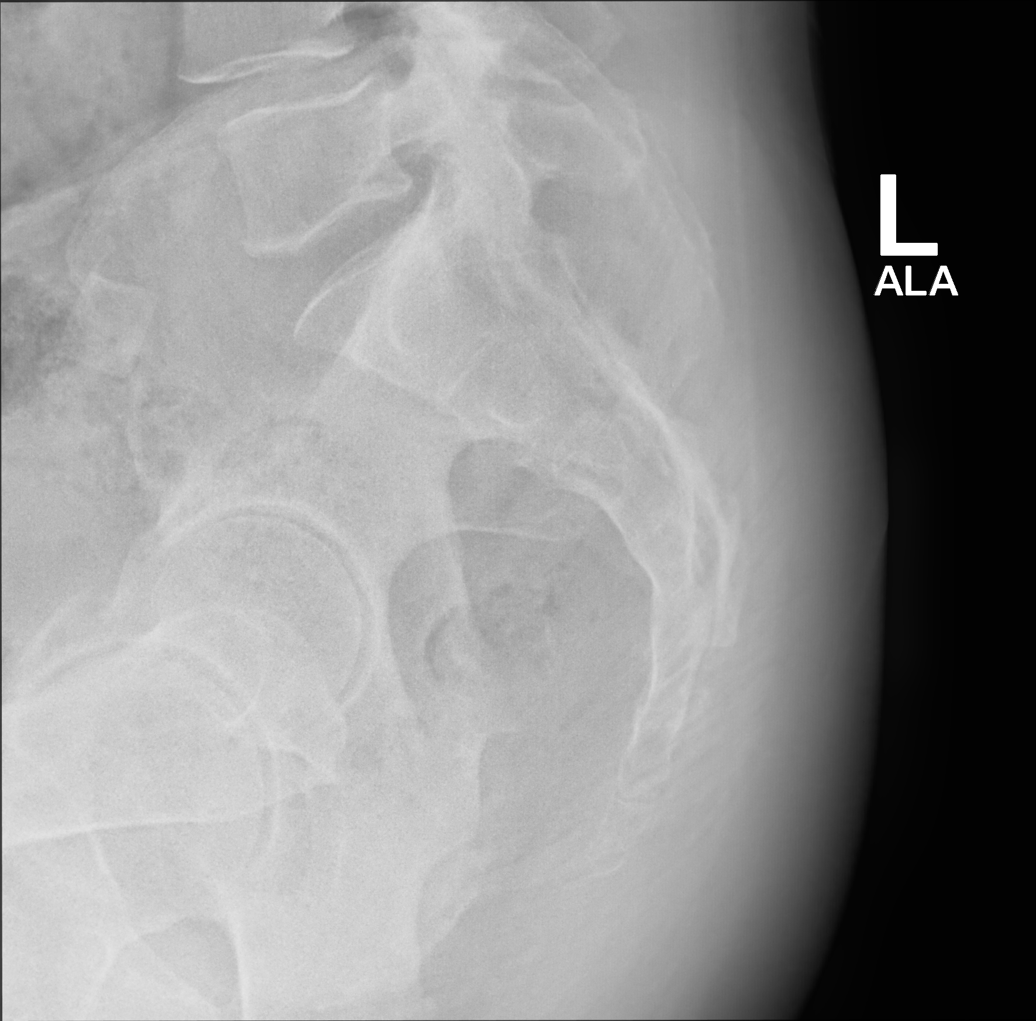

[5 of 5 positions shown; findings below may reference images not displayed]

FINDINGS: Grade 1 anterolisthesis L4-5. No acute fracture of the lumbar spine.
Vertebral body height is preserved. Intervertebral disc height is
preserved. Facet arthrosis at L4-S1 is present, not well profiled on
this exam.
IMPRESSION: Facet arthrosis L4-S1 with associated grade 1 anterolisthesis L4-5.

## 2023-12-17 ENCOUNTER — Ambulatory Visit (INDEPENDENT_AMBULATORY_CARE_PROVIDER_SITE_OTHER): Payer: 59 | Admitting: Podiatry

## 2023-12-17 ENCOUNTER — Encounter: Payer: Self-pay | Admitting: Podiatry

## 2023-12-17 VITALS — Ht 69.0 in | Wt 219.0 lb

## 2023-12-17 DIAGNOSIS — L84 Corns and callosities: Secondary | ICD-10-CM

## 2023-12-17 DIAGNOSIS — M79674 Pain in right toe(s): Secondary | ICD-10-CM | POA: Diagnosis not present

## 2023-12-17 DIAGNOSIS — B351 Tinea unguium: Secondary | ICD-10-CM

## 2023-12-17 DIAGNOSIS — E1121 Type 2 diabetes mellitus with diabetic nephropathy: Secondary | ICD-10-CM

## 2023-12-17 DIAGNOSIS — M79675 Pain in left toe(s): Secondary | ICD-10-CM | POA: Diagnosis not present

## 2023-12-17 NOTE — Progress Notes (Signed)
  Subjective:  Patient ID: Billy Hansen, male    DOB: 24-Dec-1945,  MRN: [id number]  Billy Hansen presents to clinic today for: at risk foot care. Pt has h/o NIDDM with chronic kidney disease and callus(es) of both feet and painful thick toenails that are difficult to trim. Painful toenails interfere with ambulation. Aggravating factors include wearing enclosed shoe gear. Pain is relieved with periodic professional debridement. Painful calluses are aggravated when weightbearing with and without shoegear. Pain is relieved with periodic professional debridement.  Chief Complaint  Patient presents with   dfc    He is here for a nail trim, A1C was "160", PCP is nafzinger, and seen 1 time a year for physical:     PCP is [name], NP.  Allergies  Allergen Reactions   Januvia [Sitagliptin] Other (See Comments)    HA, fatigue   Naproxen Sodium Itching   Other     Some type of anesthetic following colonoscopy/ notes states Propofol    Review of Systems: Negative except as noted in the HPI.  Objective: No changes noted in today's physical examination. There were no vitals filed for this visit.  Billy Hansen is a pleasant 78 y.o. male WD, WN in NAD. AAO x 3.  Vascular Examination: Capillary refill time <3 seconds b/l LE. Palpable pedal pulses b/l LE. Digital hair present b/l. No pedal edema b/l. Skin temperature gradient WNL b/l. No varicosities b/l. Marland Kitchen  Dermatological Examination: Pedal skin with normal turgor, texture and tone b/l. No open wounds. No interdigital macerations b/l. Toenails 1-5 b/l thickened, discolored, dystrophic with subungual debris. There is pain on palpation to dorsal aspect of nailplates. Hyperkeratotic lesion(s) submet head 5 b/l.  No erythema, no edema, no drainage, no fluctuance..  Neurological Examination: Protective sensation intact with 10 gram monofilament b/l LE.   Musculoskeletal Examination: Muscle strength 5/5 to all lower extremity muscle  groups bilaterally. No pain, crepitus or joint limitation noted with ROM bilateral LE. No gross bony deformities bilaterally.     Latest Ref Rng & Units 12/26/2022    9:08 AM  Hemoglobin A1C  Hemoglobin-A1c 4.0 - 5.6 % 7.7    Assessment/Plan: 1. Pain due to onychomycosis of toenails of both feet   2. Callus   3. Type 2 diabetes mellitus with diabetic nephropathy, without long-term current use of insulin (HCC)   Consent given for treatment. Patient examined. All patient's and/or POA's questions/concerns addressed on today's visit. Mycotic toenails 1-5 debrided in length and girth without incident. Callus(es) submet head 5 b/l pared with sharp debridement without incident.Continue soft, supportive shoe gear daily. Report any pedal injuries to medical professional. Call office if there are any quesitons/concerns. -Patient/POA to call should there be question/concern in the interim.   Return in about 3 months (around 03/17/2024).  Freddie Breech, DPM      [facility] LOCATION: [address] [place] [address]                   Office [phone]   [facility] LOCATION: [address] [place], [place] [address] Office [phone]

## 2023-12-30 ENCOUNTER — Ambulatory Visit (INDEPENDENT_AMBULATORY_CARE_PROVIDER_SITE_OTHER): Payer: 59 | Admitting: Internal Medicine

## 2023-12-30 ENCOUNTER — Encounter: Payer: Self-pay | Admitting: Internal Medicine

## 2023-12-30 VITALS — BP 122/70 | HR 66 | Ht 69.0 in | Wt 214.2 lb

## 2023-12-30 DIAGNOSIS — Z7984 Long term (current) use of oral hypoglycemic drugs: Secondary | ICD-10-CM | POA: Diagnosis not present

## 2023-12-30 DIAGNOSIS — E1121 Type 2 diabetes mellitus with diabetic nephropathy: Secondary | ICD-10-CM

## 2023-12-30 DIAGNOSIS — E785 Hyperlipidemia, unspecified: Secondary | ICD-10-CM

## 2023-12-30 DIAGNOSIS — E66811 Obesity, class 1: Secondary | ICD-10-CM | POA: Diagnosis not present

## 2023-12-30 LAB — POCT GLYCOSYLATED HEMOGLOBIN (HGB A1C): Hemoglobin A1C: 7.1 % — AB (ref 4.0–5.6)

## 2023-12-30 MED ORDER — DAPAGLIFLOZIN PROPANEDIOL 10 MG PO TABS: 10.0000 mg | ORAL_TABLET | Freq: Every day | ORAL | 3 refills | Status: AC

## 2023-12-30 NOTE — Patient Instructions (Signed)
 Patient Instructions  Please continue: - Metformin 1000 mg 2x a day with meals - Farxiga 10 mg before b'fast - Rybelsus 7 mg before b'fast  Please return in 6 months with your sugar log.
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
## 2023-12-31 ENCOUNTER — Encounter: Payer: Self-pay | Admitting: Internal Medicine

## 2023-12-31 LAB — MICROALBUMIN / CREATININE URINE RATIO
Creatinine, Urine: 61 mg/dL (ref 20–320)
Microalb Creat Ratio: 49 mg/g{creat} — ABNORMAL HIGH (ref <30)
Microalb, Ur: 3 mg/dL

## 2024-01-15 ENCOUNTER — Other Ambulatory Visit: Payer: Self-pay | Admitting: Adult Health

## 2024-02-13 ENCOUNTER — Other Ambulatory Visit: Payer: Self-pay | Admitting: Internal Medicine

## 2024-02-13 DIAGNOSIS — E1121 Type 2 diabetes mellitus with diabetic nephropathy: Secondary | ICD-10-CM

## 2024-03-28 ENCOUNTER — Other Ambulatory Visit: Payer: Self-pay | Admitting: Internal Medicine

## 2024-03-28 DIAGNOSIS — E1121 Type 2 diabetes mellitus with diabetic nephropathy: Secondary | ICD-10-CM

## 2024-03-31 ENCOUNTER — Encounter: Payer: Self-pay | Admitting: Podiatry

## 2024-03-31 ENCOUNTER — Ambulatory Visit (INDEPENDENT_AMBULATORY_CARE_PROVIDER_SITE_OTHER): Admitting: Podiatry

## 2024-03-31 DIAGNOSIS — M79674 Pain in right toe(s): Secondary | ICD-10-CM

## 2024-03-31 DIAGNOSIS — B351 Tinea unguium: Secondary | ICD-10-CM

## 2024-03-31 DIAGNOSIS — L84 Corns and callosities: Secondary | ICD-10-CM

## 2024-03-31 DIAGNOSIS — M79675 Pain in left toe(s): Secondary | ICD-10-CM

## 2024-03-31 DIAGNOSIS — E1121 Type 2 diabetes mellitus with diabetic nephropathy: Secondary | ICD-10-CM

## 2024-03-31 NOTE — Progress Notes (Signed)
  Subjective:  Patient ID: Billy Hansen, male    DOB: 18-Apr-1946,  MRN: [id number]  78 y.o. male presents at risk foot care. Pt has h/o NIDDM with chronic kidney disease and callus(es) of both feet and painful mycotic toenails that are difficult to trim. Painful toenails interfere with ambulation. Aggravating factors include wearing enclosed shoe gear. Pain is relieved with periodic professional debridement. Painful calluses are aggravated when weightbearing with and without shoegear. Pain is relieved with periodic professional debridement.  He states he has occasional pain on the plantar aspect of both feet.  Chief Complaint  Patient presents with   Diabetes    NIDDM A1C 7.1. DFC toenail trim. LOV with PCP 12/2023.   PCP is Merna Huxley, NP , and last visit was September 03, 2023.  Allergies  Allergen Reactions   Januvia  [Sitagliptin ] Other (See Comments)    HA, fatigue   Naproxen Sodium Itching   Other     Some type of anesthetic following colonoscopy/ notes states Propofol    Review of Systems: Negative except as noted in the HPI.   Objective:  Billy Hansen is a pleasant 78 y.o. male in NAD. AAO x 3.  Vascular Examination: Vascular status intact b/l with palpable pedal pulses. CFT immediate b/l. Pedal hair present. No edema. No pain with calf compression b/l. Skin temperature gradient WNL b/l. No varicosities noted. No cyanosis or clubbing noted.  Neurological Examination: Sensation grossly intact b/l with 10 gram monofilament.  Dermatological Examination: Pedal skin with normal turgor, texture and tone b/l. No open wounds nor interdigital macerations noted. Toenails 1-5 b/l thick, discolored, elongated with subungual debris and pain on dorsal palpation. No hyperkeratotic nor porokeratotic lesions present on today's visit.  Musculoskeletal Examination: Muscle strength 5/5 to b/l LE.  No pain, crepitus noted b/l. No gross pedal deformities. Patient ambulates  independently without assistive aids.   Radiographs: None Last A1c:      Latest Ref Rng & Units 12/30/2023    8:38 AM  Hemoglobin A1C  Hemoglobin-A1c 4.0 - 5.6 % 7.1      Assessment:   1. Pain due to onychomycosis of toenails of both feet   2. Type 2 diabetes mellitus with diabetic nephropathy, without long-term current use of insulin  (HCC)    Plan:  Patient was evaluated and treated. All patient's and/or POA's questions/concerns addressed on today's visit. Toenails 1-5 debrided in length and girth without incident. Continue foot and shoe inspections daily. Monitor blood glucose per PCP/Endocrinologist's recommendations. Continue soft, supportive shoe gear daily. Report any pedal injuries to medical professional. Call office if there are any questions/concerns. -Shoe recommendations for [employer], [employer] or [employer]. -Patient/POA to call should there be question/concern in the interim.  Return in about 3 months (around 07/01/2024).  Billy Hansen, DPM      [facility] LOCATION: [address], [place] [address]                   Office [phone]   [facility] LOCATION: [address] [place], [place] [address] Office [phone]

## 2024-04-05 ENCOUNTER — Encounter: Payer: Self-pay | Admitting: Podiatry

## 2024-04-09 ENCOUNTER — Ambulatory Visit: Admitting: Adult Health

## 2024-04-09 ENCOUNTER — Encounter: Payer: Self-pay | Admitting: Adult Health

## 2024-04-09 VITALS — BP 130/80 | HR 62 | Temp 97.8°F | Ht 69.0 in | Wt 217.0 lb

## 2024-04-09 DIAGNOSIS — K219 Gastro-esophageal reflux disease without esophagitis: Secondary | ICD-10-CM

## 2024-04-09 DIAGNOSIS — Z125 Encounter for screening for malignant neoplasm of prostate: Secondary | ICD-10-CM

## 2024-04-09 DIAGNOSIS — Z0000 Encounter for general adult medical examination without abnormal findings: Secondary | ICD-10-CM

## 2024-04-09 DIAGNOSIS — M25511 Pain in right shoulder: Secondary | ICD-10-CM

## 2024-04-09 DIAGNOSIS — I10 Essential (primary) hypertension: Secondary | ICD-10-CM | POA: Diagnosis not present

## 2024-04-09 DIAGNOSIS — E785 Hyperlipidemia, unspecified: Secondary | ICD-10-CM | POA: Diagnosis not present

## 2024-04-09 DIAGNOSIS — G8929 Other chronic pain: Secondary | ICD-10-CM | POA: Diagnosis not present

## 2024-04-09 DIAGNOSIS — N183 Chronic kidney disease, stage 3 unspecified: Secondary | ICD-10-CM

## 2024-04-09 DIAGNOSIS — E1122 Type 2 diabetes mellitus with diabetic chronic kidney disease: Secondary | ICD-10-CM

## 2024-04-09 DIAGNOSIS — E66811 Obesity, class 1: Secondary | ICD-10-CM

## 2024-04-09 LAB — CBC
HCT: 47.5 % (ref 39.0–52.0)
Hemoglobin: 15.6 g/dL (ref 13.0–17.0)
MCHC: 32.7 g/dL (ref 30.0–36.0)
MCV: 87 fl (ref 78.0–100.0)
Platelets: 125 K/uL — ABNORMAL LOW (ref 150.0–400.0)
RBC: 5.46 Mil/uL (ref 4.22–5.81)
RDW: 14 % (ref 11.5–15.5)
WBC: 4.1 K/uL (ref 4.0–10.5)

## 2024-04-09 LAB — COMPREHENSIVE METABOLIC PANEL WITH GFR
ALT: 18 U/L (ref 0–53)
AST: 17 U/L (ref 0–37)
Albumin: 4.8 g/dL (ref 3.5–5.2)
Alkaline Phosphatase: 80 U/L (ref 39–117)
BUN: 13 mg/dL (ref 6–23)
CO2: 31 meq/L (ref 19–32)
Calcium: 9.7 mg/dL (ref 8.4–10.5)
Chloride: 103 meq/L (ref 96–112)
Creatinine, Ser: 1.04 mg/dL (ref 0.40–1.50)
GFR: 69.79 mL/min (ref >60.00)
Glucose, Bld: 138 mg/dL — ABNORMAL HIGH (ref 70–99)
Potassium: 4.3 meq/L (ref 3.5–5.1)
Sodium: 143 meq/L (ref 135–145)
Total Bilirubin: 1.5 mg/dL — ABNORMAL HIGH (ref 0.2–1.2)
Total Protein: 7 g/dL (ref 6.0–8.3)

## 2024-04-09 LAB — LIPID PANEL
Cholesterol: 124 mg/dL (ref 0–200)
HDL: 51.6 mg/dL (ref >39.00)
LDL Cholesterol: 42 mg/dL (ref 0–99)
NonHDL: 72.44
Total CHOL/HDL Ratio: 2
Triglycerides: 153 mg/dL — ABNORMAL HIGH (ref 0.0–149.0)
VLDL: 30.6 mg/dL (ref 0.0–40.0)

## 2024-04-09 LAB — PSA: PSA: 3.03 ng/mL (ref 0.10–4.00)

## 2024-04-09 LAB — TSH: TSH: 0.87 u[IU]/mL (ref 0.35–5.50)

## 2024-04-09 NOTE — Patient Instructions (Signed)
 It was great seeing you today   We will follow up with you regarding your lab work   Please let me know if you need anything

## 2024-04-09 NOTE — Progress Notes (Signed)
 Subjective:    Patient ID: Billy Hansen, male    DOB: [birth year], 78 y.o.   MRN: [id number]  HPI Patient presents for yearly preventative medicine examination. He is a pleasant 78 year old male who  has a past medical history of Allergy, Arthritis, Diabetes mellitus without complication (HCC), GERD (gastroesophageal reflux disease), Hyperlipidemia, and Hypertension.  DM Type 2 - managed by endocrinology.  He is currently prescribed Farxiga  10 mg daily, Rybelsus  7 mg in the a.m. and metformin  1000 mg twice daily.  He does check his blood sugars at home with readings mostly in the 140s in the morning but improves later on in the day.  His last A1c was 7.5 in June 2023 Lab Results  Component Value Date   HGBA1C 7.1 (A) 12/30/2023   HGBA1C 7.7 (A) 12/26/2022   HGBA1C 8.0 (A) 06/26/2022   Hypertension -controlled with Lasix  20 mg daily and lisinopril  40 mg daily.  He denies chest pain, shortness of breath, headache, dizziness, blurred vision, or syncopal episodes BP Readings from Last 3 Encounters:  04/09/24 130/80  12/30/23 122/70  09/03/23 128/80   Hyperlipidemia -takes Crestor   40 mg daily. He denies myalgia or fatigue  Lab Results  Component Value Date   CHOL 115 04/18/2023   HDL 49.90 04/18/2023   LDLCALC 43 04/18/2023   LDLDIRECT 71.0 04/05/2022   TRIG 107.0 04/18/2023   CHOLHDL 2 04/18/2023   Erectile dysfunction-uses Viagra  as needed  GERD-controlled with Prilosec 20 mg daily.  Chronic right shoulder pain-he has known history of right shoulder pain, dating back to 2018 where he injured his shoulder throwing a heavy object into the back of a truck.  He has been seen by Dr. Vernetta back in 2019 at which time had an x-ray done showed minimal arthritic changes.  He did receive a steroid injection at some point in time which helped with his discomfort.  Today he reports that he continues to have right shoulder pain and when he does internal or external rotations the shoulder  will pop.   All immunizations and health maintenance protocols were reviewed with the patient and needed orders were placed.  Appropriate screening laboratory values were ordered for the patient including screening of hyperlipidemia, renal function and hepatic function. If indicated by BPH, a PSA was ordered.  Medication reconciliation,  past medical history, social history, problem list and allergies were reviewed in detail with the patient  Goals were established with regard to weight loss, exercise, and  diet in compliance with medications Wt Readings from Last 10 Encounters:  04/09/24 217 lb (98.4 kg)  12/30/23 214 lb 3.2 oz (97.2 kg)  12/17/23 219 lb (99.3 kg)  09/03/23 219 lb (99.3 kg)  07/01/23 220 lb 3.2 oz (99.9 kg)  06/07/23 217 lb 1.6 oz (98.5 kg)  04/09/23 221 lb (100.2 kg)  01/29/23 222 lb (100.7 kg)  01/13/23 224 lb (101.6 kg)  12/26/22 224 lb 9.6 oz (101.9 kg)   Review of Systems  Constitutional: Negative.   HENT: Negative.    Eyes: Negative.   Respiratory: Negative.    Cardiovascular: Negative.   Gastrointestinal: Negative.   Endocrine: Negative.   Genitourinary: Negative.   Musculoskeletal:  Positive for arthralgias and gait problem.  Skin: Negative.   Allergic/Immunologic: Negative.   Hematological: Negative.   Psychiatric/Behavioral: Negative.    All other systems reviewed and are negative.  Past Medical History:  Diagnosis Date   Allergy    Arthritis    Diabetes mellitus  without complication (HCC)    GERD (gastroesophageal reflux disease)    Hyperlipidemia    Hypertension     Social History   Socioeconomic History   Marital status: Married    Spouse name: Not on file   Number of children: Not on file   Years of education: Not on file   Highest education level: Not on file  Occupational History   Not on file  Tobacco Use   Smoking status: Never   Smokeless tobacco: Never  Vaping Use   Vaping status: Never Used  Substance and Sexual  Activity   Alcohol use: No   Drug use: No   Sexual activity: Yes    Partners: Female  Other Topics Concern   Not on file  Social History Narrative   Regular exercise: walk   Caffeine use: 1 cup of coffee         Social Drivers of Corporate investment banker Strain: Low Risk  (06/07/2023)   Overall Financial Resource Strain (CARDIA)    Difficulty of Paying Living Expenses: Not hard at all  Food Insecurity: No Food Insecurity (06/07/2023)   Hunger Vital Sign    Worried About Running Out of Food in the Last Year: Never true    Ran Out of Food in the Last Year: Never true  Transportation Needs: No Transportation Needs (06/07/2023)   PRAPARE - Administrator, Civil Service (Medical): No    Lack of Transportation (Non-Medical): No  Physical Activity: Insufficiently Active (05/29/2022)   Exercise Vital Sign    Days of Exercise per Week: 5 days    Minutes of Exercise per Session: 20 min  Stress: No Stress Concern Present (06/07/2023)   [employer] of Occupational Health - Occupational Stress Questionnaire    Feeling of Stress : Not at all  Social Connections: Moderately Integrated (06/07/2023)   Social Connection and Isolation Panel    Frequency of Communication with Friends and Family: More than three times a week    Frequency of Social Gatherings with Friends and Family: More than three times a week    Attends Religious Services: More than 4 times per year    Active Member of [employer] or Organizations: Yes    Attends Banker Meetings: More than 4 times per year    Marital Status: Divorced  Intimate Partner Violence: Not At Risk (06/07/2023)   Humiliation, Afraid, Rape, and Kick questionnaire    Fear of Current or Ex-Partner: No    Emotionally Abused: No    Physically Abused: No    Sexually Abused: No    Past Surgical History:  Procedure Laterality Date   COLON SURGERY  2005   due to injury at work per pt.    COLON SURGERY  2006   revision of ostomy  bag   COLONOSCOPY  2006, 2017   KNEE SURGERY     right knee    Family History  Problem Relation Age of Onset   Cancer Mother    Diabetes Brother    Diabetes Daughter    Early death Daughter    Colon cancer Neg Hx     Allergies  Allergen Reactions   Januvia  [Sitagliptin ] Other (See Comments)    HA, fatigue   Naproxen Sodium Itching   Other     Some type of anesthetic following colonoscopy/ notes states Propofol    Current Outpatient Medications on File Prior to Visit  Medication Sig Dispense Refill   Accu-Chek Softclix Lancets  lancets 1 each by Other route 3 (three) times daily. E11.9 100 each 2   aspirin 81 MG tablet Take 81 mg by mouth daily.     clotrimazole -betamethasone  (LOTRISONE ) cream Apply to feet and between toes bid x 6 weeks. 45 g 0   cyclobenzaprine  (FLEXERIL ) 10 MG tablet Take 1 tablet (10 mg total) by mouth at bedtime. 15 tablet 0   dapagliflozin  propanediol (FARXIGA ) 10 MG TABS tablet Take 1 tablet (10 mg total) by mouth daily before breakfast. 100 tablet 3   lisinopril  (ZESTRIL ) 20 MG tablet TAKE 1 TABLET BY MOUTH TWICE  DAILY 200 tablet 2   metFORMIN  (GLUCOPHAGE ) 1000 MG tablet TAKE 1 TABLET BY MOUTH TWICE  DAILY WITH MEALS 200 tablet 2   rosuvastatin  (CRESTOR ) 40 MG tablet TAKE 1 TABLET BY MOUTH DAILY 100 tablet 2   RYBELSUS  7 MG TABS TAKE 1 TABLET BY MOUTH DAILY 90 tablet 3   Blood Glucose Monitoring Suppl (ACCU-CHEK GUIDE) w/Device KIT 1 each by Does not apply route 3 (three) times daily. E11.9 1 kit 0   glucose blood (ACCU-CHEK GUIDE TEST) test strip CHECK BLOOD SUGAR ONCE DAILY 100 strip 3   No current facility-administered medications on file prior to visit.    BP 130/80   Pulse 62   Temp 97.8 F (36.6 C) (Oral)   Ht 5' 9 (1.753 m)   Wt 217 lb (98.4 kg)   SpO2 98%   BMI 32.05 kg/m       Objective:   Physical Exam Vitals and nursing note reviewed.  Constitutional:      General: He is not in acute distress.    Appearance: Normal  appearance. He is obese. He is not ill-appearing.  HENT:     Head: Normocephalic and atraumatic.     Right Ear: Tympanic membrane, ear canal and external ear normal. There is no impacted cerumen.     Left Ear: Tympanic membrane, ear canal and external ear normal. There is no impacted cerumen.     Nose: Nose normal. No congestion or rhinorrhea.     Mouth/Throat:     Mouth: Mucous membranes are moist.     Pharynx: Oropharynx is clear.  Eyes:     Extraocular Movements: Extraocular movements intact.     Conjunctiva/sclera: Conjunctivae normal.     Pupils: Pupils are equal, round, and reactive to light.  Neck:     Vascular: No carotid bruit.  Cardiovascular:     Rate and Rhythm: Normal rate and regular rhythm.     Pulses: Normal pulses.     Heart sounds: No murmur heard.    No friction rub. No gallop.  Pulmonary:     Effort: Pulmonary effort is normal.     Breath sounds: Normal breath sounds.  Abdominal:     General: Abdomen is flat. Bowel sounds are normal. There is no distension.     Palpations: Abdomen is soft. There is no mass.     Tenderness: There is no abdominal tenderness. There is no guarding or rebound.     Hernia: No hernia is present.  Musculoskeletal:        General: Normal range of motion.     Right shoulder: Bony tenderness and crepitus present. Normal strength.     Cervical back: Normal range of motion and neck supple.     Comments: He is able to perform back scratch test without difficulty.  Raising his arm above 90 degrees causes pain.  He does have a popping  sensation on the right shoulder with internal and external rotation.  Lymphadenopathy:     Cervical: No cervical adenopathy.  Skin:    General: Skin is warm and dry.     Capillary Refill: Capillary refill takes less than 2 seconds.  Neurological:     General: No focal deficit present.     Mental Status: He is alert and oriented to person, place, and time.     Gait: Gait abnormal (walking with cane).      Deep Tendon Reflexes: Reflexes normal.  Psychiatric:        Mood and Affect: Mood normal.        Behavior: Behavior normal.        Thought Content: Thought content normal.        Judgment: Judgment normal.        Assessment & Plan:  1. Routine general medical examination at a health care facility Today patient counseled on age appropriate routine health concerns for screening and prevention, each reviewed and up to date or declined. Immunizations reviewed and up to date or declined. Labs ordered and reviewed. Risk factors for depression reviewed and negative. Hearing function and visual acuity are intact. ADLs screened and addressed as needed. Functional ability and level of safety reviewed and appropriate. Education, counseling and referrals performed based on assessed risks today. Patient provided with a copy of personalized plan for preventive services. - Follow up in one year or sooner if needed - Work on weight loss through diet and exercise    2. Essential hypertension - Well controlled. No change in medication  - Lipid panel; Future - TSH; Future - CBC; Future - CMP; Future  3. Controlled type 2 diabetes mellitus with stage 3 chronic kidney disease, without long-term current use of insulin  (HCC) - Per endocrinology  - Lipid panel; Future - TSH; Future - CBC; Future - CMP; Future - Microalbumin/Creatinine Ratio, Urine; Future  4. Hyperlipidemia, unspecified hyperlipidemia type - Continue statin  - Lipid panel; Future - TSH; Future - CBC; Future - PSA; Future  5. Gastroesophageal reflux disease, unspecified whether esophagitis present - Continue PPI  - Lipid panel; Future - TSH; Future - CBC; Future - CMP; Future  6. Prostate cancer screening - PSA; Future  7. Obesity (BMI 30.0-34.9) - Work on weight loss through diet and exercise  - Lipid panel; Future - TSH; Future - CBC; Future - CMP; Future  8. Chronic right shoulder pain - Will repeat xray and likely  need MRI  - Can consider another steroid injection  - DG Shoulder Right; Future   Darleene Shape, NP

## 2024-04-10 ENCOUNTER — Ambulatory Visit: Payer: Self-pay | Admitting: Adult Health

## 2024-04-13 ENCOUNTER — Ambulatory Visit

## 2024-04-13 ENCOUNTER — Other Ambulatory Visit

## 2024-04-13 DIAGNOSIS — M25511 Pain in right shoulder: Secondary | ICD-10-CM | POA: Diagnosis not present

## 2024-04-13 DIAGNOSIS — G8929 Other chronic pain: Secondary | ICD-10-CM | POA: Diagnosis not present

## 2024-04-20 ENCOUNTER — Encounter: Payer: Self-pay | Admitting: Adult Health

## 2024-04-21 ENCOUNTER — Other Ambulatory Visit: Payer: Self-pay | Admitting: Adult Health

## 2024-04-21 DIAGNOSIS — G8929 Other chronic pain: Secondary | ICD-10-CM

## 2024-04-21 NOTE — Telephone Encounter (Signed)
 FYI

## 2024-05-06 ENCOUNTER — Ambulatory Visit
Admission: RE | Admit: 2024-05-06 | Discharge: 2024-05-06 | Disposition: A | Discharge status: Home / Self Care | Source: Ambulatory Visit | Attending: Adult Health | Admitting: Adult Health

## 2024-05-06 DIAGNOSIS — S46011A Strain of muscle(s) and tendon(s) of the rotator cuff of right shoulder, initial encounter: Secondary | ICD-10-CM | POA: Diagnosis not present

## 2024-05-06 DIAGNOSIS — G8929 Other chronic pain: Secondary | ICD-10-CM

## 2024-05-10 ENCOUNTER — Ambulatory Visit: Payer: Self-pay | Admitting: Adult Health

## 2024-05-10 DIAGNOSIS — S46019A Strain of muscle(s) and tendon(s) of the rotator cuff of unspecified shoulder, initial encounter: Secondary | ICD-10-CM

## 2024-05-13 NOTE — Progress Notes (Signed)
 Referral placed for ortho

## 2024-05-24 ENCOUNTER — Other Ambulatory Visit: Payer: Self-pay | Admitting: Adult Health

## 2024-05-24 DIAGNOSIS — I10 Essential (primary) hypertension: Secondary | ICD-10-CM

## 2024-05-27 ENCOUNTER — Telehealth: Payer: Self-pay

## 2024-05-27 NOTE — Telephone Encounter (Signed)
 Copied from CRM [id number]. Topic: Clinical - Prescription Issue >> May 27, 2024  9:18 AM Robinson H wrote: Reason for CRM: Patient is following up on refill request sent in by his pharmacy for his lisinopril  (ZESTRIL ) 20 MG tablet please reach out to patient.  Billy Hansen [phone]

## 2024-05-27 NOTE — Telephone Encounter (Signed)
 Medication refilled

## 2024-06-04 ENCOUNTER — Ambulatory Visit (INDEPENDENT_AMBULATORY_CARE_PROVIDER_SITE_OTHER): Admitting: Orthopedic Surgery

## 2024-06-04 ENCOUNTER — Other Ambulatory Visit: Payer: Self-pay

## 2024-06-04 ENCOUNTER — Encounter: Payer: Self-pay | Admitting: Orthopedic Surgery

## 2024-06-04 DIAGNOSIS — M75121 Complete rotator cuff tear or rupture of right shoulder, not specified as traumatic: Secondary | ICD-10-CM | POA: Diagnosis not present

## 2024-06-04 DIAGNOSIS — M25511 Pain in right shoulder: Secondary | ICD-10-CM

## 2024-06-04 NOTE — Progress Notes (Unsigned)
 Office Visit Note   Patient: Billy Hansen           Date of Birth: 1945-12-25           MRN: [id number] Visit Date: 06/04/2024 Requested by: Merna Huxley, NP [address] [place],  [place] [address] PCP: Merna Huxley, NP  Subjective: Chief Complaint  Patient presents with   Right Shoulder - Pain    HPI: Billy Hansen is a 78 y.o. male who presents to the office reporting right shoulder pain which has been severe over the past several months.  Patient had an injury in 2002 where he had his arm up to catch something and he has has had pain since then.  The pain wakes him from sleep at night.  He is right-hand dominant.  He has been getting injections about 2/year.  He does have diabetes which is well-controlled.  Does describe some popping and grinding.  Hard for him to pick things up.  Tylenol  helps some.  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  Last injection was 6 months ago.              ROS: All systems reviewed are negative as they relate to the chief complaint within the history of present illness.  Patient denies fevers or chills.  Assessment & Plan: Visit Diagnoses:  1. Right shoulder pain, unspecified chronicity     Plan: Impression is right shoulder rotator cuff tear which is about a centimeter and a half with some retraction.  No muscle atrophy is present.  Plan at this time is intra-articular right shoulder injection.  We talked about further treatment including operative fixation of the rotator cuff tear but he wants to avoid that if possible.  I do not think 2 injections a year would be necessarily detrimental in terms of cartilage wear.  Since he is fairly opposed to surgical intervention and is reasonably functional at this time we will do an ultrasound-guided injection today.  He will follow-up with us  for possible another injection in 6 months.  If he changes his mind about surgical intervention that something we could consider as well  but for now he is reasonably well compensated with his rotator cuff tear which would not be that uncommon for somebody in his age group.  Follow-Up Instructions: No follow-ups on file.   Orders:  Orders Placed This Encounter  Procedures   US  Guided Needle Placement - No Linked Charges   No orders of the defined types were placed in this encounter.     Procedures: Large Joint Inj: R glenohumeral on 06/04/2024 5:24 PM Indications: diagnostic evaluation and pain Details: 18 G 1.5 in needle, posterior approach  Arthrogram: No  Medications: 9 mL bupivacaine  0.5 %; 5 mL lidocaine  1 %; 40 mg triamcinolone  acetonide 40 MG/ML Outcome: tolerated well, no immediate complications Procedure, treatment alternatives, risks and benefits explained, specific risks discussed. Consent was given by the patient. Immediately prior to procedure a time out was called to verify the correct patient, procedure, equipment, support staff and site/side marked as required. Patient was prepped and draped in the usual sterile fashion.       Clinical Data: No additional findings.  Objective: Vital Signs: There were no vitals taken for this visit.  Physical Exam:  Constitutional: Patient appears well-developed HEENT:  Head: Normocephalic Eyes:EOM are normal Neck: Normal range of motion Cardiovascular: Normal rate Pulmonary/chest: Effort normal Neurologic: Patient is alert Skin: Skin  is warm Psychiatric: Patient has normal mood and affect  Ortho Exam: Ortho exam demonstrates 5- out of 5 external rotation strength on the right compared to 5+ out of 5 on the left.  Subscap strength 5+ out of 5 bilaterally.  No biceps tenderness.  Does have a little bit of popping and crepitus with passive range of motion at 90 degrees of abduction on the right not on the left.  No discrete AC joint tenderness on the right.  Negative O'Brien's testing on the left equivocal on the right.  Has symmetric range of motion of  60/95/165 bilaterally.  Specialty Comments:  No specialty comments available.  Imaging: US  Guided Needle Placement - No Linked Charges Result Date: 06/04/2024 Ultrasound imaging demonstrates needle placement into the glenohumeral joint with injection of fluid into the joint and no complicating features.     PMFS History: Patient Active Problem List   Diagnosis Date Noted   Presbycusis of both ears 06/08/2022   Impingement syndrome of right shoulder 03/31/2018   Obesity (BMI 30.0-34.9) 08/02/2017   Type 2 diabetes mellitus with diabetic nephropathy, without long-term current use of insulin  (HCC) 10/14/2015   Enlarged thyroid  gland 08/09/2015   CKD stage 2 due to type 2 diabetes mellitus (HCC) 10/12/2014   ERECTILE DYSFUNCTION, ORGANIC 04/07/2007   Hyperlipidemia 03/13/2007   Essential hypertension, benign 03/13/2007   GERD 03/13/2007   Past Medical History:  Diagnosis Date   Allergy    Arthritis    Diabetes mellitus without complication (HCC)    GERD (gastroesophageal reflux disease)    Hyperlipidemia    Hypertension     Family History  Problem Relation Age of Onset   Cancer Mother    Diabetes Brother    Diabetes Daughter    Early death Daughter    Colon cancer Neg Hx     Past Surgical History:  Procedure Laterality Date   COLON SURGERY  2005   due to injury at work per pt.    COLON SURGERY  2006   revision of ostomy bag   COLONOSCOPY  2006, 2017   KNEE SURGERY     right knee   Social History   Occupational History   Not on file  Tobacco Use   Smoking status: Never   Smokeless tobacco: Never  Vaping Use   Vaping status: Never Used  Substance and Sexual Activity   Alcohol use: No   Drug use: No   Sexual activity: Yes    Partners: Female

## 2024-06-05 MED ORDER — BUPIVACAINE HCL 0.5 % IJ SOLN
9.0000 mL | INTRAMUSCULAR | Status: AC | PRN
  Administered 2024-06-04: 9 mL via INTRA_ARTICULAR

## 2024-06-05 MED ORDER — TRIAMCINOLONE ACETONIDE 40 MG/ML IJ SUSP
40.0000 mg | INTRAMUSCULAR | Status: AC | PRN
  Administered 2024-06-04: 40 mg via INTRA_ARTICULAR

## 2024-06-05 MED ORDER — LIDOCAINE HCL 1 % IJ SOLN
5.0000 mL | INTRAMUSCULAR | Status: AC | PRN
  Administered 2024-06-04: 5 mL

## 2024-06-10 ENCOUNTER — Ambulatory Visit (INDEPENDENT_AMBULATORY_CARE_PROVIDER_SITE_OTHER): Payer: 59

## 2024-06-10 VITALS — BP 130/62 | HR 78 | Temp 98.2°F | Ht 69.0 in | Wt 216.2 lb

## 2024-06-10 DIAGNOSIS — Z0000 Encounter for general adult medical examination without abnormal findings: Secondary | ICD-10-CM | POA: Diagnosis not present

## 2024-06-10 NOTE — Progress Notes (Signed)
 Subjective:   Billy Hansen is a 78 y.o. who presents for a Medicare Wellness preventive visit.  As a reminder, Annual Wellness Visits don't include a physical exam, and some assessments may be limited, especially if this visit is performed virtually. We may recommend an in-person follow-up visit with your provider if needed.  Visit Complete: In person    Persons Participating in Visit: Patient.  AWV Questionnaire: No: Patient Medicare AWV questionnaire was not completed prior to this visit.  Cardiac Risk Factors include: advanced age (>46men, >23 women);male gender;diabetes mellitus;hypertension     Objective:    Today's Vitals   06/10/24 1540  BP: 130/62  Pulse: 78  Temp: 98.2 F (36.8 C)  TempSrc: Oral  SpO2: 97%  Weight: 216 lb 3.2 oz (98.1 kg)  Height: 5' 9 (1.753 m)   Body mass index is 31.93 kg/m.     06/10/2024    3:50 PM 06/07/2023    1:55 PM 05/29/2022    2:03 PM 05/25/2021    2:41 PM 05/13/2020    9:51 AM 04/28/2018    8:31 AM 01/18/2016    7:51 AM  Advanced Directives  Does Patient Have a Medical Advance Directive? No No Yes No No No  No   Type of Advance Directive   Living will      Would patient like information on creating a medical advance directive? No - Patient declined No - Patient declined  No - Patient declined No - Patient declined       Data saved with a previous flowsheet row definition    Current Medications (verified) Outpatient Encounter Medications as of 06/10/2024  Medication Sig   Accu-Chek Softclix Lancets lancets 1 each by Other route 3 (three) times daily. E11.9   aspirin 81 MG tablet Take 81 mg by mouth daily.   Blood Glucose Monitoring Suppl (ACCU-CHEK GUIDE) w/Device KIT 1 each by Does not apply route 3 (three) times daily. E11.9   clotrimazole -betamethasone  (LOTRISONE ) cream Apply to feet and between toes bid x 6 weeks.   cyclobenzaprine  (FLEXERIL ) 10 MG tablet Take 1 tablet (10 mg total) by mouth at bedtime.   dapagliflozin   propanediol (FARXIGA ) 10 MG TABS tablet Take 1 tablet (10 mg total) by mouth daily before breakfast.   glucose blood (ACCU-CHEK GUIDE TEST) test strip CHECK BLOOD SUGAR ONCE DAILY   lisinopril  (ZESTRIL ) 20 MG tablet TAKE 1 TABLET BY MOUTH TWICE  DAILY   metFORMIN  (GLUCOPHAGE ) 1000 MG tablet TAKE 1 TABLET BY MOUTH TWICE  DAILY WITH MEALS   rosuvastatin  (CRESTOR ) 40 MG tablet TAKE 1 TABLET BY MOUTH DAILY   RYBELSUS  7 MG TABS TAKE 1 TABLET BY MOUTH DAILY   No facility-administered encounter medications on file as of 06/10/2024.    Allergies (verified) Januvia  [sitagliptin ], Naproxen sodium, and Other   History: Past Medical History:  Diagnosis Date   Allergy    Arthritis    Diabetes mellitus without complication (HCC)    GERD (gastroesophageal reflux disease)    Hyperlipidemia    Hypertension    Past Surgical History:  Procedure Laterality Date   COLON SURGERY  2005   due to injury at work per pt.    COLON SURGERY  2006   revision of ostomy bag   COLONOSCOPY  2006, 2017   KNEE SURGERY     right knee   Family History  Problem Relation Age of Onset   Cancer Mother    Diabetes Brother    Diabetes Daughter  Early death Daughter    Colon cancer Neg Hx    Social History   Socioeconomic History   Marital status: Married    Spouse name: Not on file   Number of children: Not on file   Years of education: Not on file   Highest education level: Not on file  Occupational History   Not on file  Tobacco Use   Smoking status: Never   Smokeless tobacco: Never  Vaping Use   Vaping status: Never Used  Substance and Sexual Activity   Alcohol use: No   Drug use: No   Sexual activity: Yes    Partners: Female  Other Topics Concern   Not on file  Social History Narrative   Regular exercise: walk   Caffeine use: 1 cup of coffee         Social Drivers of Corporate investment banker Strain: Low Risk  (06/10/2024)   Overall Financial Resource Strain (CARDIA)    Difficulty  of Paying Living Expenses: Not hard at all  Food Insecurity: No Food Insecurity (06/10/2024)   Hunger Vital Sign    Worried About Running Out of Food in the Last Year: Never true    Ran Out of Food in the Last Year: Never true  Transportation Needs: No Transportation Needs (06/10/2024)   PRAPARE - Administrator, Civil Service (Medical): No    Lack of Transportation (Non-Medical): No  Physical Activity: Sufficiently Active (06/10/2024)   Exercise Vital Sign    Days of Exercise per Week: 5 days    Minutes of Exercise per Session: 30 min  Stress: No Stress Concern Present (06/10/2024)   [employer] of Occupational Health - Occupational Stress Questionnaire    Feeling of Stress: Not at all  Social Connections: Socially Integrated (06/10/2024)   Social Connection and Isolation Panel    Frequency of Communication with Friends and Family: More than three times a week    Frequency of Social Gatherings with Friends and Family: More than three times a week    Attends Religious Services: More than 4 times per year    Active Member of [employer] or Organizations: Yes    Attends Engineer, structural: More than 4 times per year    Marital Status: Married    Tobacco Counseling Counseling given: Not Answered    Clinical Intake:  Pre-visit preparation completed: Yes  Pain : No/denies pain     BMI - recorded: 31.93 Nutritional Status: BMI > 30  Obese Nutritional Risks: None Diabetes: Yes CBG done?: Yes (CBG 124 Per patient) CBG resulted in Enter/ Edit results?: Yes Did pt. bring in CBG monitor from home?: No  Lab Results  Component Value Date   HGBA1C 7.1 (A) 12/30/2023   HGBA1C 7.7 (A) 12/26/2022   HGBA1C 8.0 (A) 06/26/2022     How often do you need to have someone help you when you read instructions, pamphlets, or other written materials from your doctor or pharmacy?: 1 - Never  Interpreter Needed?: No  Information entered by :: Billy Blush  LPN   Activities of Daily Living     06/10/2024    3:48 PM 04/09/2024    9:16 AM  In your present state of health, do you have any difficulty performing the following activities:  Hearing? 0 0  Vision? 0 1  Comment  trouble seeing fine writing eye appt. soon per pt  Difficulty concentrating or making decisions? 0 0  Walking or climbing stairs? 1  1  Comment Uses a Cane   Dressing or bathing? 0 0  Doing errands, shopping? 0 0  Preparing Food and eating ? N   Using the Toilet? N   In the past six months, have you accidently leaked urine? N   Do you have problems with loss of bowel control? N   Managing your Medications? N   Managing your Finances? N   Housekeeping or managing your Housekeeping? N     Patient Care Team: Merna Huxley, NP as PCP - General (Family Medicine)  I have updated your Care Teams any recent Medical Services you may have received from other providers in the past year.     Assessment:   This is a routine wellness examination for Billy Hansen.  Hearing/Vision screen Hearing Screening - Comments:: Denies hearing difficulties   Vision Screening - Comments:: Wears rx glasses - up to date with routine eye exams with  Dr Charmayne   Goals Addressed               This Visit's Progress     Continue physical activity (pt-stated)        Remain active       Depression Screen     06/10/2024    3:27 PM 06/07/2023    1:51 PM 05/29/2022    2:06 PM 04/05/2022    8:52 AM 01/10/2022    9:53 AM 05/25/2021    2:42 PM 05/25/2021    2:38 PM  PHQ 2/9 Scores  PHQ - 2 Score 0 0 0 0 0 0 0    Fall Risk     06/10/2024    3:49 PM 04/09/2024    9:16 AM 06/07/2023    1:53 PM 04/09/2023    8:50 AM 05/29/2022    2:05 PM  Fall Risk   Falls in the past year? 1 1 1 1  0  Number falls in past yr: 0 0 0 0 0  Injury with Fall? 0 0 0 1 0  Comment Followed by medical attention      Risk for fall due to : No Fall Risks History of fall(s) No Fall Risks History of fall(s) Medication side  effect  Follow up Falls evaluation completed Falls evaluation completed Falls prevention discussed Falls evaluation completed Falls prevention discussed;Education provided;Falls evaluation completed      Data saved with a previous flowsheet row definition    MEDICARE RISK AT HOME:  Medicare Risk at Home Any stairs in or around the home?: No If so, are there any without handrails?: No Home free of loose throw rugs in walkways, pet beds, electrical cords, etc?: Yes Adequate lighting in your home to reduce risk of falls?: Yes Life alert?: No Use of a cane, walker or w/c?: Yes Grab bars in the bathroom?: Yes Shower chair or bench in shower?: No Elevated toilet seat or a handicapped toilet?: Yes  TIMED UP AND GO:  Was the test performed?  Yes  Length of time to ambulate 10 feet: 10 sec Gait slow and steady with assistive device  Cognitive Function: 6CIT completed    04/28/2018    8:51 AM  MMSE - Mini Mental State Exam  Not completed: --        06/10/2024    3:50 PM 06/07/2023    1:55 PM 05/29/2022    2:10 PM  6CIT Screen  What Year? 0 points 0 points 0 points  What month? 0 points 0 points 0 points  What time? 0 points  0 points 0 points  Count back from 20 0 points 0 points 0 points  Months in reverse 0 points 0 points 2 points  Repeat phrase 0 points 4 points 4 points  Total Score 0 points 4 points 6 points    Immunizations Immunization History  Administered Date(s) Administered   Fluad Quad(high Dose 65+) 08/05/2019, 06/15/2021, 05/29/2022, 05/29/2022   Fluad Trivalent(High Dose 65+) 07/01/2023   INFLUENZA, HIGH DOSE SEASONAL PF 07/18/2016, 09/30/2017   Influenza Whole 06/24/2006, 07/07/2007, 08/03/2009   Influenza,inj,Quad PF,6+ Mos 07/27/2013, 08/16/2014, 07/15/2015, 07/30/2018   PFIZER(Purple Top)SARS-COV-2 Vaccination 11/13/2019, 12/09/2019, 09/12/2020   Pneumococcal Conjugate-13 08/16/2014   Pneumococcal Polysaccharide-23 10/29/2012, 04/01/2020   Td 04/02/2006    Tdap 09/25/2016    Screening Tests Health Maintenance  Topic Date Due   OPHTHALMOLOGY EXAM  10/12/2023   Influenza Vaccine  04/17/2024   COVID-19 Vaccine (4 - 2025-26 season) 05/18/2024   Zoster Vaccines- Shingrix (1 of 2) 07/10/2024 (Originally 11/03/1997)   HEMOGLOBIN A1C  06/30/2024   FOOT EXAM  12/16/2024   Diabetic kidney evaluation - Urine ACR  12/29/2024   Diabetic kidney evaluation - eGFR measurement  04/09/2025   Medicare Annual Wellness (AWV)  06/10/2025   DTaP/Tdap/Td (3 - Td or Tdap) 09/25/2026   Pneumococcal Vaccine: 50+ Years  Completed   Hepatitis C Screening  Completed   HPV VACCINES  Aged Out   Meningococcal B Vaccine  Aged Out   Colonoscopy  Discontinued    Health Maintenance Items Addressed:   Additional Screening:  Vision Screening: Recommended annual ophthalmology exams for early detection of glaucoma and other disorders of the eye. Is the patient up to date with their annual eye exam?  Yes  Who is the provider or what is the name of the office in which the patient attends annual eye exams? Dr Charmayne  Dental Screening: Recommended annual dental exams for proper oral hygiene  Community Resource Referral / Chronic Care Management: CRR required this visit?  No   CCM required this visit?  No   Plan:    I have personally reviewed and noted the following in the patient's chart:   Medical and social history Use of alcohol, tobacco or illicit drugs  Current medications and supplements including opioid prescriptions. Patient is not currently taking opioid prescriptions. Functional ability and status Nutritional status Physical activity Advanced directives List of other physicians Hospitalizations, surgeries, and ER visits in previous 12 months Vitals Screenings to include cognitive, depression, and falls Referrals and appointments  In addition, I have reviewed and discussed with patient certain preventive protocols, quality metrics, and best  practice recommendations. A written personalized care plan for preventive services as well as general preventive health recommendations were provided to patient.   Billy LELON Blush, LPN   [id number]   After Visit Summary: (In Person-Printed) AVS printed and given to the patient  Notes: Nothing significant to report at this time.

## 2024-06-10 NOTE — Patient Instructions (Addendum)
 [name],  Thank you for taking the time for your Medicare Wellness Visit. I appreciate your continued commitment to your health goals. Please review the care plan we discussed, and feel free to reach out if I can assist you further.  Medicare recommends these wellness visits once per year to help you and your care team stay ahead of potential health issues. These visits are designed to focus on prevention, allowing your provider to concentrate on managing your acute and chronic conditions during your regular appointments.  Please note that Annual Wellness Visits do not include a physical exam. Some assessments may be limited, especially if the visit was conducted virtually. If needed, we may recommend a separate in-person follow-up with your provider.  Ongoing Care Seeing your primary care provider every 3 to 6 months helps us  monitor your health and provide consistent, personalized care.   Referrals If a referral was made during today's visit and you haven't received any updates within two weeks, please contact the referred provider directly to check on the status.  Recommended Screenings:  Health Maintenance  Topic Date Due   Eye exam for diabetics  10/12/2023   Flu Shot  04/17/2024   COVID-19 Vaccine (4 - 2025-26 season) 05/18/2024   Zoster (Shingles) Vaccine (1 of 2) 07/10/2024*   Hemoglobin A1C  06/30/2024   Complete foot exam   12/16/2024   Yearly kidney health urinalysis for diabetes  12/29/2024   Yearly kidney function blood test for diabetes  04/09/2025   Medicare Annual Wellness Visit  06/10/2025   DTaP/Tdap/Td vaccine (3 - Td or Tdap) 09/25/2026   Pneumococcal Vaccine for age over 45  Completed   Hepatitis C Screening  Completed   HPV Vaccine  Aged Out   Meningitis B Vaccine  Aged Out   Colon Cancer Screening  Discontinued  *Topic was postponed. The date shown is not the original due date.       06/10/2024    3:50 PM  Advanced Directives  Does Patient Have a  Medical Advance Directive? No  Would patient like information on creating a medical advance directive? No - Patient declined   Advance Care Planning is important because it: Ensures you receive medical care that aligns with your values, goals, and preferences. Provides guidance to your family and loved ones, reducing the emotional burden of decision-making during critical moments.  Vision: Annual vision screenings are recommended for early detection of glaucoma, cataracts, and diabetic retinopathy. These exams can also reveal signs of chronic conditions such as diabetes and high blood pressure.  Dental: Annual dental screenings help detect early signs of oral cancer, gum disease, and other conditions linked to overall health, including heart disease and diabetes.  Please see the attached documents for additional preventive care recommendations.

## 2024-07-01 ENCOUNTER — Ambulatory Visit: Admitting: Internal Medicine

## 2024-07-01 ENCOUNTER — Other Ambulatory Visit

## 2024-07-01 ENCOUNTER — Encounter: Payer: Self-pay | Admitting: Internal Medicine

## 2024-07-01 VITALS — BP 136/78 | HR 79 | Ht 69.0 in | Wt 217.8 lb

## 2024-07-01 DIAGNOSIS — E785 Hyperlipidemia, unspecified: Secondary | ICD-10-CM | POA: Diagnosis not present

## 2024-07-01 DIAGNOSIS — E66811 Obesity, class 1: Secondary | ICD-10-CM | POA: Diagnosis not present

## 2024-07-01 DIAGNOSIS — Z7984 Long term (current) use of oral hypoglycemic drugs: Secondary | ICD-10-CM

## 2024-07-01 DIAGNOSIS — E1121 Type 2 diabetes mellitus with diabetic nephropathy: Secondary | ICD-10-CM | POA: Diagnosis not present

## 2024-07-01 DIAGNOSIS — R42 Dizziness and giddiness: Secondary | ICD-10-CM

## 2024-07-01 LAB — POCT GLYCOSYLATED HEMOGLOBIN (HGB A1C): Hemoglobin A1C: 8.3 % — AB (ref 4.0–5.6)

## 2024-07-01 MED ORDER — FREESTYLE LIBRE 3 PLUS SENSOR MISC: 1.0000 | 3 refills | Status: AC

## 2024-07-01 MED ORDER — FREESTYLE LIBRE 3 READER DEVI: 1.0000 | Freq: Once | 0 refills | Status: AC

## 2024-07-01 NOTE — Addendum Note (Signed)
 Addended by: CLEOTILDE ROLIN RAMAN on: 07/01/2024 10:22 AM   Modules accepted: Orders
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
## 2024-07-01 NOTE — Patient Instructions (Signed)
 Patient Instructions  Please continue: - Metformin 1000 mg 2x a day with meals - Farxiga 10 mg before b'fast - Rybelsus 7 mg before b'fast  Please return in 6 months with your sugar log.

## 2024-07-02 ENCOUNTER — Encounter: Payer: Self-pay | Admitting: Internal Medicine

## 2024-07-03 ENCOUNTER — Other Ambulatory Visit (HOSPITAL_COMMUNITY): Payer: Self-pay

## 2024-07-03 ENCOUNTER — Telehealth: Payer: Self-pay

## 2024-07-03 NOTE — Telephone Encounter (Signed)
 Pt needs a Prior Auth for [place] 3 plus.

## 2024-07-04 ENCOUNTER — Telehealth: Payer: Self-pay

## 2024-07-04 LAB — MICROALBUMIN / CREATININE URINE RATIO
Creatinine, Urine: 59 mg/dL (ref 20–320)
Microalb Creat Ratio: 58 mg/g{creat} — ABNORMAL HIGH (ref <30)
Microalb, Ur: 3.4 mg/dL

## 2024-07-04 LAB — FRUCTOSAMINE: Fructosamine: 344 umol/L — ABNORMAL HIGH (ref 205–285)

## 2024-07-04 LAB — VITAMIN B12: Vitamin B-12: 321 pg/mL (ref 200–1100)

## 2024-07-04 NOTE — Telephone Encounter (Signed)
 Pharmacy Patient Advocate Encounter   Received notification from Pt Calls Messages that prior authorization for Freestyle lib[place] 3 plus sensor is required/requested.   Insurance verification completed.   The patient is insured through [facility].   Per test claim: Pt must be using insulin  for CGM to be approved. I do not see that patient meets this requirement. PA not submitted

## 2024-07-06 ENCOUNTER — Ambulatory Visit: Payer: Self-pay | Admitting: Internal Medicine

## 2024-07-06 NOTE — Telephone Encounter (Signed)
 How was this covered until now?  I do not think he changed his insurance... We may need to talk with the patient.  I am not sure if he did not pay out-of-pocket for this.

## 2024-07-07 ENCOUNTER — Telehealth: Payer: Self-pay

## 2024-07-07 NOTE — Telephone Encounter (Signed)
 Patient was identified as falling into the [place] Measure - Diabetes.   Patient was: Appointment already scheduled for:  12/30/2024 with Endocrinology Dr. Lela Fendt.

## 2024-07-08 NOTE — Telephone Encounter (Signed)
 I called and spoke with the patient, he states that someone made him aware that the insurance may not cover it but I told him the price for OOP and he states that he would pay $75 if that's what they are going to charge him. Also nothing with his insurance changed.

## 2024-07-14 ENCOUNTER — Encounter: Payer: Self-pay | Admitting: Podiatry

## 2024-07-14 ENCOUNTER — Ambulatory Visit: Admitting: Podiatry

## 2024-07-14 DIAGNOSIS — E1121 Type 2 diabetes mellitus with diabetic nephropathy: Secondary | ICD-10-CM | POA: Diagnosis not present

## 2024-07-14 DIAGNOSIS — M79674 Pain in right toe(s): Secondary | ICD-10-CM

## 2024-07-14 DIAGNOSIS — M79675 Pain in left toe(s): Secondary | ICD-10-CM

## 2024-07-14 DIAGNOSIS — B351 Tinea unguium: Secondary | ICD-10-CM

## 2024-07-19 NOTE — Progress Notes (Addendum)
  Subjective:  Patient ID: Billy Hansen, male    DOB: 10-Oct-1945,  MRN: [id number]  [name] presents to clinic today for at risk foot care. Pt has h/o NIDDM with chronic kidney disease and painful mycotic toenails of both feet that are difficult to trim. Pain interferes with daily activities and wearing enclosed shoe gear comfortably. Patient states he did purchase a pair of  sneakers as recommended. Chief Complaint  Patient presents with   Diabetes    DFC NIDDM A1C 8.3.Toenail trim. LOV with PCP 04/09/24.   New problem(s): None.   PCP is Merna Huxley, NP.  Allergies  Allergen Reactions   Januvia  [Sitagliptin ] Other (See Comments)    HA, fatigue   Naproxen Sodium Itching   Other     Some type of anesthetic following colonoscopy/ notes states Propofol    Review of Systems: Negative except as noted in the HPI.  Objective: No changes noted in today's physical examination. There were no vitals filed for this visit. Billy Hansen is a pleasant 78 y.o. male in NAD. AAO x 3.  Vascular Examination: Vascular status intact b/l with palpable pedal pulses. CFT immediate b/l. Pedal hair present. No edema. No pain with calf compression b/l. Skin temperature gradient WNL b/l. No varicosities noted. No cyanosis or clubbing noted.  Neurological Examination: Sensation grossly intact b/l with 10 gram monofilament.  Dermatological Examination: Pedal skin with normal turgor, texture and tone b/l. No open wounds nor interdigital macerations noted. Toenails 1-5 b/l thick, discolored, elongated with subungual debris and pain on dorsal palpation. No hyperkeratotic nor porokeratotic lesions present on today's visit.  Musculoskeletal Examination: Muscle strength 5/5 to b/l LE.  No pain, crepitus noted b/l. No gross pedal deformities. Patient ambulates independently without assistive aids.   Radiographs: None  Assessment/Plan: 1. Pain due to onychomycosis of toenails of both feet   2.  Type 2 diabetes mellitus with diabetic nephropathy, without long-term current use of insulin  [facility])   Consent given for treatment. Patient examined. All patient's and/or POA's questions/concerns addressed on today's visit. Mycotic toenails 1-5 b/l debrided in length and girth without incident. Continue foot and shoe inspections daily. Treatment was provided by assistant Andrez Manchester under my supervision. Monitor blood glucose per PCP/Endocrinologist's recommendations.Continue soft, supportive shoe gear daily. Report any pedal injuries to medical professional. Call office if there are any questions/concerns.  Return in about 3 months (around 10/14/2024).  Delon LITTIE Merlin, DPM       LOCATION: [address], [place] [address]                   Office [phone]   [facility] LOCATION: [address] [place], [place] [address] Office [phone]

## 2024-07-20 ENCOUNTER — Encounter: Payer: Self-pay | Admitting: Radiology

## 2024-10-07 ENCOUNTER — Other Ambulatory Visit: Payer: Self-pay | Admitting: Internal Medicine

## 2024-10-07 ENCOUNTER — Other Ambulatory Visit: Payer: Self-pay | Admitting: Adult Health

## 2024-10-07 DIAGNOSIS — E1121 Type 2 diabetes mellitus with diabetic nephropathy: Secondary | ICD-10-CM

## 2024-10-16 LAB — OPHTHALMOLOGY REPORT-SCANNED

## 2024-10-27 ENCOUNTER — Ambulatory Visit: Admitting: Podiatry

## 2024-12-30 ENCOUNTER — Ambulatory Visit: Admitting: Internal Medicine

## 2025-04-13 ENCOUNTER — Encounter: Admitting: Adult Health

## 2025-06-18 ENCOUNTER — Ambulatory Visit
